# Patient Record
Sex: Male | Born: 1935 | Race: White | Hispanic: No | Smoking: Former smoker
Health system: Southern US, Community
[De-identification: ages and names within clinical notes are randomized; demographics above are authoritative.]

## PROBLEM LIST (undated history)

## (undated) DIAGNOSIS — N189 Chronic kidney disease, unspecified: Secondary | ICD-10-CM

## (undated) DIAGNOSIS — I219 Acute myocardial infarction, unspecified: Secondary | ICD-10-CM

## (undated) DIAGNOSIS — I639 Cerebral infarction, unspecified: Secondary | ICD-10-CM

## (undated) DIAGNOSIS — I10 Essential (primary) hypertension: Secondary | ICD-10-CM

## (undated) DIAGNOSIS — K589 Irritable bowel syndrome without diarrhea: Secondary | ICD-10-CM

## (undated) DIAGNOSIS — E785 Hyperlipidemia, unspecified: Secondary | ICD-10-CM

## (undated) DIAGNOSIS — Z960 Presence of urogenital implants: Secondary | ICD-10-CM

## (undated) DIAGNOSIS — M1990 Unspecified osteoarthritis, unspecified site: Secondary | ICD-10-CM

## (undated) DIAGNOSIS — K219 Gastro-esophageal reflux disease without esophagitis: Secondary | ICD-10-CM

## (undated) DIAGNOSIS — N400 Enlarged prostate without lower urinary tract symptoms: Secondary | ICD-10-CM

## (undated) DIAGNOSIS — K5792 Diverticulitis of intestine, part unspecified, without perforation or abscess without bleeding: Secondary | ICD-10-CM

## (undated) DIAGNOSIS — I442 Atrioventricular block, complete: Secondary | ICD-10-CM

## (undated) DIAGNOSIS — F329 Major depressive disorder, single episode, unspecified: Secondary | ICD-10-CM

## (undated) DIAGNOSIS — I2510 Atherosclerotic heart disease of native coronary artery without angina pectoris: Secondary | ICD-10-CM

## (undated) HISTORY — DX: Acute myocardial infarction, unspecified: I21.9

## (undated) HISTORY — DX: Atherosclerotic heart disease of native coronary artery without angina pectoris: I25.10

## (undated) HISTORY — PX: INSERT / REPLACE / REMOVE PACEMAKER: SUR710

## (undated) HISTORY — DX: Unspecified osteoarthritis, unspecified site: M19.90

## (undated) HISTORY — DX: Diverticulitis of intestine, part unspecified, without perforation or abscess without bleeding: K57.92

## (undated) HISTORY — DX: Hyperlipidemia, unspecified: E78.5

## (undated) HISTORY — DX: Gastro-esophageal reflux disease without esophagitis: K21.9

## (undated) HISTORY — DX: Chronic kidney disease, unspecified: N18.9

## (undated) HISTORY — DX: Irritable bowel syndrome, unspecified: K58.9

## (undated) HISTORY — DX: Essential (primary) hypertension: I10

## (undated) HISTORY — DX: Cerebral infarction, unspecified: I63.9

## (undated) HISTORY — DX: Benign prostatic hyperplasia without lower urinary tract symptoms: N40.0

## (undated) HISTORY — PX: PACEMAKER INSERTION: SHX728

## (undated) HISTORY — DX: Major depressive disorder, single episode, unspecified: F32.9

---

## 1993-11-09 HISTORY — PX: ANGIOPLASTY: SHX39

## 1999-11-10 HISTORY — PX: DOPPLER ECHOCARDIOGRAPHY: SHX263

## 1999-12-07 ENCOUNTER — Inpatient Hospital Stay (HOSPITAL_COMMUNITY): Admission: EM | Admit: 1999-12-07 | Discharge: 1999-12-10 | Payer: Self-pay | Admitting: Cardiology

## 2000-01-15 ENCOUNTER — Ambulatory Visit (HOSPITAL_COMMUNITY): Admission: RE | Admit: 2000-01-15 | Discharge: 2000-01-15 | Payer: Self-pay | Admitting: Cardiology

## 2000-08-13 ENCOUNTER — Encounter: Payer: Self-pay | Admitting: Internal Medicine

## 2000-08-13 ENCOUNTER — Ambulatory Visit (HOSPITAL_COMMUNITY): Admission: RE | Admit: 2000-08-13 | Discharge: 2000-08-13 | Payer: Self-pay | Admitting: Internal Medicine

## 2003-07-11 HISTORY — PX: PROSTATE SURGERY: SHX751

## 2004-03-11 ENCOUNTER — Other Ambulatory Visit: Payer: Self-pay

## 2004-09-25 ENCOUNTER — Ambulatory Visit: Payer: Self-pay | Admitting: Internal Medicine

## 2004-10-03 ENCOUNTER — Ambulatory Visit: Payer: Self-pay

## 2004-10-28 ENCOUNTER — Ambulatory Visit: Payer: Self-pay | Admitting: Internal Medicine

## 2004-10-28 ENCOUNTER — Encounter: Payer: Self-pay | Admitting: Internal Medicine

## 2004-11-09 ENCOUNTER — Encounter: Payer: Self-pay | Admitting: Internal Medicine

## 2004-11-13 ENCOUNTER — Ambulatory Visit: Payer: Self-pay

## 2004-12-09 ENCOUNTER — Ambulatory Visit: Payer: Self-pay

## 2004-12-10 ENCOUNTER — Encounter: Payer: Self-pay | Admitting: Internal Medicine

## 2005-02-13 ENCOUNTER — Ambulatory Visit: Payer: Self-pay | Admitting: Internal Medicine

## 2005-03-20 ENCOUNTER — Ambulatory Visit: Payer: Self-pay | Admitting: Internal Medicine

## 2005-04-24 ENCOUNTER — Ambulatory Visit: Payer: Self-pay | Admitting: Internal Medicine

## 2005-05-29 ENCOUNTER — Ambulatory Visit: Payer: Self-pay | Admitting: Family Medicine

## 2005-06-01 ENCOUNTER — Ambulatory Visit: Payer: Self-pay | Admitting: Internal Medicine

## 2005-06-09 ENCOUNTER — Ambulatory Visit: Payer: Self-pay | Admitting: Internal Medicine

## 2005-07-03 ENCOUNTER — Ambulatory Visit: Payer: Self-pay | Admitting: Internal Medicine

## 2005-07-08 ENCOUNTER — Ambulatory Visit: Payer: Self-pay | Admitting: Internal Medicine

## 2005-07-10 HISTORY — PX: ESOPHAGOGASTRODUODENOSCOPY: SHX1529

## 2005-07-14 ENCOUNTER — Ambulatory Visit: Payer: Self-pay | Admitting: Internal Medicine

## 2005-07-23 ENCOUNTER — Ambulatory Visit: Payer: Self-pay | Admitting: Internal Medicine

## 2005-08-24 ENCOUNTER — Ambulatory Visit: Payer: Self-pay

## 2005-09-04 ENCOUNTER — Ambulatory Visit: Payer: Self-pay | Admitting: Internal Medicine

## 2005-09-24 ENCOUNTER — Ambulatory Visit: Payer: Self-pay | Admitting: Internal Medicine

## 2005-10-16 ENCOUNTER — Ambulatory Visit: Payer: Self-pay

## 2005-10-16 ENCOUNTER — Ambulatory Visit: Payer: Self-pay | Admitting: Internal Medicine

## 2005-11-18 ENCOUNTER — Ambulatory Visit: Payer: Self-pay | Admitting: Internal Medicine

## 2005-12-24 ENCOUNTER — Ambulatory Visit: Payer: Self-pay | Admitting: Internal Medicine

## 2006-03-05 ENCOUNTER — Ambulatory Visit: Payer: Self-pay | Admitting: Internal Medicine

## 2006-03-05 ENCOUNTER — Ambulatory Visit: Payer: Self-pay | Admitting: Cardiology

## 2006-04-01 ENCOUNTER — Ambulatory Visit: Payer: Self-pay | Admitting: Internal Medicine

## 2006-04-16 ENCOUNTER — Ambulatory Visit: Payer: Self-pay | Admitting: Internal Medicine

## 2006-04-29 ENCOUNTER — Ambulatory Visit: Payer: Self-pay | Admitting: Internal Medicine

## 2006-04-30 ENCOUNTER — Ambulatory Visit: Payer: Self-pay | Admitting: Internal Medicine

## 2006-05-04 ENCOUNTER — Ambulatory Visit: Payer: Self-pay | Admitting: Cardiology

## 2006-06-14 ENCOUNTER — Ambulatory Visit: Payer: Self-pay | Admitting: Internal Medicine

## 2006-07-09 ENCOUNTER — Ambulatory Visit: Payer: Self-pay | Admitting: Internal Medicine

## 2006-07-20 ENCOUNTER — Ambulatory Visit: Payer: Self-pay | Admitting: Internal Medicine

## 2006-08-02 ENCOUNTER — Ambulatory Visit: Payer: Self-pay

## 2006-08-04 ENCOUNTER — Ambulatory Visit: Payer: Self-pay | Admitting: Internal Medicine

## 2006-09-24 ENCOUNTER — Ambulatory Visit: Payer: Self-pay | Admitting: Internal Medicine

## 2006-10-07 ENCOUNTER — Ambulatory Visit: Payer: Self-pay | Admitting: Internal Medicine

## 2006-12-31 ENCOUNTER — Ambulatory Visit: Payer: Self-pay | Admitting: Internal Medicine

## 2007-03-15 ENCOUNTER — Ambulatory Visit: Payer: Self-pay | Admitting: Family Medicine

## 2007-03-16 ENCOUNTER — Encounter: Payer: Self-pay | Admitting: Family Medicine

## 2007-03-22 ENCOUNTER — Ambulatory Visit: Payer: Self-pay | Admitting: Family Medicine

## 2007-03-24 ENCOUNTER — Encounter: Payer: Self-pay | Admitting: Internal Medicine

## 2007-04-01 DIAGNOSIS — N401 Enlarged prostate with lower urinary tract symptoms: Secondary | ICD-10-CM

## 2007-04-01 DIAGNOSIS — E785 Hyperlipidemia, unspecified: Secondary | ICD-10-CM

## 2007-04-01 DIAGNOSIS — K5730 Diverticulosis of large intestine without perforation or abscess without bleeding: Secondary | ICD-10-CM | POA: Insufficient documentation

## 2007-04-01 DIAGNOSIS — K219 Gastro-esophageal reflux disease without esophagitis: Secondary | ICD-10-CM | POA: Insufficient documentation

## 2007-04-01 DIAGNOSIS — I25119 Atherosclerotic heart disease of native coronary artery with unspecified angina pectoris: Secondary | ICD-10-CM

## 2007-04-01 DIAGNOSIS — N138 Other obstructive and reflux uropathy: Secondary | ICD-10-CM

## 2007-04-01 DIAGNOSIS — I10 Essential (primary) hypertension: Secondary | ICD-10-CM | POA: Insufficient documentation

## 2007-04-04 DIAGNOSIS — E1129 Type 2 diabetes mellitus with other diabetic kidney complication: Secondary | ICD-10-CM

## 2007-04-05 ENCOUNTER — Ambulatory Visit: Payer: Self-pay | Admitting: Internal Medicine

## 2007-04-07 ENCOUNTER — Ambulatory Visit: Payer: Self-pay | Admitting: Internal Medicine

## 2007-04-07 LAB — CONVERTED CEMR LAB
Cholesterol, target level: 200 mg/dL
HDL goal, serum: 40 mg/dL
LDL Goal: 70 mg/dL

## 2007-04-14 ENCOUNTER — Ambulatory Visit: Payer: Self-pay | Admitting: Internal Medicine

## 2007-05-19 ENCOUNTER — Ambulatory Visit: Payer: Self-pay | Admitting: Internal Medicine

## 2007-05-19 DIAGNOSIS — K589 Irritable bowel syndrome without diarrhea: Secondary | ICD-10-CM

## 2007-06-09 ENCOUNTER — Encounter: Payer: Self-pay | Admitting: Internal Medicine

## 2007-06-13 ENCOUNTER — Ambulatory Visit: Payer: Self-pay

## 2007-06-22 ENCOUNTER — Ambulatory Visit: Payer: Self-pay | Admitting: Internal Medicine

## 2007-06-30 ENCOUNTER — Encounter: Payer: Self-pay | Admitting: Internal Medicine

## 2007-07-26 ENCOUNTER — Ambulatory Visit: Payer: Self-pay | Admitting: Internal Medicine

## 2007-08-30 ENCOUNTER — Encounter: Payer: Self-pay | Admitting: Internal Medicine

## 2007-09-02 ENCOUNTER — Ambulatory Visit: Payer: Self-pay

## 2007-09-12 ENCOUNTER — Ambulatory Visit: Payer: Self-pay | Admitting: Family Medicine

## 2007-11-15 ENCOUNTER — Ambulatory Visit: Payer: Self-pay | Admitting: Internal Medicine

## 2007-12-05 ENCOUNTER — Ambulatory Visit: Payer: Self-pay | Admitting: Internal Medicine

## 2007-12-05 LAB — CONVERTED CEMR LAB
Calcium: 9.5 mg/dL (ref 8.4–10.5)
Chloride: 99 meq/L (ref 96–112)
Cholesterol: 157 mg/dL (ref 0–200)
Direct LDL: 76.6 mg/dL
GFR calc Af Amer: 93 mL/min
Glucose, Bld: 228 mg/dL — ABNORMAL HIGH (ref 70–99)
Microalb Creat Ratio: 263.3 mg/g — ABNORMAL HIGH (ref 0.0–30.0)
Microalb, Ur: 9.9 mg/dL — ABNORMAL HIGH (ref 0.0–1.9)
VLDL: 56 mg/dL — ABNORMAL HIGH (ref 0–40)

## 2007-12-06 LAB — CONVERTED CEMR LAB
Albumin: 4 g/dL (ref 3.5–5.2)
BUN: 12 mg/dL (ref 6–23)
Creatinine, Ser: 1.1 mg/dL (ref 0.4–1.5)
GFR calc Af Amer: 83 mL/min
GFR calc non Af Amer: 69 mL/min
Hgb A1c MFr Bld: 6.3 % — ABNORMAL HIGH (ref 4.6–6.0)
Phosphorus: 3.2 mg/dL (ref 2.3–4.6)
Potassium: 4.9 meq/L (ref 3.5–5.1)
Total CHOL/HDL Ratio: 5.8
Triglycerides: 252 mg/dL (ref 0–149)

## 2008-02-02 ENCOUNTER — Ambulatory Visit: Payer: Self-pay

## 2008-03-05 ENCOUNTER — Ambulatory Visit: Payer: Self-pay | Admitting: Internal Medicine

## 2008-03-15 ENCOUNTER — Encounter: Payer: Self-pay | Admitting: Internal Medicine

## 2008-03-16 ENCOUNTER — Ambulatory Visit: Payer: Self-pay | Admitting: Unknown Physician Specialty

## 2008-03-16 ENCOUNTER — Encounter: Payer: Self-pay | Admitting: Internal Medicine

## 2008-03-16 LAB — HM COLONOSCOPY

## 2008-03-26 ENCOUNTER — Ambulatory Visit: Payer: Self-pay | Admitting: Internal Medicine

## 2008-04-17 ENCOUNTER — Encounter: Payer: Self-pay | Admitting: Internal Medicine

## 2008-04-20 ENCOUNTER — Ambulatory Visit: Payer: Self-pay | Admitting: Family Medicine

## 2008-04-24 ENCOUNTER — Telehealth: Payer: Self-pay | Admitting: Internal Medicine

## 2008-04-24 ENCOUNTER — Encounter: Payer: Self-pay | Admitting: Internal Medicine

## 2008-04-26 ENCOUNTER — Ambulatory Visit: Payer: Self-pay | Admitting: Unknown Physician Specialty

## 2008-04-26 ENCOUNTER — Encounter: Payer: Self-pay | Admitting: Internal Medicine

## 2008-06-01 ENCOUNTER — Ambulatory Visit: Payer: Self-pay | Admitting: Internal Medicine

## 2008-06-01 DIAGNOSIS — L570 Actinic keratosis: Secondary | ICD-10-CM

## 2008-06-04 ENCOUNTER — Ambulatory Visit: Payer: Self-pay | Admitting: Internal Medicine

## 2008-07-04 ENCOUNTER — Encounter: Payer: Self-pay | Admitting: Internal Medicine

## 2008-07-30 ENCOUNTER — Ambulatory Visit: Payer: Self-pay | Admitting: Internal Medicine

## 2008-08-10 ENCOUNTER — Ambulatory Visit: Payer: Self-pay | Admitting: Family Medicine

## 2008-08-17 ENCOUNTER — Emergency Department: Payer: Self-pay | Admitting: Emergency Medicine

## 2008-08-17 ENCOUNTER — Other Ambulatory Visit: Payer: Self-pay

## 2008-08-17 ENCOUNTER — Encounter: Payer: Self-pay | Admitting: Internal Medicine

## 2008-08-24 ENCOUNTER — Ambulatory Visit: Payer: Self-pay | Admitting: Internal Medicine

## 2008-09-17 ENCOUNTER — Ambulatory Visit: Payer: Self-pay | Admitting: Internal Medicine

## 2008-10-03 ENCOUNTER — Encounter: Payer: Self-pay | Admitting: Internal Medicine

## 2008-10-03 ENCOUNTER — Ambulatory Visit: Payer: Self-pay

## 2008-10-22 ENCOUNTER — Ambulatory Visit: Payer: Self-pay | Admitting: Internal Medicine

## 2008-10-23 ENCOUNTER — Ambulatory Visit: Payer: Self-pay | Admitting: Internal Medicine

## 2008-11-12 ENCOUNTER — Ambulatory Visit: Payer: Self-pay | Admitting: Internal Medicine

## 2008-11-19 ENCOUNTER — Ambulatory Visit: Payer: Self-pay | Admitting: Internal Medicine

## 2008-11-21 LAB — CONVERTED CEMR LAB
ALT: 13 units/L (ref 0–53)
AST: 19 units/L (ref 0–37)
BUN: 13 mg/dL (ref 6–23)
Bilirubin, Direct: 0.1 mg/dL (ref 0.0–0.3)
Calcium: 9.4 mg/dL (ref 8.4–10.5)
Direct LDL: 93.9 mg/dL
Eosinophils Relative: 2.4 % (ref 0.0–5.0)
GFR calc Af Amer: 83 mL/min
HCT: 36.3 % — ABNORMAL LOW (ref 39.0–52.0)
Hemoglobin: 12.8 g/dL — ABNORMAL LOW (ref 13.0–17.0)
Monocytes Absolute: 0.5 10*3/uL (ref 0.1–1.0)
Monocytes Relative: 8.1 % (ref 3.0–12.0)
Neutro Abs: 3.9 10*3/uL (ref 1.4–7.7)
Phosphorus: 3.1 mg/dL (ref 2.3–4.6)
Potassium: 4.8 meq/L (ref 3.5–5.1)
RBC: 3.78 M/uL — ABNORMAL LOW (ref 4.22–5.81)
Sodium: 140 meq/L (ref 135–145)
Total CHOL/HDL Ratio: 5.9
Total Protein: 7 g/dL (ref 6.0–8.3)
WBC: 6.6 10*3/uL (ref 4.5–10.5)

## 2008-12-31 ENCOUNTER — Ambulatory Visit: Payer: Self-pay | Admitting: Internal Medicine

## 2008-12-31 ENCOUNTER — Telehealth: Payer: Self-pay | Admitting: Internal Medicine

## 2008-12-31 LAB — CONVERTED CEMR LAB
Nitrite: NEGATIVE
Protein, U semiquant: 30
pH: 6.5

## 2009-01-02 ENCOUNTER — Telehealth: Payer: Self-pay | Admitting: Internal Medicine

## 2009-01-03 ENCOUNTER — Encounter: Payer: Self-pay | Admitting: Internal Medicine

## 2009-01-03 ENCOUNTER — Inpatient Hospital Stay: Payer: Self-pay | Admitting: Internal Medicine

## 2009-01-05 ENCOUNTER — Encounter: Payer: Self-pay | Admitting: Internal Medicine

## 2009-01-07 LAB — HM DIABETES EYE EXAM

## 2009-01-09 ENCOUNTER — Telehealth: Payer: Self-pay | Admitting: Internal Medicine

## 2009-01-10 ENCOUNTER — Telehealth: Payer: Self-pay | Admitting: Internal Medicine

## 2009-01-14 ENCOUNTER — Ambulatory Visit: Payer: Self-pay | Admitting: Internal Medicine

## 2009-01-14 DIAGNOSIS — I6990 Unspecified sequelae of unspecified cerebrovascular disease: Secondary | ICD-10-CM

## 2009-01-16 ENCOUNTER — Encounter: Payer: Self-pay | Admitting: Internal Medicine

## 2009-01-17 ENCOUNTER — Encounter: Payer: Self-pay | Admitting: Internal Medicine

## 2009-01-21 ENCOUNTER — Ambulatory Visit: Payer: Self-pay | Admitting: Internal Medicine

## 2009-01-25 ENCOUNTER — Ambulatory Visit: Payer: Self-pay | Admitting: Vascular Surgery

## 2009-01-30 ENCOUNTER — Encounter: Payer: Self-pay | Admitting: Internal Medicine

## 2009-02-05 ENCOUNTER — Encounter: Payer: Self-pay | Admitting: Internal Medicine

## 2009-02-06 ENCOUNTER — Encounter: Payer: Self-pay | Admitting: Internal Medicine

## 2009-02-07 ENCOUNTER — Ambulatory Visit: Payer: Self-pay | Admitting: Vascular Surgery

## 2009-02-11 ENCOUNTER — Ambulatory Visit: Payer: Self-pay | Admitting: Internal Medicine

## 2009-02-13 LAB — CONVERTED CEMR LAB
ALT: 14 units/L (ref 0–53)
Basophils Absolute: 0 10*3/uL (ref 0.0–0.1)
Basophils Relative: 0.6 % (ref 0.0–3.0)
Bilirubin, Direct: 0.1 mg/dL (ref 0.0–0.3)
Calcium: 9.9 mg/dL (ref 8.4–10.5)
Direct LDL: 87.8 mg/dL
Eosinophils Relative: 2.4 % (ref 0.0–5.0)
GFR calc non Af Amer: 68.3 mL/min (ref >60)
Glucose, Bld: 237 mg/dL — ABNORMAL HIGH (ref 70–99)
HCT: 37 % — ABNORMAL LOW (ref 39.0–52.0)
HDL: 29 mg/dL — ABNORMAL LOW (ref >39.00)
Hemoglobin: 12.7 g/dL — ABNORMAL LOW (ref 13.0–17.0)
Lymphocytes Relative: 24.3 % (ref 12.0–46.0)
Lymphs Abs: 1.8 10*3/uL (ref 0.7–4.0)
Monocytes Relative: 5.9 % (ref 3.0–12.0)
Neutro Abs: 5.1 10*3/uL (ref 1.4–7.7)
Potassium: 5.4 meq/L — ABNORMAL HIGH (ref 3.5–5.1)
RBC: 3.82 M/uL — ABNORMAL LOW (ref 4.22–5.81)
Sodium: 139 meq/L (ref 135–145)
Total Bilirubin: 0.7 mg/dL (ref 0.3–1.2)
Triglycerides: 305 mg/dL — ABNORMAL HIGH (ref 0.0–149.0)
VLDL: 61 mg/dL — ABNORMAL HIGH (ref 0.0–40.0)
WBC: 7.5 10*3/uL (ref 4.5–10.5)

## 2009-02-14 ENCOUNTER — Inpatient Hospital Stay: Payer: Self-pay | Admitting: Vascular Surgery

## 2009-02-14 ENCOUNTER — Encounter: Payer: Self-pay | Admitting: Internal Medicine

## 2009-02-15 ENCOUNTER — Encounter: Payer: Self-pay | Admitting: Internal Medicine

## 2009-02-25 ENCOUNTER — Ambulatory Visit: Payer: Self-pay | Admitting: Internal Medicine

## 2009-02-25 LAB — CONVERTED CEMR LAB: Potassium: 5 meq/L (ref 3.5–5.1)

## 2009-04-03 DIAGNOSIS — I442 Atrioventricular block, complete: Secondary | ICD-10-CM

## 2009-04-16 ENCOUNTER — Encounter: Payer: Self-pay | Admitting: Internal Medicine

## 2009-04-17 ENCOUNTER — Ambulatory Visit: Payer: Self-pay | Admitting: Internal Medicine

## 2009-04-18 DIAGNOSIS — M479 Spondylosis, unspecified: Secondary | ICD-10-CM | POA: Insufficient documentation

## 2009-04-18 LAB — CONVERTED CEMR LAB
Alkaline Phosphatase: 81 units/L (ref 39–117)
Basophils Relative: 0.1 % (ref 0.0–3.0)
Bilirubin, Direct: 0.1 mg/dL (ref 0.0–0.3)
Chloride: 106 meq/L (ref 96–112)
Eosinophils Absolute: 0.2 10*3/uL (ref 0.0–0.7)
Glucose, Bld: 285 mg/dL — ABNORMAL HIGH (ref 70–99)
Lymphocytes Relative: 11.5 % — ABNORMAL LOW (ref 12.0–46.0)
MCHC: 34.9 g/dL (ref 30.0–36.0)
MCV: 95.7 fL (ref 78.0–100.0)
Monocytes Absolute: 0.7 10*3/uL (ref 0.1–1.0)
Neutrophils Relative %: 80 % — ABNORMAL HIGH (ref 43.0–77.0)
Phosphorus: 3.3 mg/dL (ref 2.3–4.6)
Platelets: 200 10*3/uL (ref 150.0–400.0)
Potassium: 4.8 meq/L (ref 3.5–5.1)
RBC: 4.02 M/uL — ABNORMAL LOW (ref 4.22–5.81)
Sodium: 141 meq/L (ref 135–145)
TSH: 0.81 microintl units/mL (ref 0.35–5.50)
Total Bilirubin: 0.6 mg/dL (ref 0.3–1.2)
Total CK: 53 units/L (ref 7–232)
WBC: 11.2 10*3/uL — ABNORMAL HIGH (ref 4.5–10.5)

## 2009-05-20 ENCOUNTER — Encounter: Payer: Self-pay | Admitting: Internal Medicine

## 2009-05-20 ENCOUNTER — Ambulatory Visit: Payer: Self-pay | Admitting: Internal Medicine

## 2009-06-24 ENCOUNTER — Ambulatory Visit: Payer: Self-pay | Admitting: Internal Medicine

## 2009-06-24 ENCOUNTER — Telehealth: Payer: Self-pay | Admitting: Internal Medicine

## 2009-06-24 DIAGNOSIS — R5381 Other malaise: Secondary | ICD-10-CM

## 2009-06-24 DIAGNOSIS — R5383 Other fatigue: Secondary | ICD-10-CM

## 2009-06-25 LAB — CONVERTED CEMR LAB
ALT: 9 units/L (ref 0–53)
Albumin: 4.1 g/dL (ref 3.5–5.2)
BUN: 36 mg/dL — ABNORMAL HIGH (ref 6–23)
CO2: 31 meq/L (ref 19–32)
Calcium: 9.8 mg/dL (ref 8.4–10.5)
Chloride: 98 meq/L (ref 96–112)
Creatinine, Ser: 1.5 mg/dL (ref 0.4–1.5)
Eosinophils Absolute: 0.2 10*3/uL (ref 0.0–0.7)
Eosinophils Relative: 2.4 % (ref 0.0–5.0)
Free T4: 0.8 ng/dL (ref 0.6–1.6)
HCT: 38.2 % — ABNORMAL LOW (ref 39.0–52.0)
Lymphs Abs: 1.8 10*3/uL (ref 0.7–4.0)
MCHC: 33.9 g/dL (ref 30.0–36.0)
MCV: 95.1 fL (ref 78.0–100.0)
Monocytes Absolute: 0.5 10*3/uL (ref 0.1–1.0)
Platelets: 181 10*3/uL (ref 150.0–400.0)
RDW: 13.1 % (ref 11.5–14.6)
Sed Rate: 32 mm/hr — ABNORMAL HIGH (ref 0–22)
Total Bilirubin: 0.8 mg/dL (ref 0.3–1.2)
WBC: 8 10*3/uL (ref 4.5–10.5)

## 2009-07-09 ENCOUNTER — Ambulatory Visit: Payer: Self-pay | Admitting: Internal Medicine

## 2009-07-29 ENCOUNTER — Inpatient Hospital Stay: Payer: Self-pay | Admitting: Internal Medicine

## 2009-07-31 ENCOUNTER — Encounter: Payer: Self-pay | Admitting: Internal Medicine

## 2009-08-07 ENCOUNTER — Ambulatory Visit: Payer: Self-pay | Admitting: Internal Medicine

## 2009-08-15 ENCOUNTER — Ambulatory Visit: Payer: Self-pay | Admitting: Internal Medicine

## 2009-08-26 ENCOUNTER — Ambulatory Visit: Payer: Self-pay | Admitting: Internal Medicine

## 2009-09-05 ENCOUNTER — Ambulatory Visit: Payer: Self-pay | Admitting: Internal Medicine

## 2009-11-06 ENCOUNTER — Encounter: Payer: Self-pay | Admitting: Internal Medicine

## 2009-11-21 ENCOUNTER — Ambulatory Visit: Payer: Self-pay | Admitting: Internal Medicine

## 2009-12-06 ENCOUNTER — Ambulatory Visit: Payer: Self-pay | Admitting: Internal Medicine

## 2009-12-09 ENCOUNTER — Telehealth: Payer: Self-pay | Admitting: Internal Medicine

## 2009-12-09 LAB — CONVERTED CEMR LAB
ALT: 9 units/L (ref 0–53)
AST: 17 units/L (ref 0–37)
Chloride: 99 meq/L (ref 96–112)
Creatinine, Ser: 1.11 mg/dL (ref 0.40–1.50)
Hgb A1c MFr Bld: 6.5 % — ABNORMAL HIGH (ref 4.6–6.1)
Sodium: 140 meq/L (ref 135–145)
TSH: 1.791 microintl units/mL (ref 0.350–4.500)
Total Bilirubin: 0.5 mg/dL (ref 0.3–1.2)
Total CHOL/HDL Ratio: 4.8
VLDL: 71 mg/dL — ABNORMAL HIGH (ref 0–40)

## 2010-02-12 ENCOUNTER — Inpatient Hospital Stay: Payer: Self-pay | Admitting: Internal Medicine

## 2010-02-17 ENCOUNTER — Encounter: Payer: Self-pay | Admitting: Internal Medicine

## 2010-02-20 ENCOUNTER — Telehealth: Payer: Self-pay | Admitting: Internal Medicine

## 2010-03-04 ENCOUNTER — Encounter: Payer: Self-pay | Admitting: Internal Medicine

## 2010-03-06 ENCOUNTER — Encounter: Payer: Self-pay | Admitting: Internal Medicine

## 2010-03-27 ENCOUNTER — Encounter: Payer: Self-pay | Admitting: Internal Medicine

## 2010-04-24 ENCOUNTER — Ambulatory Visit: Payer: Self-pay | Admitting: Internal Medicine

## 2010-04-24 DIAGNOSIS — H01009 Unspecified blepharitis unspecified eye, unspecified eyelid: Secondary | ICD-10-CM | POA: Insufficient documentation

## 2010-04-24 DIAGNOSIS — L219 Seborrheic dermatitis, unspecified: Secondary | ICD-10-CM

## 2010-04-28 LAB — CONVERTED CEMR LAB
Albumin: 4.5 g/dL (ref 3.5–5.2)
BUN: 19 mg/dL (ref 6–23)
Basophils Absolute: 0 10*3/uL (ref 0.0–0.1)
Creatinine, Ser: 1.2 mg/dL (ref 0.4–1.5)
Eosinophils Absolute: 0.4 10*3/uL (ref 0.0–0.7)
Glucose, Bld: 172 mg/dL — ABNORMAL HIGH (ref 70–99)
Lymphocytes Relative: 24 % (ref 12.0–46.0)
MCHC: 34.3 g/dL (ref 30.0–36.0)
Neutro Abs: 4.9 10*3/uL (ref 1.4–7.7)
Neutrophils Relative %: 64.3 % (ref 43.0–77.0)
Phosphorus: 2.5 mg/dL (ref 2.3–4.6)
RDW: 13.5 % (ref 11.5–14.6)

## 2010-05-02 ENCOUNTER — Ambulatory Visit: Payer: Self-pay | Admitting: Internal Medicine

## 2010-05-20 ENCOUNTER — Encounter: Payer: Self-pay | Admitting: Internal Medicine

## 2010-05-20 ENCOUNTER — Telehealth: Payer: Self-pay | Admitting: Internal Medicine

## 2010-06-04 ENCOUNTER — Ambulatory Visit: Payer: Self-pay | Admitting: Internal Medicine

## 2010-06-17 ENCOUNTER — Ambulatory Visit: Payer: Self-pay | Admitting: Otolaryngology

## 2010-06-27 ENCOUNTER — Telehealth: Payer: Self-pay | Admitting: Internal Medicine

## 2010-08-07 ENCOUNTER — Ambulatory Visit: Payer: Self-pay | Admitting: Internal Medicine

## 2010-08-07 DIAGNOSIS — J069 Acute upper respiratory infection, unspecified: Secondary | ICD-10-CM | POA: Insufficient documentation

## 2010-08-27 ENCOUNTER — Encounter: Payer: Self-pay | Admitting: Internal Medicine

## 2010-09-01 ENCOUNTER — Ambulatory Visit: Payer: Self-pay | Admitting: Internal Medicine

## 2010-09-03 LAB — CONVERTED CEMR LAB
ALT: 10 units/L (ref 0–53)
AST: 21 units/L (ref 0–37)
Basophils Absolute: 0.1 10*3/uL (ref 0.0–0.1)
Basophils Relative: 1.5 % (ref 0.0–3.0)
Calcium: 9.6 mg/dL (ref 8.4–10.5)
Cholesterol: 161 mg/dL (ref 0–200)
Creatinine, Ser: 1.3 mg/dL (ref 0.4–1.5)
Direct LDL: 80.8 mg/dL
Eosinophils Absolute: 0.2 10*3/uL (ref 0.0–0.7)
Glucose, Bld: 190 mg/dL — ABNORMAL HIGH (ref 70–99)
Lymphocytes Relative: 28.8 % (ref 12.0–46.0)
MCHC: 34.3 g/dL (ref 30.0–36.0)
Neutrophils Relative %: 60.4 % (ref 43.0–77.0)
Phosphorus: 2.8 mg/dL (ref 2.3–4.6)
Platelets: 141 10*3/uL — ABNORMAL LOW (ref 150.0–400.0)
RBC: 3.86 M/uL — ABNORMAL LOW (ref 4.22–5.81)
RDW: 14.5 % (ref 11.5–14.6)
Sodium: 137 meq/L (ref 135–145)
TSH: 1.18 microintl units/mL (ref 0.35–5.50)
Total Bilirubin: 0.3 mg/dL (ref 0.3–1.2)
Triglycerides: 352 mg/dL — ABNORMAL HIGH (ref 0.0–149.0)

## 2010-09-05 ENCOUNTER — Encounter: Payer: Self-pay | Admitting: Internal Medicine

## 2010-11-05 ENCOUNTER — Encounter (INDEPENDENT_AMBULATORY_CARE_PROVIDER_SITE_OTHER): Payer: Self-pay | Admitting: *Deleted

## 2010-11-09 DIAGNOSIS — F32A Depression, unspecified: Secondary | ICD-10-CM

## 2010-11-09 HISTORY — DX: Depression, unspecified: F32.A

## 2010-11-20 ENCOUNTER — Telehealth (INDEPENDENT_AMBULATORY_CARE_PROVIDER_SITE_OTHER): Payer: Self-pay | Admitting: Physician Assistant

## 2010-11-20 ENCOUNTER — Telehealth (INDEPENDENT_AMBULATORY_CARE_PROVIDER_SITE_OTHER): Payer: Self-pay | Admitting: *Deleted

## 2010-12-01 ENCOUNTER — Ambulatory Visit
Admission: RE | Admit: 2010-12-01 | Discharge: 2010-12-01 | Payer: Self-pay | Source: Home / Self Care | Attending: Internal Medicine | Admitting: Internal Medicine

## 2010-12-01 ENCOUNTER — Encounter: Payer: Self-pay | Admitting: Internal Medicine

## 2010-12-01 DIAGNOSIS — I6350 Cerebral infarction due to unspecified occlusion or stenosis of unspecified cerebral artery: Secondary | ICD-10-CM | POA: Insufficient documentation

## 2010-12-01 LAB — CONVERTED CEMR LAB
BUN: 16 mg/dL (ref 6–23)
Calcium: 9.6 mg/dL (ref 8.4–10.5)
Chloride: 101 meq/L (ref 96–112)
Creatinine, Ser: 1.13 mg/dL (ref 0.40–1.50)

## 2010-12-03 ENCOUNTER — Ambulatory Visit: Payer: Self-pay | Admitting: Internal Medicine

## 2010-12-03 ENCOUNTER — Encounter: Payer: Self-pay | Admitting: Internal Medicine

## 2010-12-05 ENCOUNTER — Ambulatory Visit
Admission: RE | Admit: 2010-12-05 | Discharge: 2010-12-05 | Payer: Self-pay | Source: Home / Self Care | Attending: Internal Medicine | Admitting: Internal Medicine

## 2010-12-05 LAB — HM DIABETES FOOT EXAM

## 2010-12-09 ENCOUNTER — Telehealth (INDEPENDENT_AMBULATORY_CARE_PROVIDER_SITE_OTHER): Payer: Self-pay | Admitting: *Deleted

## 2010-12-09 NOTE — Letter (Signed)
Summary: [facility]  [facility]   Imported By: Lanelle Bal 11/22/2009 14:07:37  _____________________________________________________________________  External Attachment:    Type:   Image     Comment:   External Document  Appended Document: [facility] doing well after left CEA

## 2010-12-09 NOTE — Assessment & Plan Note (Signed)
Summary: SPOT BEHIND EAR/DLO   Vital Signs:  Patient profile:   77 year old male Height:      67 inches Weight:      135.25 pounds BMI:     21.26 Temp:     98 degrees F oral Pulse rate:   64 / minute Pulse rhythm:   regular BP sitting:   150 / 76  (left arm) Cuff size:   regular  Vitals Entered By: Lewanda Rife LPN (April 24, 2010 2:46 PM) CC: spot behind left ear   History of Present Illness: Hospitalized in April for urinary retention then went to rehab--[facility] Eventually able to void better---doing okay now still with some hesitancy---and doesn't empty well  New area behind left ear May just be dry skin noticed about 1 week ago thinks it looks like dry skin trying soap and cleaning and peroxide not sure if it is helping some itching  bowels moving "fair" no blood  No heart problems no chest pain  No SOB No sig edema  checks sugars daily in spells run 87-189 Most under 140 No sig hypoglycemia  Allergies (verified): No Known Drug Allergies  Past History:  Past medical, surgical, family and social histories (including risk factors) reviewed for relevance to current acute and chronic problems.  Past Medical History: Reviewed history from 06/24/2009 and no changes required. Coronary artery disease-----------------------------------------Dr Graciela Husbands Diverticulosis, colon GERD Hyperlipidemia Hypertension Benign prostatic hypertrophy---------------------------------Dr Artis Flock NIDDM with nephropathy DJD--lumbar spine Irritable bowel syndrome CVA--2/10  Past Surgical History: Reviewed history from 08/07/2009 and no changes required. Angioplasty  1995 Syncope- echo nl (LV EF  55%) 11/1999 Pacer/cath  (ef 65%)  1/01 -  guidant Discovery II DR pulse generator Abd pain 09/2002  H  pylori positive 05/2002 TUNA (Dr Aldean Ast) 07/2003 EGD neg. 07/2005 ABIs normal 10/2005  Family History: Reviewed history from 02/11/2009 and no changes required. Mom died of  old age Dad died of old age 37 brother died of accident Sister with DM  Social History: Reviewed history from 12/31/2008 and no changes required. Widowed--5 children Lives with son and DIL Former Smoker Alcohol use-no--heavy in past Retired--drove Merchant navy officer for [employer]  Review of Systems       weight fairly stable sleeping okay Gets redness in eyes and glaze along eyelid  Physical Exam  General:  alert.  NAD Eyes:  right lower lid slightly everted with mild redness and inflammation Neck:  supple and no masses.   Lungs:  normal respiratory effort and normal breath sounds.   Heart:  normal rate, regular rhythm, no murmur, and no gallop.   Abdomen:  soft and non-tender.   Skin:  scaling with redness behind left ear no scalp issues Psych:  normally interactive, good eye contact, not anxious appearing, and not depressed appearing.     Impression & Recommendations:  Problem # 1:  SEBORRHEIC DERMATITIS (ICD-690.10) Assessment New will use steroid cream  Problem # 2:  BLEPHARITIS (ICD-373.00) Assessment: New bleph 10 ointment to eye doctor if persists  Problem # 3:  DIABETES MELLITUS, TYPE II, WITH RENAL COMPLICATIONS (ICD-250.40) Assessment: Unchanged  will check labs  His updated medication list for this problem includes:    Lotensin 20 Mg Tabs (Benazepril hcl) .Marland Kitchen... 1 tab daily for high blood pressure    Glucotrol Xl 10 Mg Tb24 (Glipizide) .Marland Kitchen... 1 by mouth qam    Aspirin 81 Mg Tbec (Aspirin) .Marland Kitchen... 1 by mouth daily    Glucophage 37000 Mg Tabs (Metformin hcl) .Marland KitchenMarland KitchenMarland KitchenMarland Kitchen  1 by mouth two times a day  Labs Reviewed: Creat: 1.11 (12/06/2009)     Last Eye Exam: no retinopathy (01/07/2009) Reviewed HgBA1c results: 6.5 (12/06/2009)  6.4 (02/11/2009)  Orders: TLB-A1C / Hgb A1C (Glycohemoglobin) (83036-A1C)  Problem # 4:  HYPERTENSION (ICD-401.9) Assessment: Unchanged  BP has been okay due for labs  His updated medication list for this problem includes:     Lotensin 20 Mg Tabs (Benazepril hcl) .Marland Kitchen... 1 tab daily for high blood pressure    Atenolol 50 Mg Tabs (Atenolol) .Marland Kitchen... 1 daily  BP today: 150/76 Prior BP: 168/80 (12/06/2009)  Prior 10 Yr Risk Heart Disease: N/A (04/07/2007)  Labs Reviewed: K+: 4.2 (12/06/2009) Creat: : 1.11 (12/06/2009)   Chol: 178 (12/06/2009)   HDL: 37 (12/06/2009)   LDL: 70 (12/06/2009)   TG: 356 (12/06/2009)  Orders: TLB-Renal Function Panel (80069-RENAL) TLB-CBC Platelet - w/Differential (85025-CBCD) Venipuncture (16109)  Problem # 5:  BENIGN PROSTATIC HYPERTROPHY (ICD-600.00) Assessment: Unchanged follows with Dr Artis Flock  Problem # 6:  CEREBROVASCULAR ACCIDENT, LATE EFFECTS (ICD-438.9) Assessment: Comment Only handicapped permit request signed  His updated medication list for this problem includes:    Aspirin 81 Mg Tbec (Aspirin) .Marland Kitchen... 1 by mouth daily    Plavix 75 Mg Tabs (Clopidogrel bisulfate) .Marland Kitchen... Take 1 tablet by mouth once daily  Complete Medication List: 1)  Tamsulosin Hcl 0.4 Mg Caps (Tamsulosin hcl) .Marland Kitchen.. 1 cap daily to ease urination 2)  Lotensin 20 Mg Tabs (Benazepril hcl) .Marland Kitchen.. 1 tab daily for high blood pressure 3)  Glucotrol Xl 10 Mg Tb24 (Glipizide) .Marland Kitchen.. 1 by mouth qam 4)  Aspirin 81 Mg Tbec (Aspirin) .Marland Kitchen.. 1 by mouth daily 5)  Glucophage 1000 Mg Tabs (Metformin hcl) .Marland Kitchen.. 1 by mouth two times a day 6)  Pravachol 20 Mg Tabs (Pravastatin sodium) .Marland Kitchen.. 1 by mouth daily 7)  Imdur 30 Mg Tb24 (Isosorbide mononitrate) .Marland Kitchen.. 1 by mouth daily 8)  Miralax Powd (Polyethylene glycol 3350) .... As needed 9)  Metamucil Caps (Psyllium caps) .Marland Kitchen.. 1 tbl spon daily 10)  Finasteride 5 Mg Tabs (Finasteride) .Marland Kitchen.. 1 by mouth daily 11)  Omeprazole 20 Mg Cpdr (Omeprazole) .Marland Kitchen.. 1 before breakfast and 1 at bedtime 12)  Atenolol 50 Mg Tabs (Atenolol) .Marland Kitchen.. 1 daily 13)  Clarinex 5 Mg Tabs (Desloratadine) .Marland Kitchen.. 1 once daily as needed by mouth 14)  Lorazepam 0.5 Mg Tabs (Lorazepam) .Marland Kitchen.. 1-2  at bedtime to help sleep and  abdominal pain 15)  Plavix 75 Mg Tabs (Clopidogrel bisulfate) .... Take 1 tablet by mouth once daily 16)  Colace 100 Mg Caps (Docusate sodium) .... Take 1 capsule by mouth two times a day 17)  Senna S 8.6-50 Mg Tabs (Sennosides-docusate sodium) .... 2 tabs by mouth daily as needed if no stool in 24 hours 18)  Magnesium Citrate 1.745 Gm/22ml Soln (Magnesium citrate) .... 4-8 ounces by mouth every 3rd day if no stool 19)  Multivitamins Tabs (Multiple vitamin) .Marland Kitchen.. 1 by mouth daily 20)  Lifescan Unistik Ii Lancets Misc (Lancets) .... Please use fine point, use as directed once daily 21)  Onetouch Ultra Test Strp (Glucose blood) .... Use once daily 22)  Sulfacetamide Sodium 10 % Soln (Sulfacetamide sodium) .... 2 drops in each eye three times a day for 5 days 23)  Triamcinolone Acetonide 0.1 % Crea (Triamcinolone acetonide) .... Apply to rash behind ear three times a day as needed  Patient Instructions: 1)  Please schedule a follow-up appointment in 3 months .  Prescriptions: TRIAMCINOLONE ACETONIDE 0.1 %  CREA (TRIAMCINOLONE ACETONIDE) apply to rash behind ear three times a day as needed  #30gm x 3   Entered and Authorized by:   Cindee Salt MD   Signed by:   Cindee Salt MD on 04/24/2010   Method used:   Electronically to        [facility] [phone].* (retail)       [address]       [place], [place]  [address]       Ph: [phone]       Fax: [phone]   RxID:   [id number] SULFACETAMIDE SODIUM 10 % SOLN (SULFACETAMIDE SODIUM) 2 drops in each eye three times a day for 5 days  #1 bottle x 0   Entered and Authorized by:   Cindee Salt MD   Signed by:   Cindee Salt MD on 04/24/2010   Method used:   Electronically to        [facility] [phone].* (retail)       [address]       [place], [place]  [address]       Ph: [phone]       Fax: [phone]   RxID:   [id number]   Current  Allergies (reviewed today): No known allergies

## 2010-12-09 NOTE — Assessment & Plan Note (Signed)
Summary: 2:30 1 MTH F/UP/JRR   Vital Signs:  Patient profile:   77 year old male Weight:      134 pounds Temp:     98.3 degrees F oral Pulse rate:   80 / minute Pulse rhythm:   regular BP sitting:   128 / 70  (left arm) Cuff size:   regular  Vitals Entered By: Sydell Axon LPN (September 01, 2010 2:31 PM) CC: One month follow-up   History of Present Illness: IN with son  Stomach seems better Bowels have been more regular---actually having "some blow outs" so he cut down to daily instead of two times a day   Eye doctor does have surgery planned to correct lid eversion Needs to be off aspirin for 2 weeks and the plavix 3 days before the surgery  Has some dry mouth at night drinking water helps some Urine flow remains very slow  No chest pain  No SOB  does check sugars have been okay no hypoglycemic reactions  Allergies: No Known Drug Allergies  Past History:  Past medical, surgical, family and social histories (including risk factors) reviewed for relevance to current acute and chronic problems.  Past Medical History: Reviewed history from 06/24/2009 and no changes required. Coronary artery disease-----------------------------------------Dr Graciela Husbands Diverticulosis, colon GERD Hyperlipidemia Hypertension Benign prostatic hypertrophy---------------------------------Dr Artis Flock NIDDM with nephropathy DJD--lumbar spine Irritable bowel syndrome CVA--2/10  Past Surgical History: Reviewed history from 08/07/2009 and no changes required. Angioplasty  1995 Syncope- echo nl (LV EF  55%) 11/1999 Pacer/cath  (ef 65%)  1/01 -  guidant Discovery II DR pulse generator Abd pain 09/2002  H  pylori positive 05/2002 TUNA (Dr Aldean Ast) 07/2003 EGD neg. 07/2005 ABIs normal 10/2005  Family History: Reviewed history from 02/11/2009 and no changes required. Mom died of old age Dad died of old age 94 brother died of accident Sister with DM  Social History: Reviewed history from  12/31/2008 and no changes required. 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 Not awakening with pain now  Physical Exam  General:  alert.  NAD Neck:  supple, no masses, no thyromegaly, no carotid bruits, and no cervical lymphadenopathy.   Lungs:  normal respiratory effort, no intercostal retractions, no accessory muscle use, and normal breath sounds.   Heart:  normal rate, regular rhythm, and no gallop.   Soft systolic murmur at base Abdomen:  soft, non-tender, and no masses.   Extremities:  no sig edema Neurologic:  some trouble getting up on table Psych:  normally interactive, good eye contact, not anxious appearing, and not depressed appearing.     Impression & Recommendations:  Problem # 1:  IRRITABLE BOWEL SYNDROME (ICD-564.1) Assessment Improved bowels have been regular will continue the daily or two times a day miralax depending on how he is doing  Problem # 2:  BENIGN PROSTATIC HYPERTROPHY (ICD-600.00) Assessment: Unchanged very slow despite double therapy No sig changes though  Problem # 3:  HYPERTENSION (ICD-401.9) Assessment: Unchanged  BP fine no changes needed  His updated medication list for this problem includes:    Lotensin 20 Mg Tabs (Benazepril hcl) .Marland Kitchen... 1 tab daily for high blood pressure  Atenolol 50 Mg Tabs (Atenolol) .Marland Kitchen... 1 daily  BP today: 128/70 Prior BP: 110/66 (08/07/2010)  Prior 10 Yr Risk Heart Disease: N/A (04/07/2007)  Labs Reviewed: K+: 4.9 (04/24/2010) Creat: : 1.2 (04/24/2010)   Chol: 178 (12/06/2009)   HDL: 37 (12/06/2009)   LDL: 70 (12/06/2009)   TG: 356 (12/06/2009)  Orders: TLB-Renal Function  Panel (80069-RENAL) TLB-CBC Platelet - w/Differential (85025-CBCD) TLB-TSH (Thyroid Stimulating Hormone) (84443-TSH) Venipuncture (03474)  Problem # 4:  DIABETES MELLITUS, TYPE II, WITH RENAL COMPLICATIONS (ICD-250.40) Assessment: Unchanged  due for A1c If this is still under 7%, I would stop the glucotrol discussed that fastings should be below 200 and hopefully lower   His updated medication list for this problem includes:    Lotensin 20 Mg Tabs (Benazepril hcl) .Marland Kitchen... 1 tab daily for high blood pressure    Glucotrol Xl 10 Mg Tb24 (Glipizide) .Marland Kitchen... 1 by mouth before breakfast    Aspirin 81 Mg Tbec (Aspirin) .Marland Kitchen... 1 by mouth daily    Glucophage 1000 Mg Tabs (Metformin hcl) .Marland Kitchen... 1 by mouth two times a day  Labs Reviewed: Creat: 1.2 (04/24/2010)     Last Eye Exam: no retinopathy (01/07/2009) Reviewed HgBA1c results: 6.1 (04/24/2010)  6.5 (12/06/2009)  Orders: TLB-A1C / Hgb A1C (Glycohemoglobin) (83036-A1C)  Complete Medication List: 1)  Tamsulosin Hcl 0.4 Mg Caps (Tamsulosin hcl) .Marland Kitchen.. 1 cap daily to ease urination 2)  Lotensin 20 Mg Tabs (Benazepril hcl) .Marland Kitchen.. 1 tab daily for high blood pressure 3)  Glucotrol Xl 10 Mg Tb24 (Glipizide) .Marland Kitchen.. 1 by mouth before breakfast 4)  Aspirin 81 Mg Tbec (Aspirin) .Marland Kitchen.. 1 by mouth daily 5)  Glucophage 1000 Mg Tabs (Metformin hcl) .Marland Kitchen.. 1 by mouth two times a day 6)  Pravachol 20 Mg Tabs (Pravastatin sodium) .Marland Kitchen.. 1 by mouth daily 7)  Imdur 30 Mg Tb24 (Isosorbide mononitrate) .Marland Kitchen.. 1 by mouth daily 8)  Miralax Powd (Polyethylene glycol 3350) .Marland Kitchen.. 1 capful with water one to two times a day for constipation 9)  Finasteride 5 Mg Tabs (Finasteride) .Marland Kitchen.. 1 by mouth daily 10)  Omeprazole 20 Mg Cpdr (Omeprazole) .Marland Kitchen.. 1 before breakfast and 1 at bedtime 11)  Atenolol 50 Mg Tabs (Atenolol) .Marland Kitchen.. 1 daily 12)  Clarinex 5 Mg Tabs (Desloratadine) .Marland Kitchen.. 1 once daily as needed by mouth 13)  Lorazepam 0.5 Mg Tabs (Lorazepam) .Marland Kitchen.. 1-2  at bedtime to help sleep and  abdominal pain 14)  Plavix 75 Mg Tabs (Clopidogrel bisulfate) .... Take 1 tablet by mouth once daily 15)  Multivitamins Tabs (Multiple vitamin) .Marland Kitchen.. 1 by mouth daily 16)  Triamcinolone Acetonide 0.1 % Crea (Triamcinolone acetonide) .... Apply to rash behind ear three times a day as needed 17)  Lifescan Unistik Ii Lancets Misc (Lancets) .... Please use fine point, use as directed once daily 18)  Onetouch Ultra Test Strp (Glucose blood) .... Use once daily  Other Orders: TLB-Lipid Panel (80061-LIPID) TLB-Hepatic/Liver Function Pnl (80076-HEPATIC)  Patient Instructions: 1)  Please schedule a follow-up appointment in 3 months .  Prescriptions: ATENOLOL 50 MG  TABS (ATENOLOL) 1 daily  #90 x 3   Entered and Authorized by:   Cindee Salt MD   Signed by:   Cindee Salt MD on 09/01/2010   Method used:   Electronically to        [facility] (mail-order)       [address] [place] [address]  [address]       Ph:  [phone]       Fax: [phone]   RxID:   [id number] IMDUR 30 MG TB24 (ISOSORBIDE MONONITRATE) 1 by mouth daily  #90 x 3   Entered and Authorized by:   Cindee Salt MD   Signed by:   Cindee Salt MD on 09/01/2010   Method used:   Electronically to        [facility]* (mail-order)       [address]       [place], [place]  [address]       Ph: [phone]       Fax: [phone]   RxID:   [id number]    Orders Added: 1)  TLB-A1C / Hgb A1C (Glycohemoglobin) [83036-A1C] 2)  TLB-Lipid Panel [80061-LIPID] 3)  TLB-Hepatic/Liver Function Pnl [80076-HEPATIC] 4)  TLB-Renal Function Panel [80069-RENAL] 5)  TLB-CBC Platelet - w/Differential [85025-CBCD] 6)  TLB-TSH (Thyroid Stimulating Hormone) [84443-TSH] 7)  Venipuncture [32951]    Current Allergies (reviewed today): No known allergies

## 2010-12-09 NOTE — Progress Notes (Signed)
Summary: Directions for Lotensin 20mg   Phone Note From Pharmacy Call back at [phone]   Caller: [facility] Call For: Dr. Alphonsus Sias  Summary of Call: Rep from [facility] called needing clarification on Lotensin 20mg .  Says it was sent with no directions.  Please refer to reference # [id number] when you call back. Initial call taken by: Linde Gillis CMA Duncan Dull),  December 09, 2009 4:42 PM  Follow-up for Phone Call        1 daily for high blood pressure Follow-up by: Cindee Salt MD,  December 09, 2009 5:51 PM  Additional Follow-up for Phone Call Additional follow up Details #1::        [facility] Additional Follow-up by: DeShannon Katrinka Blazing CMA [facility] Dull),  December 09, 2009 5:54 PM    New/Updated Medications: LOTENSIN 20 MG TABS (BENAZEPRIL HCL) 1 tab daily for high blood pressure Prescriptions: LOTENSIN 20 MG TABS (BENAZEPRIL HCL) 1 tab daily for high blood pressure  #90 x 3   Entered by:   Mervin Hack CMA (AAMA)   Authorized by:   Cindee Salt MD   Signed by:   Mervin Hack CMA (AAMA) on 12/09/2009   Method used:   Faxed to ...       [facility] ([facility]-order)             , [place]         Ph:        Fax: [phone]   RxID:   [id number]   Appended Document: Directions for Lotensin 20mg  [facility] called for clarification of instructions for Lotensin 20mg  take one daily for high blood pressure. Spoke with [facility] and ref # [id number].  Appended Document: [facility]    Phone Note Outgoing Call   Call placed by: Cindee Salt MD,  February 17, 2010 2:13 PM Summary of Call: admitted to 201 for urinary retention pain and nausea as well  expects he may be going home today will need to set up follow up appt Initial call taken by: Cindee Salt MD,  February 17, 2010 2:14 PM  Follow-up for Phone Call        Lyla Son,  Please call him tomorrow. Supposed  to be leaving hospital later today. We need to make sure he has a follow up within the next week or so. If hospital sets it up, no need to call Follow-up by: Cindee Salt MD,  February 17, 2010 2:15 PM  Additional Follow-up for Phone Call Additional follow up Details #1::        I left 2 messages on pt's son's answering machine and attempted to call today w/ no answer.  I notified Dr.Hicks Feick that I left the messages w/ no response. Additional Follow-up by: Beau Fanny,  February 20, 2010 1:13 PM

## 2010-12-09 NOTE — Assessment & Plan Note (Signed)
Summary: ROV/AMD      Allergies Added: NKDA  Visit Type:  Follow-up Primary Provider:  Cindee Salt MD  CC:  no complaints.  History of Present Illness:   Mr. Matheney is seen in followup for complete heart block for which he underwent pacemaker implantation. He has a history of ischemic heart disease documented by catheterization in 2001 that was described as advanced. Ejection fraction at that time was normal.  The patient denies SOB, chest pain, edema or palpitations.  The patient's biggest concern his balance. He continues to work on this but this is troublesome.   Current Problems (verified): 1)  Weakness  (ICD-780.79) 2)  Hypertension  (ICD-401.9) 3)  Hyperlipidemia  (ICD-272.4) 4)  Coronary Artery Disease  (ICD-414.00) 5)  Av Block, Complete  (ICD-426.0) 6)  Cerebrovascular Accident, Late Effects  (ICD-438.9) 7)  Actinic Keratosis  (ICD-702.0) 8)  Diabetes Mellitus, Type II, With Renal Complications  (ICD-250.40) 9)  Benign Prostatic Hypertrophy  (ICD-600.00) 10)  Irritable Bowel Syndrome  (ICD-564.1) 11)  Gerd  (ICD-530.81) 12)  Diverticulosis, Colon  (ICD-562.10) 13)  Degenerative Joint Disease, Lumbar Spine  (ICD-721.90)  Current Medications (verified): 1)  Lotensin 20 Mg Tabs (Benazepril Hcl) 2)  Glucotrol Xl 10 Mg Tb24 (Glipizide) .Marland Kitchen.. 1 By Mouth Qam 3)  Aspirin 81 Mg Tbec (Aspirin) .Marland Kitchen.. 1 By Mouth Daily 4)  Glucophage 1000 Mg Tabs (Metformin Hcl) .Marland Kitchen.. 1 By Mouth Two Times A Day 5)  Pravachol 20 Mg Tabs (Pravastatin Sodium) .Marland Kitchen.. 1 By Mouth Daily 6)  Imdur 30 Mg Tb24 (Isosorbide Mononitrate) .Marland Kitchen.. 1 By Mouth Daily 7)  Multivitamins  Tabs (Multiple Vitamin) .Marland Kitchen.. 1 By Mouth Daily 8)  Miralax  Powd (Polyethylene Glycol 3350) .... As Needed 9)  Metamucil  Caps (Psyllium Caps) .Marland Kitchen.. 1 Tbl Spon Daily 10)  Finasteride 5 Mg Tabs (Finasteride) .Marland Kitchen.. 1 By Mouth Daily 11)  Omeprazole 20 Mg Cpdr (Omeprazole) .Marland Kitchen.. 1 Before Breakfast and 1 At Bedtime 12)  Atenolol 50 Mg   Tabs (Atenolol) .Marland Kitchen.. 1 Daily 13)  Clarinex 5 Mg Tabs (Desloratadine) .Marland Kitchen.. 1 Once Daily As Needed By Mouth 14)  Lorazepam 0.5 Mg Tabs (Lorazepam) .Marland Kitchen.. 1-2  At Bedtime To Help Sleep and Abdominal Pain 15)  Plavix 75 Mg Tabs (Clopidogrel Bisulfate) .... Take 1 Tablet By Mouth Once Daily 16)  Colace 100 Mg Caps (Docusate Sodium) .... Take 1 Capsule By Mouth Two Times A Day 17)  Lifescan Unistik Ii Lancets  Misc (Lancets) .... Please Use Fine Point, Use As Directed Once Daily 18)  Onetouch Ultra Test  Strp (Glucose Blood) .... Use Once Daily 19)  Senna S 8.6-50 Mg Tabs (Sennosides-Docusate Sodium) .... 2 Tabs By Mouth Daily As Needed If No Stool in 24 Hours 20)  Magnesium Citrate 1.745 Gm/42ml Soln (Magnesium Citrate) .... 4-8 Ounces By Mouth Every 3rd Day If No Stool 21)  Tamsulosin Hcl 0.4 Mg Caps (Tamsulosin Hcl) .Marland Kitchen.. 1 Cap Daily To Ease Urination  Allergies (verified): No Known Drug Allergies  Past History:  Past Medical History: Last updated: 06/24/2009 Coronary artery disease-----------------------------------------Dr Graciela Husbands Diverticulosis, colon GERD Hyperlipidemia Hypertension Benign prostatic hypertrophy---------------------------------Dr Artis Flock NIDDM with nephropathy DJD--lumbar spine Irritable bowel syndrome CVA--2/10  Past Surgical History: Last updated: 08/07/2009 Angioplasty  1995 Syncope- echo nl (LV EF  55%) 11/1999 Pacer/cath  (ef 65%)  1/01 -  guidant Discovery II DR pulse generator Abd pain 09/2002  H  pylori positive 05/2002 TUNA (Dr Aldean Ast) 07/2003 EGD neg. 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 HEENT Normal.  Neck veins were flat, carotids were brisk.  Lungs were clear.  Heart sounds were regular without murmurs or gallops.  Abdomen was soft with active bowel sounds. There is no clubbing cyanosis or edema. Skin Warm and dry his Romberg was surprisingly normal his finger-nose exam was also normal   PPM Specifications Following MD:  Sherryl Manges, MD     PPM Vendor:  [employer]onic     PPM Model Number:  [id number]     PPM Serial Number:  [id number] PPM DOI:  10/23/2008      Lead 1    Location: RA     DOI: 12/08/1999     Model #: [id number]     Serial #: [id number]     Status: active Lead 2    Location: RV     DOI: 12/08/1999     Model #: 5621     Serial #: [id number]     Status: active  Magnet Response Rate:  BOL 85 ERI  65  Indications:  complete heart block Pacer dependent   PPM Follow Up Remote Check?  No Battery Voltage:  2.79 V     Battery Est. Longevity:  9 years     Pacer Dependent:  Yes       PPM Device Measurements Atrium  Amplitude: 2.8 mV, Impedance: 456 ohms, Threshold: 0.5 V at 0.4 msec Right Ventricle  Impedance: 1178 ohms, Threshold: 0.75 V at 0.4 msec  Episodes MS Episodes:  1     Percent Mode Switch:  <0.1%     Coumadin:  No Ventricular High Rate:  0     Atrial Pacing:  0.3%     Ventricular Pacing:  100%  Parameters Mode:  DDD     Lower Rate Limit:  60     Upper Rate Limit:  130 Paced AV Delay:  150     Sensed AV Delay:  120 Next Remote Date:  02/19/2010     Next Cardiology Appt Due:  11/09/2010 Tech Comments:  No parameter changes.  Device function normal.  [facility] transmissions every 3 months.  ROV 1 year with Dr. Graciela Husbands, [place]. Altha Harm, LPN  November 21, 2009 9:18 AM   Impression & Recommendations:  Problem # 1:  AV BLOCK, COMPLETE (ICD-426.0) this is stable following pacemaker implantation His updated medication list for this problem includes:    Lotensin 20 Mg Tabs (Benazepril hcl)    Aspirin 81 Mg Tbec (Aspirin) .Marland Kitchen... 1 by mouth daily    Imdur 30 Mg Tb24 (Isosorbide mononitrate) .Marland Kitchen... 1 by mouth daily    Atenolol 50 Mg Tabs (Atenolol) .Marland Kitchen... 1 daily    Plavix 75 Mg Tabs (Clopidogrel bisulfate) .Marland Kitchen... Take 1 tablet by mouth once daily  Problem # 2:  PACEMAKER, PERMANENT MDT (ICD-V45.01) Device parameters and data were reviewed and no changes were made  Problem # 3:  CORONARY ARTERY DISEASE (ICD-414.00) currently without symptoms; we will continue his current medications His updated medication list for this problem includes:    Lotensin 20 Mg Tabs (Benazepril hcl)    Aspirin 81 Mg Tbec (Aspirin) .Marland Kitchen... 1 by mouth daily    Imdur 30 Mg Tb24 (Isosorbide mononitrate) .Marland Kitchen... 1 by mouth daily    Atenolol 50 Mg Tabs (Atenolol) .Marland Kitchen... 1 daily    Plavix 75 Mg Tabs (Clopidogrel bisulfate) .Marland Kitchen... Take 1 tablet by mouth once daily  Problem # 4:  CEREBROVASCULAR ACCIDENT, LATE EFFECTS (ICD-438.9)  His updated medication list for this problem includes:    Aspirin 81 Mg Tbec (Aspirin) .Marland Kitchen... 1 by mouth daily    Plavix 75 Mg Tabs (Clopidogrel bisulfate) .Marland Kitchen... Take 1 tablet by mouth once daily wonder whether this is the issue of his nose. I asked that he follow up with Dr. Orlean Bradford for further evaluation of this problem the

## 2010-12-09 NOTE — Letter (Signed)
Summary: Device-Delinquent Phone Journalist, newspaper, Main Office  [address]   [place], [place] [address]   Phone: [phone]  Fax: [phone]     September 05, 2010 MRN: [id number]   TABB CROGHAN [address] [place], [place]  [address]   Dear Mr. MANNING,  According to our records, you were scheduled for a device phone transmission on 09-04-2010.     We did not receive any results from this check.  If you transmitted on your scheduled day, please call us to help troubleshoot your system.  If you forgot to send your transmission, please send one upon receipt of this letter.  Thank you,   Architectural technologist [facility]

## 2010-12-09 NOTE — Miscellaneous (Signed)
Summary: Care Plan/[facility]  Care Plan/[facility]   Imported By: Lanelle Bal 05/08/2010 10:34:04  _____________________________________________________________________  External Attachment:    Type:   Image     Comment:   External Document

## 2010-12-09 NOTE — Letter (Signed)
Summary: Device-Delinquent Phone Journalist, newspaper, Main Office  [address]   [place], [place] [address]   Phone: [phone]  Fax: [phone]     March 06, 2010 MRN: [id number]   ALANTE TOLAN [address] [place], [place]  [address]   Dear Mr. NEWMARK,  According to our records, you were scheduled for a device phone transmission on 02-19-2010.     We did not receive any results from this check.  If you transmitted on your scheduled day, please call us to help troubleshoot your system.  If you forgot to send your transmission, please send one upon receipt of this letter.  Thank you,   Architectural [facility]

## 2010-12-09 NOTE — Progress Notes (Signed)
Summary: refill refill request for proscar  Phone Note Refill Request Message from:  Fax from Pharmacy  Refills Requested: Medication #1:  FINASTERIDE 5 MG TABS 1 by mouth daily [facility]ed [facility] is on your desk.  Initial call taken by: Lowella Petties CMA,  June 27, 2010 1:52 PM  Follow-up for Phone Call        okay x 1 year Follow-up by: Cindee Salt MD,  June 27, 2010 2:09 PM  Additional Follow-up for Phone Call Additional follow up Details #1::        Rx faxed to pharmacy/ [facility] Additional Follow-up by: DeShannon Smith CMA Duncan Dull),  June 27, 2010 2:22 PM    Prescriptions: FINASTERIDE 5 MG TABS (FINASTERIDE) 1 by mouth daily  #90 x 3   Entered by:   Mervin Hack CMA (AAMA)   Authorized by:   Cindee Salt MD   Signed by:   Mervin Hack CMA (AAMA) on 06/27/2010   Method used:   Handwritten   RxID:   [id number]

## 2010-12-09 NOTE — Assessment & Plan Note (Signed)
Summary: 3 M F/U DLO   Vital Signs:  Patient profile:   77 year old male Weight:      137 pounds Temp:     98.4 degrees F oral BP sitting:   168 / 80  (left arm) Cuff size:   regular  Vitals Entered By: Mervin Hack CMA Duncan Dull) (December 06, 2009 2:08 PM) CC: 3 month follow-up   History of Present Illness: Doing about the same  Still has flare ups with bowel--including incontinence Abd pain has not been as bad but still occurs intermittently  Voiding okay up freq at night but may be more IBS related No major daytime symptoms  checks sugars every morning  forgot list but generally less than 150 No hypoglycemic reactions No sores or pain in feet  chronic mood problems-- "with all this going on" Not severe Not really anhedonic  Gets occ chest pain at night----goes away on its own Feels better if he gets up No SOB No palpitations  Allergies: No Known Drug Allergies  Past History:  Past medical, surgical, family and social histories (including risk factors) reviewed for relevance to current acute and chronic problems.  Past Medical History: Reviewed history from 06/24/2009 and no changes required. Coronary artery disease-----------------------------------------Dr Graciela Husbands Diverticulosis, colon GERD Hyperlipidemia Hypertension Benign prostatic hypertrophy---------------------------------Dr Artis Flock NIDDM with nephropathy DJD--lumbar spine Irritable bowel syndrome CVA--2/10  Past Surgical History: Reviewed history from 08/07/2009 and no changes required. Angioplasty  1995 Syncope- echo nl (LV EF  55%) 11/1999 Pacer/cath  (ef 65%)  1/01 -  guidant Discovery II DR pulse generator Abd pain 09/2002  H  pylori positive 05/2002 TUNA (Dr Aldean Ast) 07/2003 EGD neg. 07/2005 ABIs normal 10/2005  Family History: Reviewed history from 02/11/2009 and no changes required. Mom died of old age Dad died of old age 69 brother died of accident Sister with DM  Social  History: Reviewed history from 12/31/2008 and no changes required. 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  NAD Neck:  supple, no masses, no thyromegaly, no carotid bruits, and no cervical lymphadenopathy.   Lungs:  normal respiratory effort and normal breath sounds.   Heart:  normal rate, regular rhythm, no murmur, and no gallop.   Abdomen:  soft and non-tender.   Pulses:  feet warm No pulses felt on right, faint on left Extremities:  no edema Neurologic:  walks with walker needs help on and off table Skin:  no suspicious lesions and no ulcerations.   Psych:  normally interactive, good eye contact, not anxious appearing, and not depressed appearing.    Diabetes Management Exam:    Foot Exam (with socks and/or shoes not present):       Sensory-Pinprick/Light touch:          Left medial foot (L-4): normal          Left dorsal foot (L-5): normal          Left lateral foot (S-1): normal          Right medial foot (L-4): normal          Right dorsal foot (L-5): normal          Right lateral foot (S-1): normal       Inspection:  Left foot: normal          Right foot: normal       Nails:          Left foot: fungal infection          Right foot: fungal infection    Eye Exam:       Eye Exam done elsewhere          Date: 01/07/2009          Results: no retinopathy          Done by: Dr Shannan Harper office   Impression & Recommendations:  Problem # 1:  DIABETES MELLITUS, TYPE II, WITH RENAL COMPLICATIONS (ICD-250.40) Assessment Unchanged  seems to have good control will check labs  His updated medication list for this problem includes:    Lotensin 20 Mg Tabs (Benazepril hcl)    Glucotrol Xl 10 Mg Tb24  (Glipizide) .Marland Kitchen... 1 by mouth qam    Aspirin 81 Mg Tbec (Aspirin) .Marland Kitchen... 1 by mouth daily    Glucophage 1000 Mg Tabs (Metformin hcl) .Marland Kitchen... 1 by mouth two times a day  Labs Reviewed: Creat: 1.5 (06/24/2009)     Last Eye Exam: no retinopathy (01/07/2009) Reviewed HgBA1c results: 6.4 (02/11/2009)  6.4 (11/19/2008)  Orders: Venipuncture ([address]) Specimen Handling ([address]) T- Hemoglobin A1C ([address]11-91478)  Problem # 2:  CORONARY ARTERY DISEASE (ICD-414.00) Assessment: Unchanged seems quiet No changes   His updated medication list for this problem includes:    Lotensin 20 Mg Tabs (Benazepril hcl)    Aspirin 81 Mg Tbec (Aspirin) .Marland Kitchen... 1 by mouth daily    Imdur 30 Mg Tb24 (Isosorbide mononitrate) .Marland Kitchen... 1 by mouth daily    Atenolol 50 Mg Tabs (Atenolol) .Marland Kitchen... 1 daily    Plavix 75 Mg Tabs (Clopidogrel bisulfate) .Marland Kitchen... Take 1 tablet by mouth once daily  Problem # 3:  HYPERTENSION (ICD-401.9) Assessment: Unchanged  reasonable control high fall risk with overtreatement so no changes  His updated medication list for this problem includes:    Lotensin 20 Mg Tabs (Benazepril hcl)    Atenolol 50 Mg Tabs (Atenolol) .Marland Kitchen... 1 daily  BP today: 168/80 Prior BP: 162/70 (11/21/2009)  Prior 10 Yr Risk Heart Disease: N/A (04/07/2007)  Labs Reviewed: K+: 4.4 (06/24/2009) Creat: : 1.5 (06/24/2009)   Chol: 168 (02/11/2009)   HDL: 29.00 (02/11/2009)   LDL: DEL (11/19/2008)   TG: 305.0 (02/11/2009)  Orders: T-Comprehensive Metabolic Panel ([address]562-13086) T-TSH ([address]846-96295)  Problem # 4:  HYPERLIPIDEMIA (ICD-272.4) Assessment: Unchanged  no problems with meds will check labs  His updated medication list for this problem includes:    Pravachol 20 Mg Tabs (Pravastatin sodium) .Marland Kitchen... 1 by mouth daily  Labs Reviewed: SGOT: 21 (06/24/2009)   SGPT: 9 (06/24/2009)  Lipid Goals: Chol Goal: 200 (04/07/2007)   HDL Goal: 40 (04/07/2007)   LDL Goal: 70 (04/07/2007)   TG Goal: 150 (04/07/2007)  Prior 10  Yr Risk Heart Disease: N/A (04/07/2007)   HDL:29.00 (02/11/2009), 27.5 (11/19/2008)  LDL:DEL (11/19/2008), DEL (12/05/2007)  Chol:168 (02/11/2009), 161 (11/19/2008)  Trig:305.0 (02/11/2009), 410 (11/19/2008)  Orders: T-Lipid Profile (28413-24401)  Problem # 5:  IRRITABLE BOWEL SYNDROME (ICD-564.1) Assessment: Unchanged chronic but stable  Problem # 6:  BENIGN PROSTATIC HYPERTROPHY (ICD-600.00) Assessment: Comment Only voiding okay  Complete Medication List: 1)  Lotensin 20 Mg Tabs (Benazepril hcl) 2)  Glucotrol Xl 10 Mg Tb24 (Glipizide) .Marland Kitchen.. 1 by mouth qam 3)  Aspirin 81 Mg Tbec (Aspirin) .Marland Kitchen.. 1 by mouth daily 4)  Glucophage  1000 Mg Tabs (Metformin hcl) .Marland Kitchen.. 1 by mouth two times a day 5)  Pravachol 20 Mg Tabs (Pravastatin sodium) .Marland Kitchen.. 1 by mouth daily 6)  Imdur 30 Mg Tb24 (Isosorbide mononitrate) .Marland Kitchen.. 1 by mouth daily 7)  Multivitamins Tabs (Multiple vitamin) .Marland Kitchen.. 1 by mouth daily 8)  Miralax Powd (Polyethylene glycol 3350) .... As needed 9)  Metamucil Caps (Psyllium caps) .Marland Kitchen.. 1 tbl spon daily 10)  Finasteride 5 Mg Tabs (Finasteride) .Marland Kitchen.. 1 by mouth daily 11)  Omeprazole 20 Mg Cpdr (Omeprazole) .Marland Kitchen.. 1 before breakfast and 1 at bedtime 12)  Atenolol 50 Mg Tabs (Atenolol) .Marland Kitchen.. 1 daily 13)  Clarinex 5 Mg Tabs (Desloratadine) .Marland Kitchen.. 1 once daily as needed by mouth 14)  Lorazepam 0.5 Mg Tabs (Lorazepam) .Marland Kitchen.. 1-2  at bedtime to help sleep and abdominal pain 15)  Plavix 75 Mg Tabs (Clopidogrel bisulfate) .... Take 1 tablet by mouth once daily 16)  Colace 100 Mg Caps (Docusate sodium) .... Take 1 capsule by mouth two times a day 17)  Lifescan Unistik Ii Lancets Misc (Lancets) .... Please use fine point, use as directed once daily 18)  Onetouch Ultra Test Strp (Glucose blood) .... Use once daily 19)  Senna S 8.6-50 Mg Tabs (Sennosides-docusate sodium) .... 2 tabs by mouth daily as needed if no stool in 24 hours 20)  Magnesium Citrate 1.745 Gm/70ml Soln (Magnesium citrate) .... 4-8 ounces by  mouth every 3rd day if no stool 21)  Tamsulosin Hcl 0.4 Mg Caps (Tamsulosin hcl) .Marland Kitchen.. 1 cap daily to ease urination  Patient Instructions: 1)  Please schedule a follow-up appointment in 6 months .  Prescriptions: TAMSULOSIN HCL 0.4 MG CAPS (TAMSULOSIN HCL) 1 cap daily to ease urination  #90 x 3   Entered by:   Mervin Hack CMA (AAMA)   Authorized by:   Cindee Salt MD   Signed by:   Mervin Hack CMA (AAMA) on 12/06/2009   Method used:   Faxed to ...       [facility]             , [place]         Ph:        Fax: [phone]   RxID:   [id number] OMEPRAZOLE 20 MG CPDR (OMEPRAZOLE) 1 before breakfast and 1 at bedtime  #90 x 3   Entered by:   Mervin Hack CMA (AAMA)   Authorized by:   Cindee Salt MD   Signed by:   Mervin Hack CMA (AAMA) on 12/06/2009   Method used:   Faxed to ...       [facility]             , [place]         Ph:        Fax: [phone]   RxID:   [id number] PRAVACHOL 20 MG TABS (PRAVASTATIN SODIUM) 1 by mouth daily  #90 x 3   Entered by:   Mervin Hack CMA (AAMA)   Authorized by:   Cindee Salt MD   Signed by:   Mervin Hack CMA (AAMA) on 12/06/2009   Method used:   Faxed to ...       [facility])             , [place]         Ph:        Fax: [phone]   RxID:  [id number] ATENOLOL 50 MG  TABS (ATENOLOL) 1 daily  #90 x 3   Entered by:   Mervin Hack CMA (AAMA)   Authorized by:   Cindee Salt MD   Signed by:   Mervin Hack CMA (AAMA) on 12/06/2009   Method used:   Faxed to ...       [facility])             , [place]         Ph:        Fax: [phone]   RxID:   [id number] GLUCOTROL XL 10 MG TB24 (GLIPIZIDE) 1 by mouth qam  #90 x 3   Entered by:   Mervin Hack CMA (AAMA)   Authorized by:   Cindee Salt MD   Signed  by:   Mervin Hack CMA (AAMA) on 12/06/2009   Method used:   Faxed to ...       [facility]             , [place]         Ph:        Fax: [phone]   RxID:   [id number] IMDUR 30 MG TB24 (ISOSORBIDE MONONITRATE) 1 by mouth daily  #90 x 3   Entered by:   Mervin Hack CMA (AAMA)   Authorized by:   Cindee Salt MD   Signed by:   Mervin Hack CMA (AAMA) on 12/06/2009   Method used:   Faxed to ...       [facility]             , [place]         Ph:        Fax: [phone]   RxID:   [id number] PLAVIX 75 MG TABS (CLOPIDOGREL BISULFATE) take 1 tablet by mouth once daily  #90 x 3   Entered by:   Mervin Hack CMA (AAMA)   Authorized by:   Cindee Salt MD   Signed by:   Mervin Hack CMA (AAMA) on 12/06/2009   Method used:   Faxed to ...       [facility]             , [place]         Ph:        Fax: [phone]   RxID:   [id number] LOTENSIN 20 MG TABS (BENAZEPRIL HCL)   #90 x 3   Entered by:   Mervin Hack CMA (AAMA)   Authorized by:   Cindee Salt MD   Signed by:   Mervin Hack CMA (AAMA) on 12/06/2009   Method used:   Faxed to ...       [facility]             , [place]         Ph:        Fax: [phone]   RxID:   [id number] GLUCOPHAGE 1000 MG TABS (METFORMIN HCL) 1 by mouth two times a day  #180 x 3   Entered by:   Mervin Hack CMA (AAMA)   Authorized by:   Cindee Salt MD   Signed by:   Mervin Hack CMA (AAMA) on 12/06/2009   Method used:   Faxed to ...       [facility]             ,  [place]         Ph:        Fax: [phone]   RxID:   [id number]   Current Allergies (reviewed today): No known allergies

## 2010-12-09 NOTE — Assessment & Plan Note (Signed)
Summary: 3 M F/U DLO   Vital Signs:  Patient profile:   77 year old male Weight:      135 pounds Temp:     98.5 degrees F oral Pulse rate:   68 / minute Pulse rhythm:   regular BP sitting:   110 / 66  (left arm) Cuff size:   regular  Vitals Entered By: Sydell Axon LPN (August 07, 2010 3:27 PM) CC: 3 Month follow-up   History of Present Illness: Doing "not too good" Stomach is often "not right"  feels that he needs to void or move his bowels  Often better during the day (bothers him at night) "I ache all over (lying in bed)" Nothing found by urologist  Still feels constipated and his bowels are hard Takes metamucil regularly but not much help Uses dulcolax as needed   checks blood pressure regularly   138/59    up to 177/76  sugars 108- 170 No sig hypoglycemia  Has had cold symptoms with runny nose  occ cough No sig SOB low grade fever at night  Allergies: No Known Drug Allergies  Past History:  Past medical, surgical, family and social histories (including risk factors) reviewed for relevance to current acute and chronic problems.  Past Medical History: Reviewed history from 06/24/2009 and no changes required. Coronary artery disease-----------------------------------------Dr Graciela Husbands Diverticulosis, colon GERD Hyperlipidemia Hypertension Benign prostatic hypertrophy---------------------------------Dr Artis Flock NIDDM with nephropathy DJD--lumbar spine Irritable bowel syndrome CVA--2/10  Past Surgical History: Reviewed history from 08/07/2009 and no changes required. Angioplasty  1995 Syncope- echo nl (LV EF  55%) 11/1999 Pacer/cath  (ef 65%)  1/01 -  guidant Discovery II DR pulse generator Abd pain 09/2002  H  pylori positive 05/2002 TUNA (Dr Aldean Ast) 07/2003 EGD neg. 07/2005 ABIs normal 10/2005  Family History: Reviewed history from 02/11/2009 and no changes required. Mom died of old age Dad died of old age 33 brother died of accident Sister with  DM  Social History: Reviewed history from 12/31/2008 and no changes required. Widowed--5 children Lives with son and DIL Former Smoker Alcohol use-no--heavy in past Retired--drove Merchant navy officer for [employer]  Review of Systems       ongoing blepharitis--did see eye doctor. Has cream but not consistent with use appetite has been off some weight stable  Physical Exam  General:  alert.  NAD Eyes:  right ectropion with mild inflammation Mouth:  no erythema and no exudates.   Neck:  supple, no masses, and no cervical lymphadenopathy.   Lungs:  normal respiratory effort, no intercostal retractions, no accessory muscle use, and normal breath sounds.   Heart:  normal rate, regular rhythm, no murmur, and no gallop.   Abdomen:  soft, non-tender, and normal bowel sounds.   Pulses:  faint in feet Extremities:  no edema Skin:  no suspicious lesions and no ulcerations.   ?early actinic on vertex Psych:  normally interactive, good eye contact, and not depressed appearing.     Impression & Recommendations:  Problem # 1:  URI (ICD-465.9) Assessment New seems viral  no Rx needed will go back for eye reeval  His updated medication list for this problem includes:    Aspirin 81 Mg Tbec (Aspirin) .Marland Kitchen... 1 by mouth daily    Clarinex 5 Mg Tabs (Desloratadine) .Marland Kitchen... 1 once daily as needed by mouth  Problem # 2:  IRRITABLE BOWEL SYNDROME (ICD-564.1) Assessment: Deteriorated seems to be cause of night pain urged him to be regular with miralax and avoid cathartics  Problem # 3:  HYPERTENSION (ICD-401.9) Assessment: Unchanged reasonable control no changes  His updated medication list for this problem includes:    Lotensin 20 Mg Tabs (Benazepril hcl) .Marland Kitchen... 1 tab daily for high blood pressure    Atenolol 50 Mg Tabs (Atenolol) .Marland Kitchen... 1 daily  BP today: 110/66 Prior BP: 150/76 (04/24/2010)  Prior 10 Yr Risk Heart Disease: N/A (04/07/2007)  Labs Reviewed: K+: 4.9 (04/24/2010) Creat: :  1.2 (04/24/2010)   Chol: 178 (12/06/2009)   HDL: 37 (12/06/2009)   LDL: 70 (12/06/2009)   TG: 356 (12/06/2009)  Problem # 4:  DIABETES MELLITUS, TYPE II, WITH RENAL COMPLICATIONS (ICD-250.40) Assessment: Unchanged seems to be fine will recheck labs at next visit  His updated medication list for this problem includes:    Lotensin 20 Mg Tabs (Benazepril hcl) .Marland Kitchen... 1 tab daily for high blood pressure    Glucotrol Xl 10 Mg Tb24 (Glipizide) .Marland Kitchen... 1 by mouth qam    Aspirin 81 Mg Tbec (Aspirin) .Marland Kitchen... 1 by mouth daily    Glucophage 1000 Mg Tabs (Metformin hcl) .Marland Kitchen... 1 by mouth two times a day  Labs Reviewed: Creat: 1.2 (04/24/2010)     Last Eye Exam: no retinopathy (01/07/2009) Reviewed HgBA1c results: 6.1 (04/24/2010)  6.5 (12/06/2009)  Complete Medication List: 1)  Tamsulosin Hcl 0.4 Mg Caps (Tamsulosin hcl) .Marland Kitchen.. 1 cap daily to ease urination 2)  Lotensin 20 Mg Tabs (Benazepril hcl) .Marland Kitchen.. 1 tab daily for high blood pressure 3)  Glucotrol Xl 10 Mg Tb24 (Glipizide) .Marland Kitchen.. 1 by mouth qam 4)  Aspirin 81 Mg Tbec (Aspirin) .Marland Kitchen.. 1 by mouth daily 5)  Glucophage 1000 Mg Tabs (Metformin hcl) .Marland Kitchen.. 1 by mouth two times a day 6)  Pravachol 20 Mg Tabs (Pravastatin sodium) .Marland Kitchen.. 1 by mouth daily 7)  Imdur 30 Mg Tb24 (Isosorbide mononitrate) .Marland Kitchen.. 1 by mouth daily 8)  Miralax Powd (Polyethylene glycol 3350) .Marland Kitchen.. 1 capful with water two times a day 9)  Metamucil Caps (Psyllium caps) .Marland Kitchen.. 1 tbl spon daily 10)  Finasteride 5 Mg Tabs (Finasteride) .Marland Kitchen.. 1 by mouth daily 11)  Omeprazole 20 Mg Cpdr (Omeprazole) .Marland Kitchen.. 1 before breakfast and 1 at bedtime 12)  Atenolol 50 Mg Tabs (Atenolol) .Marland Kitchen.. 1 daily 13)  Clarinex 5 Mg Tabs (Desloratadine) .Marland Kitchen.. 1 once daily as needed by mouth 14)  Lorazepam 0.5 Mg Tabs (Lorazepam) .Marland Kitchen.. 1-2  at bedtime to help sleep and abdominal pain 15)  Plavix 75 Mg Tabs (Clopidogrel bisulfate) .... Take 1 tablet by mouth once daily 16)  Colace 100 Mg Caps (Docusate sodium) .... Take 1 capsule by  mouth two times a day 17)  Senna S 8.6-50 Mg Tabs (Sennosides-docusate sodium) .... 2 tabs by mouth in morning if no stool in 24 hours 18)  Magnesium Citrate 1.745 Gm/63ml Soln (Magnesium citrate) .... 4-8 ounces by mouth every 3rd day if no stool 19)  Multivitamins Tabs (Multiple vitamin) .Marland Kitchen.. 1 by mouth daily 20)  Lifescan Unistik Ii Lancets Misc (Lancets) .... Please use fine point, use as directed once daily 21)  Onetouch Ultra Test Strp (Glucose blood) .... Use once daily 22)  Sulfacetamide Sodium 10 % Soln (Sulfacetamide sodium) .... 2 drops in each eye three times a day for 5 days 23)  Triamcinolone Acetonide 0.1 % Crea (Triamcinolone acetonide) .... Apply to rash behind ear three times a day as needed  Patient Instructions: 1)  Please take miralax (polyethylene glycol), 1 capful with water two times a day. Please take it regularly and  it should help you move your bowels regularly fairly soon 2)  Don''t take dulcolax anymore 3)  try tylenol when the stomach pain comes on 4)  Please go back to the eye doctor for follow up 5)  Please schedule a follow-up appointment in 1 month.   Current Allergies (reviewed today): No known allergies   Appended Document: 3 M F/U DLO   Influenza Vaccine    Vaccine Type: Fluvax MCR    Site: left deltoid    Mfr: GlaxoSmithKline    Dose: 0.5 ml    Route: IM    Given by: Sydell Axon LPN    Exp. Date: 05/09/2011    Lot #: [id number]    VIS given: 06/03/10 version given August 07, 2010.  Flu Vaccine Consent Questions    Do you have a history of severe allergic reactions to this vaccine? no    Any prior history of allergic reactions to egg and/or gelatin? no    Do you have a sensitivity to the preservative Thimersol? no    Do you have a past history of Guillan-Barre Syndrome? no    Do you currently have an acute febrile illness? no    Have you ever had a severe reaction to latex? no    Vaccine information given and explained to patient?  yes

## 2010-12-09 NOTE — Procedures (Signed)
Summary: Pacer check/phone      Allergies Added: NKDA  Allergies (verified): No Known Drug Allergies   PPM Specifications Following MD:  Sherryl Manges, MD     PPM Vendor:  Medtronic     PPM Model Number:  SEDroi     PPM Serial Number:  [id number] PPM DOI:  10/23/2008      Lead 1    Location: RA     DOI: 12/08/1999     Model #: 1342T     Serial #: [id number]     Status: active Lead 2    Location: RV     DOI: 12/08/1999     Model #: 1478     Serial #: [id number]     Status: active  Magnet Response Rate:  BOL 85 ERI  65  Indications:  complete heart block Pacer dependent   PPM Follow Up Remote Check?  No Battery Voltage:  2.79 V     Battery Est. Longevity:  11.5 years     Pacer Dependent:  Yes       PPM Device Measurements Atrium  Amplitude: 2.8 mV, Impedance: 482 ohms, Threshold: 0.5 V at 0.4 msec Right Ventricle  Impedance: 1135 ohms, Threshold: 0.875 V at 0.4 msec  Episodes MS Episodes:  0     Percent Mode Switch:  0     Coumadin:  No Ventricular High Rate:  0     Atrial Pacing:  0.3%     Ventricular Pacing:  99.9%  Parameters Mode:  DDD     Lower Rate Limit:  60     Upper Rate Limit:  130 Paced AV Delay:  150     Sensed AV Delay:  120 Next Remote Date:  09/04/2010     Next Cardiology Appt Due:  11/09/2010 Tech Comments:  No parameter changes.  Device function normal.  Carelink transmissions every 3 months.  ROV January 2012 with Dr. Graciela Husbands in [place]. Altha Harm, LPN  June 04, 2010 10:06 AM

## 2010-12-09 NOTE — Letter (Signed)
Summary: [facility]   Imported By: Lanelle Bal 02/20/2010 12:19:29  _____________________________________________________________________  External Attachment:    Type:   Image     Comment:   External Document

## 2010-12-09 NOTE — Progress Notes (Signed)
Summary: form for diabetic supplies  Phone Note From Pharmacy   Caller: [facility] Summary of Call: Form for diabetic supplies is on your desk. Initial call taken by: Lowella Petties CMA,  May 20, 2010 10:58 AM  Follow-up for Phone Call        form signed Follow-up by: Cindee Salt MD,  May 20, 2010 12:05 PM  Additional Follow-up for Phone Call Additional follow up Details #1::        Form faxed to [facility] Additional Follow-up by: Mervin Hack CMA Duncan Dull),  May 20, 2010 1:14 PM

## 2010-12-09 NOTE — Letter (Signed)
Summary: [facility]  [facility]   Imported By: Lanelle Bal 03/11/2010 13:18:51  _____________________________________________________________________  External Attachment:    Type:   Image     Comment:   External Document  Appended Document: [facility] stable right carotid stenosis follow up in 3 months

## 2010-12-09 NOTE — Letter (Signed)
Summary: Surgical Clearance/[facility]  Surgical Clearance/[facility]   Imported By: Lanelle Bal 09/03/2010 12:13:37  _____________________________________________________________________  External Attachment:    Type:   Image     Comment:   External Document

## 2010-12-09 NOTE — Letter (Signed)
Summary: Letter Regarding Disease Mgmt Program/[facility]  Letter Regarding Disease Mgmt Program/[facility]   Imported By: Lanelle Bal 05/30/2010 10:48:23  _____________________________________________________________________  External Attachment:    Type:   Image     Comment:   External Document

## 2010-12-09 NOTE — Letter (Signed)
Summary: [facility]  [facility]   Imported By: Lanelle Bal 05/02/2010 11:34:56  _____________________________________________________________________  External Attachment:    Type:   Image     Comment:   External Document

## 2010-12-09 NOTE — Progress Notes (Signed)
----   Converted from flag ---- ---- 02/20/2010 12:51 PM, Cindee Salt MD wrote: thanks for trying   ---- 02/20/2010 10:36 AM, Beau Fanny wrote: Kallie Locks left 2 messages on pt's son's answering machine to set up appt. for pt. w/ no response.  I tried to call again today and they're not home. Lyla Son ------------------------------

## 2010-12-11 NOTE — Assessment & Plan Note (Signed)
Summary: F1Y/AMD      Allergies Added: NKDA  Visit Type:  Follow-up Primary Provider:  Cindee Salt MD  CC:  "Doing well"..  History of Present Illness:   Andres [name] is seen in followup for complete heart block for which he underwent pacemaker implantation. He has a history of ischemic heart disease documented by catheterization in 2001 that was described as advanced. Ejection fraction at that time was normal.  The patient denies SOB, chest pain, edema or palpitations.  his biggest concern remains his balance. This continues to be progressive.  The other issue he notices that over the last 2 weeks he has had problems with numbness in his left hand particularly his fourth and fifth fingers, up his arm and into his left jaw.  as relates to renal issues he highlights his difficulty with urination   Current Medications (verified): 1)  Tamsulosin Hcl 0.4 Mg Caps (Tamsulosin Hcl) .Marland Kitchen.. 1 Cap Daily To Ease Urination 2)  Lotensin 20 Mg Tabs (Benazepril Hcl) .Marland Kitchen.. 1 Tab Daily For High Blood Pressure 3)  Glucotrol Xl 10 Mg Tb24 (Glipizide) .Marland Kitchen.. 1 By Mouth Before Breakfast 4)  Aspirin 81 Mg Tbec (Aspirin) .Marland Kitchen.. 1 By Mouth Daily 5)  Glucophage 26000 Mg Tabs (Metformin Hcl) .Marland Kitchen.. 1 By Mouth Two Times A Day 6)  Pravachol 20 Mg Tabs (Pravastatin Sodium) .Marland Kitchen.. 1 By Mouth Daily 7)  Imdur 30 Mg Tb24 (Isosorbide Mononitrate) .Marland Kitchen.. 1 By Mouth Daily 8)  Miralax  Powd (Polyethylene Glycol 3350) .Marland Kitchen.. 1 Capful With Water One To Two Times A Day For Constipation 9)  Finasteride 5 Mg Tabs (Finasteride) .Marland Kitchen.. 1 By Mouth Daily 10)  Omeprazole 20 Mg Cpdr (Omeprazole) .Marland Kitchen.. 1 Before Breakfast and 1 At Bedtime 11)  Atenolol 50 Mg  Tabs (Atenolol) .Marland Kitchen.. 1 Daily 12)  Clarinex 5 Mg Tabs (Desloratadine) .Marland Kitchen.. 1 Once Daily As Needed By Mouth 13)  Lorazepam 0.5 Mg Tabs (Lorazepam) .Marland Kitchen.. 1-2  At Bedtime To Help Sleep and Abdominal Pain 14)  Plavix 75 Mg Tabs (Clopidogrel Bisulfate) .... Take 1 Tablet By Mouth Once  Daily 15)  Multivitamins  Tabs (Multiple Vitamin) .Marland Kitchen.. 1 By Mouth Daily 16)  Triamcinolone Acetonide 0.1 % Crea (Triamcinolone Acetonide) .... Apply To Rash Behind Ear Three Times A Day As Needed 17)  Lifescan Unistik Ii Lancets  Misc (Lancets) .... Please Use Fine Point, Use As Directed Once Daily 18)  Onetouch Ultra Test  Strp (Glucose Blood) .... Use Once Daily  Allergies (verified): No Known Drug Allergies  Past History:  Past Medical History: Last updated: 06/24/2009 Coronary artery disease-----------------------------------------Dr Graciela Husbands Diverticulosis, colon GERD Hyperlipidemia Hypertension Benign prostatic hypertrophy---------------------------------Dr Artis Flock NIDDM with nephropathy DJD--lumbar spine Irritable bowel syndrome CVA--2/10  Past Surgical History: Last updated: 08/07/2009 Angioplasty  1995 Syncope- echo nl (LV EF  55%) 11/1999 Pacer/cath  (ef 65%)  1/01 -  guidant Discovery II DR pulse generator Abd pain 09/2002  H  pylori positive 05/2002 TUNA (Dr Aldean Ast) 07/2003 EGD neg. 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 HEENT Normal except ptosis and hearing aids bilaterally  Neck veins were flat, carotids were brisk.  Lungs were clear.  Heart sounds were regular without  murmurs or gallops.  Abdomen was soft with active bowel sounds. There is no clubbing cyanosis or edema. Skin Warm and dry normal reflexes right biceps I could not elicit a reflex in his left arm. He is having some trouble relaxing this arm. Strength was normal in his hand.    PPM Specifications Following MD:  Sherryl Manges, MD     PPM Vendor:  Medtronic     PPM Model Number:  SEDroi     PPM Serial Number:  [id number] PPM DOI:  10/23/2008      Lead 1    Location: RA     DOI: 12/08/1999     Model #: 1342T     Serial #: [id number]     Status: active Lead 2    Location: RV     DOI: 12/08/1999     Model #: 8469     Serial #: [id number]     Status: active  Magnet Response Rate:  BOL 85 ERI  65  Indications:  complete heart block Pacer dependent   PPM Follow Up Remote Check?  No Battery Voltage:  2.79 V     Battery Est. Longevity:  11.5 years     Pacer Dependent:  Yes       PPM Device Measurements Atrium  Amplitude: 2.0 mV, Impedance: 449 ohms, Threshold: 0.5 V at 0.4 msec Right Ventricle  Impedance: 1221 ohms, Threshold: 0.75 V at 0.4 msec  Episodes MS Episodes:  2     Percent Mode Switch:  <0.1%     Coumadin:  No Ventricular High Rate:  100%      Parameters Mode:  DDD     Lower Rate Limit:  60     Upper Rate Limit:  130 Paced AV Delay:  150     Sensed AV Delay:  120 Next Remote Date:  03/05/2011     Next Cardiology Appt Due:  11/10/2011 Tech Comments:  No parameter changes.  Device function normal.  Carelink transmissions every 3 months.  ROV 1 year with Dr. Graciela Husbands in [place]. Andres Harm, Andres [name]  December 01, 2010 9:36 AM   Impression & Recommendations:  Problem # 1:  PACEMAKER, PERMANENT MDT (ICD-V45.01) Device parameters and data were reviewed and no changes were made  specifically , no atrial fibrillation of note was recorded;  two episodes were identified,  we are currently trying to clarify the durastion  Problem # 2:  AV BLOCK, COMPLETE (ICD-426.0)  stable His updated  medication list for this problem includes:    Lotensin 20 Mg Tabs (Benazepril hcl) .Marland Kitchen... 1 tab daily for high blood pressure    Aspirin 81 Mg Tbec (Aspirin) .Marland Kitchen... 1 by mouth daily    Imdur 30 Mg Tb24 (Isosorbide mononitrate) .Marland Kitchen... 1 by mouth daily    Atenolol 50 Mg Tabs (Atenolol) .Marland Kitchen... 1 daily    Plavix 75 Mg Tabs (Clopidogrel bisulfate) .Marland Kitchen... Take 1 tablet by mouth once daily  His updated medication list for this problem includes:    Lotensin 20 Mg Tabs (Benazepril  hcl) ..... 1 tab daily for high blood pressure    Aspirin 81 Mg Tbec (Aspirin) .Marland Kitchen... 1 by mouth daily    Imdur 30 Mg Tb24 (Isosorbide mononitrate) .Marland Kitchen... 1 by mouth daily    Atenolol 50 Mg Tabs (Atenolol) .Marland Kitchen... 1 daily    Plavix 75 Mg Tabs (Clopidogrel bisulfate) .Marland Kitchen... Take 1 tablet by mouth once daily  Problem # 3:  HYPERTENSION (ICD-401.9)  Modestly elevated.  following his head CT; would ask Dr Alphonsus Sias to consider increasing his antihypertensive therpay  His updated medication list for this problem includes:    Lotensin 20 Mg Tabs (Benazepril hcl) .Marland Kitchen... 1 tab daily for high blood pressure    Aspirin 81 Mg Tbec (Aspirin) .Marland Kitchen... 1 by mouth daily    Atenolol 50 Mg Tabs (Atenolol) .Marland Kitchen... 1 daily  Problem # 4:  CORONARY ARTERY DISEASE (ICD-414.00) stable wihtout specifric symptoms  Problem # 5:  CVA (ICD-434.91) He and I are worriend about an intercurrent CVA manifested by sensory changes and areflexia.   As he has pacer in place, he is not a candidate for MRI, so will undertake CT with contrast (depneding on CR which we are drawing today)  He is to see Dr Alphonsus Sias on Thurs so these results should be available at that time;;  we will continue ASA and Plavix.   We are looking to see if his device can more clearly reveal how much afib as his CHADSVAS score is  age-32, HTN-1, DM-1, CVA-2, Vasc dis-1 M= 7 and coumadin would be indicated if demonstrated to be present  GFR in 10/11 was 55 His updated medication list for this problem  includes:    Aspirin 81 Mg Tbec (Aspirin) .Marland Kitchen... 1 by mouth daily    Plavix 75 Mg Tabs (Clopidogrel bisulfate) .Marland Kitchen... Take 1 tablet by mouth once daily  Other Orders: T-Basic Metabolic Panel [phone]) CT Scan  (CT Scan)  Patient Instructions: 1)  Your physician recommends that you schedule a follow-up appointment in: 6 MONTHS 2)  Your physician recommends that you continue on your current medications as directed. Please refer to the Current Medication list given to you today. 3)  YOU ARE SCHEDULED FOR HEAD CT SCAN AT [facility] 12/03/10 @ 9:00 PLEASE ARRIVE AT 8:45. PLEASE DO NOT TAKE YOUR GLUCOPHAGE/METFORMIN THE MORNING OF CT SCAN AND HOLD FOR 48 HOURS AFTER CT SCAN. DO NOT EAT OR DRINK ANYTHING 4 HOURS PRIOR TO CT, YOU MAY TAKE OTHER AM MEDICATIONS WITH A SIP OF WATER. 4)  Non-Cardiac CT scanning, (CAT scanning), is a noninvasive, special x-ray that produces cross-sectional images of the body using x-rays and a computer. CT scans help physicians diagnose and treat medical conditions. For some CT exams, a contrast material is used to enhance visibility in the area of the body being studied. CT scans provide greater clarity and reveal more details than regular x-ray exams.

## 2010-12-11 NOTE — Progress Notes (Addendum)
   Phone Note Call from Patient   Call For: Dr Berton Mount Summary of Call: Pt son called because father had rec'd a letter stating he had not transmitted PPM data recently. Son went to help, stated it took a long time but he thinks transmission was successful and dad wasn't giving it long enough. Son requests someone check and make sure the transmission went through and call if it did not. Initial call taken by: Park Breed PA-C,  November 20, 2010 7:30 PM

## 2010-12-11 NOTE — Assessment & Plan Note (Addendum)
Summary: FOLLOW UP / LFW   Vital Signs:  Patient profile:   77 year old male Weight:      134 pounds Temp:     98.4 degrees F oral Pulse rate:   73 / minute Pulse rhythm:   regular BP sitting:   166 / 73  (left arm) Cuff size:   regular  Vitals Entered By: Mervin Hack CMA Duncan Dull) (December 05, 2010 3:06 PM) CC: 3 month follow-up   History of Present Illness: Still not doing great never stopped the glipizide as I instructed  Ongoing problems with bowels seems better using the polyethylene glycol Continues on the omeprazole-- seems better now  Not sleeping well at night, he awakens multiple times with left leg pain No major daytime somnolence  Troubled with nasal congestion---feels there are sores in there  Trouble voiding Flow okay at times, slow other times and "I have to force it" This is better after having coffee  Checks sugars regularly (usually once a day) Generally around 160-170 fasting No hypoglycemic reactions   Allergies: No Known Drug Allergies  Past History:  Past medical, surgical, family and social histories (including risk factors) reviewed for relevance to current acute and chronic problems.  Past Medical History: Reviewed history from 06/24/2009 and no changes required. Coronary artery disease-----------------------------------------Dr Graciela Husbands Diverticulosis, colon GERD Hyperlipidemia Hypertension Benign prostatic hypertrophy---------------------------------Dr Artis Flock NIDDM with nephropathy DJD--lumbar spine Irritable bowel syndrome CVA--2/10  Past Surgical History: Reviewed history from 08/07/2009 and no changes required. Angioplasty  1995 Syncope- echo nl (LV EF  55%) 11/1999 Pacer/cath  (ef 65%)  1/01 -  guidant Discovery II DR pulse generator Abd pain 09/2002  H  pylori positive 05/2002 [facility] (Dr Aldean Ast) 07/2003 EGD neg. 07/2005 ABIs normal 10/2005  Family History: Reviewed history from 02/11/2009 and no changes  required. Mom died of old age Dad died of old age 31 brother died of accident Sister with DM  Social History: Reviewed history from 12/31/2008 and no changes required. 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  HOH NAD Neck:  supple, no masses, no thyromegaly, and no cervical lymphadenopathy.   Lungs:  normal respiratory effort, no intercostal retractions, no accessory muscle use, and normal breath sounds.   Heart:  normal rate, regular rhythm, no murmur, and no gallop.   Abdomen:  soft and non-tender.   Pulses:  faint in feet Extremities:  no sig edema Neurologic:  trouble getting up on table Memory problems---trouble with word finding ?slight apraxia with left hand Skin:  no suspicious lesions and no ulcerations.   Psych:  normally interactive, good eye contact, not anxious appearing, and not depressed appearing.    Diabetes Management Exam:    Foot Exam (with socks and/or shoes not present):       Sensory-Pinprick/Light touch:          Left medial foot (L-4): diminished          Left dorsal foot (L-5): diminished          Left lateral foot (S-1): diminished          Right medial foot (L-4): diminished          Right dorsal foot (L-5): diminished          Right lateral foot (S-1): diminished       Inspection:          Left foot: normal  Right foot: normal       Nails:          Left foot: fungal infection          Right foot: fungal infection   Impression & Recommendations:  Problem # 1:  DIABETES MELLITUS, TYPE II, WITH RENAL COMPLICATIONS (ICD-250.40) Assessment Unchanged will have him try off the glipizide I am really concerned about silent hypoglycemia esp with his neurologic decline  The following medications were removed from the medication list:    Glucotrol Xl 10 Mg Tb24 (Glipizide) .Marland Kitchen... 1 by mouth before breakfast His  updated medication list for this problem includes:    Lotensin 20 Mg Tabs (Benazepril hcl) .Marland Kitchen... 1 tab daily for high blood pressure    Aspirin 81 Mg Tbec (Aspirin) .Marland Kitchen... 1 by mouth daily    Glucophage 1000 Mg Tabs (Metformin hcl) .Marland Kitchen... 1 by mouth two times a day  Labs Reviewed: Creat: 1.3 (09/01/2010)     Last Eye Exam: no retinopathy (01/07/2009) Reviewed HgBA1c results: 6.5 (09/01/2010)  6.1 (04/24/2010)  Problem # 2:  CEREBROVASCULAR ACCIDENT, LATE EFFECTS (ICD-438.9) Assessment: Deteriorated deteriorating functional status  His updated medication list for this problem includes:    Aspirin 81 Mg Tbec (Aspirin) .Marland Kitchen... 1 by mouth daily    Plavix 75 Mg Tabs (Clopidogrel bisulfate) .Marland Kitchen... Take 1 tablet by mouth once daily  Problem # 3:  HYPERTENSION (ICD-401.9) Assessment: Unchanged up some today no changes for now though  His updated medication list for this problem includes:    Lotensin 20 Mg Tabs (Benazepril hcl) .Marland Kitchen... 1 tab daily for high blood pressure    Atenolol 50 Mg Tabs (Atenolol) .Marland Kitchen... 1 daily  BP today: 166/73 Prior BP: 154/84 (12/01/2010)  Prior 10 Yr Risk Heart Disease: N/A (04/07/2007)  Labs Reviewed: K+: 4.8 (09/01/2010) Creat: : 1.3 (09/01/2010)   Chol: 161 (09/01/2010)   HDL: 31.70 (09/01/2010)   LDL: 70 (12/06/2009)   TG: 352.0 (09/01/2010)  Problem # 4:  HYPERLIPIDEMIA (ICD-272.4) Assessment: Unchanged will reheck labs next time  His updated medication list for this problem includes:    Pravachol 20 Mg Tabs (Pravastatin sodium) .Marland Kitchen... 1 by mouth daily  Labs Reviewed: SGOT: 21 (09/01/2010)   SGPT: 10 (09/01/2010)  Lipid Goals: Chol Goal: 200 (04/07/2007)   HDL Goal: 40 (04/07/2007)   LDL Goal: 70 (04/07/2007)   TG Goal: 150 (04/07/2007)  Prior 10 Yr Risk Heart Disease: N/A (04/07/2007)   HDL:31.70 (09/01/2010), 37 (12/06/2009)  LDL:70 (12/06/2009), DEL (16/08/9603)  Chol:161 (09/01/2010), 178 (12/06/2009)  Trig:352.0 (09/01/2010), 356  (12/06/2009)  Problem # 5:  IRRITABLE BOWEL SYNDROME (ICD-564.1) Assessment: Improved ongoing GI problems actually seems better  He is not complaining of this too much  Problem # 6:  BENIGN PROSTATIC HYPERTROPHY (ICD-600.00) Assessment: Deteriorated worsening despite meds might have to consider procedure  Complete Medication List: 1)  Tamsulosin Hcl 0.4 Mg Caps (Tamsulosin hcl) .Marland Kitchen.. 1 cap daily to ease urination 2)  Lotensin 20 Mg Tabs (Benazepril hcl) .Marland Kitchen.. 1 tab daily for high blood pressure 3)  Aspirin 81 Mg Tbec (Aspirin) .Marland Kitchen.. 1 by mouth daily 4)  Glucophage 1000 Mg Tabs (Metformin hcl) .Marland Kitchen.. 1 by mouth two times a day 5)  Pravachol 20 Mg Tabs (Pravastatin sodium) .Marland Kitchen.. 1 by mouth daily 6)  Imdur 30 Mg Tb24 (Isosorbide mononitrate) .Marland Kitchen.. 1 by mouth daily 7)  Miralax Powd (Polyethylene glycol 3350) .Marland Kitchen.. 1 capful with water one to two times a day for constipation 8)  Finasteride 5 Mg Tabs (  Finasteride) .Marland Kitchen.. 1 by mouth daily 9)  Omeprazole 20 Mg Cpdr (Omeprazole) .Marland Kitchen.. 1 before breakfast and 1 at bedtime 10)  Atenolol 50 Mg Tabs (Atenolol) .Marland Kitchen.. 1 daily 11)  Clarinex 5 Mg Tabs (Desloratadine) .Marland Kitchen.. 1 once daily as needed by mouth 12)  Lorazepam 0.5 Mg Tabs (Lorazepam) .Marland Kitchen.. 1-2  at bedtime to help sleep and abdominal pain 13)  Plavix 75 Mg Tabs (Clopidogrel bisulfate) .... Take 1 tablet by mouth once daily 14)  Multivitamins Tabs (Multiple vitamin) .Marland Kitchen.. 1 by mouth daily 15)  Triamcinolone Acetonide 0.1 % Crea (Triamcinolone acetonide) .... Apply to rash behind ear three times a day as needed 16)  Lifescan Unistik Ii Lancets Misc (Lancets) .... Please use fine point, use as directed once daily 17)  Onetouch Ultra Test Strp (Glucose blood) .... Use once daily  Patient Instructions: 1)  Please stop glucotrol (glipizide) 2)  Please try tylenol (acetominophen) arthritis 650mg  at bedtime to see if that helps your sleep 3)  Please schedule a follow-up appointment in 4 months .   Prescriptions: PLAVIX 75 MG TABS (CLOPIDOGREL BISULFATE) take 1 tablet by mouth once daily  #90 x 3   Entered and Authorized by:   Cindee Salt MD   Signed by:   Cindee Salt MD on 12/05/2010   Method used:   Electronically to        [facility]* (mail-order)       [address]       [place], [place]  [address]       Ph: [phone]       Fax: [phone]   RxID:   [id number] OMEPRAZOLE 20 MG CPDR (OMEPRAZOLE) 1 before breakfast and 1 at bedtime  #90 x 3   Entered and Authorized by:   Cindee Salt MD   Signed by:   Cindee Salt MD on 12/05/2010   Method used:   Electronically to        [facility]* (mail-order)       [address]       [place], [place]  [address]       Ph: [phone]       Fax: [phone]   RxID:   [id number] MIRALAX  POWD (POLYETHYLENE GLYCOL 3350) 1 capful with water one to two times a day for constipation  #64months x 3   Entered and Authorized by:   Cindee Salt MD   Signed by:   Cindee Salt MD on 12/05/2010   Method used:   Electronically to        [facility]* (mail-order)       [address]       [place], [place]  [address]       Ph: [phone]       Fax: [phone]   RxID:   [id number] GLUCOPHAGE 1000 MG TABS (METFORMIN HCL) 1 by mouth two times a day  #180 x 3   Entered and Authorized by:   Cindee Salt MD   Signed by:   Cindee Salt MD on 12/05/2010   Method used:   Electronically to        [facility]* (mail-order)       [address]       [place],   [address]       Ph: [phone]       Fax: [phone]   RxID:   [id number] LOTENSIN 20  MG TABS (BENAZEPRIL HCL) 1 tab daily for high blood pressure  #90 x 3   Entered and Authorized by:   Cindee Salt MD   Signed by:   Cindee Salt MD on 12/05/2010   Method used:   Electronically to        [facility]*  (mail-order)       [address]       [place], [place]  [address]       Ph: [phone]       Fax: [phone]   RxID:   [id number]    Orders Added: 1)  Est. Patient Level IV [[address]]    Current Allergies (reviewed today): No known allergies   Appended Document: FOLLOW UP / LFW Quantity on omeprazole changed to 180, called to [facility].

## 2010-12-11 NOTE — Letter (Signed)
Summary: Device-Delinquent Phone Journalist, newspaper, Main Office  [address]   [place], [place] [address]   Phone: [phone]  Fax: [phone]     November 05, 2010 MRN: [id number]   AMANUEL SINKFIELD [address] [place], [place]  [address]   Dear Mr. MARKOS,  According to our records, you were scheduled for a device phone transmission on  09-04-2010.     We did not receive any results from this check.  If you transmitted on your scheduled day, please call us to help troubleshoot your system.  If you forgot to send your transmission, please send one upon receipt of this letter.  Thank you,  [name]  November 05, 2010 4:05 PM   [facility] certified

## 2010-12-11 NOTE — Progress Notes (Signed)
Summary: pacer check ?s   Phone Note Call from Patient Call back at [phone]   Caller: Son Call For: Gunnar Fusi Summary of Call: [name] called regarding his father Andres [name]. Mr.Stolze received a letter in the mail saying it was time for him to check his device over the phone. HIs son stated that his father did check his device over the phone and he has not received any results and they want to know what to do. Please advise. Initial call taken by: Lysbeth Galas CMA,  November 20, 2010 1:38 PM  Follow-up for Phone Call        Spoke with [name](son) and reviewed procedure for [facility] transmission.  He will help his Dad this evening  to send a transmission. Follow-up by: Altha Harm, LPN,  November 20, 2010 4:58 PM

## 2010-12-17 NOTE — Progress Notes (Signed)
   Phone Note Outgoing Call   Summary of Call: lmom letting pt know carelink transmission was received. [name]  December 09, 2010 5:40 PM

## 2010-12-26 ENCOUNTER — Encounter: Payer: Self-pay | Admitting: Internal Medicine

## 2011-01-06 NOTE — Cardiovascular Report (Signed)
Summary: Certified Letter Returned - Not doing f/u  Certified Letter Returned - Not doing f/u   Imported By: Debby Freiberg 01/01/2011 16:46:45  _____________________________________________________________________  External Attachment:    Type:   Image     Comment:   External Document

## 2011-03-05 ENCOUNTER — Encounter: Payer: Self-pay | Admitting: *Deleted

## 2011-03-08 ENCOUNTER — Encounter: Payer: Self-pay | Admitting: *Deleted

## 2011-03-23 ENCOUNTER — Other Ambulatory Visit: Payer: Self-pay | Admitting: Internal Medicine

## 2011-03-24 NOTE — Progress Notes (Signed)
[facility]                  [facility] OFFICE NOTE   NAME:Flemings, VASILIS LUHMAN                      MRN:          [id number]  DATE:11/15/2007                            DOB:          08-Feb-1926    Mr. Blakely is seen in follow-up for complete heart block, status post  pacemaker implantation.  He is doing quite well without complaints of  chest pain, shortness of breath, exercise intolerance or edema.   His medication list is legion.  It is reviewed and is unchanged.   On examination today, his blood pressure is 108/60, his pulse is 76.  LUNGS:  Clear.  His heart sounds are regular.  EXTREMITIES:  Without edema.  SKIN:  Warm and dry.   Interrogation of his Guidant Discovery pulse generator demonstrates that  he is approaching ERI.  He has no intrinsic ventricular rhythm with an  impedance of 790, a threshold of 0.8 at 0.4, the P wave was 3.4 with an  impedance of 430 and a threshold of 0.6 at 0.4.   We will plan to see him again in the device clinic in 2 months' time.     Duke Salvia, MD, [facility]  Electronically Signed    SCK/MedQ  DD: 11/15/2007  DT: 11/16/2007  Job #: [id number]

## 2011-03-24 NOTE — Progress Notes (Signed)
[facility]                  [facility] OFFICE NOTE   NAME:Behrle, EVAAN TIDWELL                      MRN:          [id number]  DATE:07/30/2008                            DOB:          06-01-1926    Mr. Siedschlag is seen in follow up for pacemaker implanted in 2001, for  complete heart block.  He is approaching ERI and has been at the same  point of less than 6 months now for about 5 months.  There is no obvious  change in his battery voltage.   I should note also that his estimated longevity in February 2008, was a  year.   In any case, he has no complaints relate to his heart.  His biggest  issue with his bowels.  He has seen GI.  He is seeing Dr. Alphonsus Sias and  they we have nothing left to do.   His blood pressure is mildly elevated today at 153/78.  This is true  notwithstanding his benazepril 20 and atenolol 50.  His other  medications include Glucophage, Imdur 30, aspirin, glipizide, Proscar,  and omeprazole.   Other vital signs include pulse of 81, weight of 140, which is down 4  pounds in 3 months and is, otherwise, stable over the last year or two.  The lungs were clear.  The neck veins were flat.  The heart sounds were  regular.  Extremities were without edema.   Interrogation of his Guidant pulse generator demonstrates again that he  is approaching ERI   IMPRESSION:  1. Complete heart block.  2. Status post pacer for the above 3 prolonged atrioventricular delay      with reprogramming.  The shortening of his atrioventricular delay      from 190-150, which translates to measured A sense, V paced of      about 200 ms.  3. High-grade coronary artery disease in 2001, with an occluded mid      left anterior descending artery, high-grade 95% right coronary      artery, and tandem 70% lesions in the circ with right to left      collaterals, a nonischemic Myoview and medical therapy.  4. Gastrointestinal difficulties.   Mr.  Kolbe is stable from any arrhythmia point-of-view.  His device was  reprogrammed as noted.  We will see him again in an 8 weeks as he  approaches ERI.   I have given the name of lactulose, I did suggest that he could talk  about with his GI doctors, I do know whether it is appropriate or  inappropriate, but may be it will help.    Duke Salvia, MD, [facility]  Electronically Signed   SCK/MedQ  DD: 07/30/2008  DT: 07/31/2008  Job #: [id number]   cc:   Karie Schwalbe, MD

## 2011-03-24 NOTE — Assessment & Plan Note (Signed)
[facility]                         ELECTROPHYSIOLOGY OFFICE NOTE   NAME:[name], Andres HESSLING                      MRN:          [id number]  DATE:09/02/2007                            DOB:          November 14, 1925    Andres [name] was seen in the [facility] today on September 02, 2007,  for followup of his Guidant, model #1286 Discovery II.  Date of implant  was December 08, 1999, for complete heart block.   On interrogation of his device today,  His battery voltage is good.  He has about 5% remaining.  P waves measured greater than 3.5 millivolts with an atrial capture  threshold of 0.7 volts at 0.4 milliseconds and an atrial lead impedance  of 430 ohms.  R waves are not measured.  He is pacemaker dependent to a rate of 30 with a ventricular capture  threshold of 0.9 volts at 0.4 milliseconds and a ventricular lead  impedance of 840 ohms.  There were no episodes noted.  He is ventricularly pacing 98% of the  time.  No changes were made in his parameters.   He will come back in 3 months' time to see Dr. Graciela Husbands.      Altha Harm, LPN  Electronically Signed      Duke Salvia, MD, [facility]  Electronically Signed   PO/MedQ  DD: 09/02/2007  DT: 09/02/2007  Job #: [id number]

## 2011-03-24 NOTE — Progress Notes (Signed)
[facility]                  [facility]' OFFICE NOTE   NAME:Nofziger, PER BEAGLEY                      MRN:          [id number]  DATE:01/21/2009                            DOB:          Jan 12, 1926    Andres [name] returns today for followup of his pacemaker as well as for  preop surgical evaluation for possible upcoming carotid vascular  procedure.   HISTORY OF PRESENT ILLNESS:  The patient is a very pleasant 77 year old  man who has a history of symptomatic bradycardia and high-grade heart  block who underwent permanent pacemaker insertion initially back in  2001.  He underwent pacemaker generator change in 2009 by my partner,  Berton Mount.  At the time of his catheterization back in 2001, he had  ischemic heart disease with preserved LV systolic function.  He has  diabetes.  He, however, since his catheterization has been quite stable.  He denies chest pain.  He denies shortness of breath.  The patient has  recently been diagnosed with bilateral carotid disease and is being  considered for carotid endarterectomy versus percutaneous intervention.   His past medical history is notable for:  1. Diabetes.  2. Hypertension.  3. Prostate problems.   His medications include:  1. Glucophage 1 g twice a day.  2. Isosorbide ER 60 mg half tablet daily.  3. Aspirin 81 a day.  4. Multivitamin.  5. Stool softener.  6. Pravachol 20 mg a day.  7. Glipizide 10 a day.  8. Atenolol 50 a day.  9. Benazepril 20 a day.  10.Plavix 75 a day.  11.Omeprazole 20 a day.   On exam, he is a pleasant elderly-appearing man in no acute distress.  Blood pressure was 180/75, pulse was 77 and regular, respirations were  18.  Weight was 133 pounds.  Neck revealed no jugular venous distention.  There was no thyromegaly.  The trachea was midline.  The lungs were  clear bilaterally to auscultation.  No wheezes, rales, or rhonchi were  present.  There was no  increased work of breathing.  Cardiovascular exam  revealed a regular rate and rhythm.  Normal S1 and S2.  There was a  great 2/6 systolic murmur heard best at the right upper sternal border.  The A2 was well heard.  Abdominal exam was soft and nontender.  There  was no organomegaly.  Extremities demonstrated no cyanosis, clubbing, or  edema.  The pulses were 1+ and symmetric.   Interrogation of his pacemaker demonstrates Medtronic [place].  The P-  waves were greater than 2.  There were no R-waves secondary to complete  heart block.  His impedance was 1300 in the ventricle and 512 in the  right atrium.  Threshold was 0.5 at 0.4 in the A and 1 at 0.4 in the RV.  His battery voltage was 2.8 V.   IMPRESSION:  1. Possible carotid endarterectomy versus percutaneous procedure      secondary to bilateral carotid artery disease.  2. Complete heart block status post pacemaker insertion.  3. Coronary disease with preserved left ventricular function and no      symptoms currently of  angina nor of any congestive heart failure.   DISCUSSION:  I discussed the treatment options with Andres [name] and his  son who is with him today.  At this point, the major question is whether  carotid endarterectomy or a percutaneous procedure is warranted and  whether the patient would be a surgical candidate.  While the patient  does have a small-to-moderate risk for carotid endarterectomy, I think  overall his surgical risk is still fairly low.  With that, I would  recommend allowing him to proceed as needed.  If his symptoms were to  change, obviously we would reconsider this recommendation though I think  at this point with no anginal symptoms and no shortness of breath and  the ability to walk despite using a walker that his risks should be  acceptable.  The patient will require a magnet be placed on his  pacemaker during the procedure so that electrocautery use if he feels  the endarterectomy were out will not  inhibit pacing.     Doylene Canning. Ladona Ridgel, MD  Electronically Signed    GWT/MedQ  DD: 01/21/2009  DT: 01/22/2009  Job #: [id number]   cc:   Levora Dredge, MD  Karie Schwalbe, MD

## 2011-03-24 NOTE — Assessment & Plan Note (Signed)
[facility]                         ELECTROPHYSIOLOGY OFFICE NOTE   NAME:Andres [name], Andres [name]                      MRN:          [id number]  DATE:04/05/2007                            DOB:          1925-12-14    Mr. Andres [name] comes in today.  He has a pacemaker for complete heart block.  He is approaching end of life.  He is doing well.  There is some issue  about insurance coverage of his Teletrace, and so he comes in in person.   PHYSICAL EXAMINATION:  VITAL SIGNS:  His blood pressure is 130/68, pulse  of 78.  LUNGS:  Clear.  HEART:  Heart sounds were regular.   Interrogation of his device estimates estimated longevity of about 1  year.  His P wave was 3.6 with impedance of 460, threshold of 0.5 at  0.4.  There is no underlying ventricular rhythm with impedance of 840,  threshold of 0.9 at 0.4.   We will look again into insurance coverage for his Teletraces.  If we  can accomplish that, we will plan to see him again in 9 months time.     Duke Salvia, MD, [facility]  Electronically Signed    SCK/MedQ  DD: 04/05/2007  DT: 04/05/2007  Job #: [id number]   cc:   Karie Schwalbe, MD

## 2011-03-24 NOTE — Letter (Signed)
May 20, 2009    Karie Schwalbe, MD  [address] [place], [place] [address]   RE:  NUH, LIPTON  MRN:  [id number]  /  DOB:  11-29-1925   Dear Gerlene Burdock:   Mr. Portner comes in today having undergone his carotid endarterectomy  without untoward effects.  His major issue now is balance.  He finds  that if he turns or rotates that he is left with poor balance.  He has  difficulty managing steps.  We witnessed both of these things here today  in the office.  He denies orthostatic dizziness.   He has had no chest pain or shortness of breath.   His medications are reviewed and notable for the intercurrent addition  of Plavix.  He also takes a stool softener and MiraLax as well as  Metamucil, Glucophage, isosorbide, Pravachol, glipizide, atenolol, and  benazepril.   PHYSICAL EXAMINATION:  GENERAL:  His heart rate is 65.  NECK:  Neck veins were 7.  LUNGS:  Clear.  HEART:  Sounds were regular without murmurs.  ABDOMEN:  Soft.  EXTREMITIES:  No edema.  His finger-to-nose was quite good bilaterally.  His Romberg was somewhat positive with loss of balance going  posteriorly.  However, as noted when he tried to maneuver the step on  the examining table or tried to rotate while standing, he had  significant issues.   Interrogation of his Medtronic pulse generator and identifies a P-wave  of 2 or so.  There is no intrinsic ventricular rhythm.  The atrial  threshold was 0.5  The ventricular threshold 0.75 with an impedance of  451 and 1182, respectively.  Heart rate excursion was okay.  There is a  100% ventricular pacing.   IMPRESSION:  1. Complete heart block.  2. Status post pacer for the above (MDT).  3. Status post carotid endarterectomy.  4. Hypertension.  5. Balance issues.   Richard, I think the big issue for Mr. Ashmore is his balance.  I have  asked that he followup with you about this, as I do not think it is  hemodynamic.  He also had concerns about his bowels.   He is taking a  couple of stool softeners already and suggest that he followup with you  regarding that also.   We will see him again in 6 months' time.  Please let me know if there is  anything we can do in the interim.    Sincerely,      Duke Salvia, MD, [facility]  Electronically Signed    SCK/MedQ  DD: 05/20/2009  DT: 05/20/2009  Job #: [id number]

## 2011-03-24 NOTE — Letter (Signed)
September 17, 2008    Karie Schwalbe, MD  [address] [place], [place] [address]   RE:  Andres [name]  MRN:  [id number]  /  DOB:  [birth year]   Dear Andres [name],   It was a pleasure to see Andres [name] today in follow up for his  pacemaker.  He comes in and he has reached elective replacement  interval.  He has no complaints of chest pain or shortness of breath.  He has had no peripheral edema.   His pacemaker was implanted in 2001 for complete heart block.  He  received a Guidant Discovery dual-chamber pulse generator.  The details  of his lead are not available.  He has a history of ischemic heart  disease, and catheterization from 2001 demonstrated totaled LAD, high-  grade RCA, and moderately severe circumflex disease with normal left  ventricular function.   About a week or so ago, the patient fell going to his mailbox.  He was  seen in the emergency room.  His evaluation was negative.   He felt that he did not pass out, but actually tripped.   His medications include:  1. Glucophage 1000 b.i.d.  2. Isosorbide 30 a day.  3. Aspirin 81.  4. Pravachol 20.  5. Glipizide 10.  6. Atenolol 50.  7. Proscar 5.  8. Benazepril 20.  9. Omeprazole 20.   He has no known drug allergies.   On examination, today his blood pressure was quite elevated, it was  189/70.  His pulse was 88.  His weight was 136 which is down about 4  pounds in the last month and half and 8 pounds in the last 4 months.  His neck veins were flat.  His lungs were clear.  Heart sounds were  regular with an early systolic murmur.  The abdomen was soft.  The  extremities were without edema.   Interrogation of his Guidant 1286 device demonstrated that he is 100%  ventricularly paced.  Impedance measurements were normal.  There were no  ventricular high-rate episodes identified.   IMPRESSION:  1. Complete heart block.  2. Status post pacer for the above.  3. Ischemic heart disease with most recent  assessment of left      ventricular function some years ago.  4. Intercurrent fall, question related to coronary disease and      ventricular tachycardia.   We will plan to check an echo to see if this substrate has significantly  changed for Andres [name].  In the event that it has, we will need to  consider catheterization and/or Myoview scanning in anticipation of  device generator replacement with a question at that point being  pacemaker replacement or ICD insertion.  Given his age and his frailty,  this will not be an easy decision.    Sincerely,      Andres Salvia, MD, [facility]  Electronically Signed    SCK/MedQ  DD: 09/17/2008  DT: 09/18/2008  Job #: [id number]

## 2011-03-24 NOTE — Assessment & Plan Note (Signed)
[facility]                         ELECTROPHYSIOLOGY OFFICE NOTE   NAME:Andres [name], Andres [name]                      MRN:          [id number]  DATE:06/13/2007                            DOB:          June 13, 1926    Mr. Mcgrory was seen today in the clinic on June 13, 2007, for followup  of his Guidant, Model Number 1286, Discovery 2.  Date of implant was  December 08, 1999, for complete heart block.   On interrogation of his device today, his battery voltage has maybe 5%  left on it, and he is pacemaker dependent to a rate of 30.  P waves  measured 3.4 mV with an atrial capture threshold of 0.6 volts at 0.4  msec and an atrial lead impedance of 430 ohms.  As I said before, he is  pacemaker dependent to a rate of 30, so his R waves were not measured  with a ventricular pacing threshold of 1 volt at 0.4 msec and a  ventricular lead impedance of 780 ohms.   No changes were made in his parameters today.  He will be seen again in  1 month's time.      Altha Harm, LPN  Electronically Signed      Duke Salvia, MD, [facility]  Electronically Signed   PO/MedQ  DD: 06/13/2007  DT: 06/13/2007  Job #: [id number]

## 2011-03-24 NOTE — Progress Notes (Signed)
[facility]                   ARRHYTHMIA ASSOCIATES' OFFICE NOTE   NAME:Andres [name], Andres [name]                      MRN:          [id number]  DATE:10/22/2008                            DOB:          [birth year]    Andres [name] is seen in followup for a pacemaker implanted in 2001 for a  complete heart block.  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  He has now  reached ERT.  His is reverted to DDD from the DDDR and has marked  exercise intolerance.   He has a history of ischemic heart disease with catheterization in 2001  demonstrating totaled LAD and high-grade RCA and moderate circumflex  disease; his LV function was normal.  Repeat echo within the last month  was also normal.   PAST MEDICAL HISTORY:  In addition to the above is notable for diabetes,  dyslipidemia and hypertension.   MEDICATIONS:  Include:  1. Glucophage 1000 b.i.d.  2. Isosorbide 30.  3. Aspirin 81.  4. Pravachol 20.  5. Glipizide 10.  6. Atenolol 50.  7. Finasteride 5.  8. Benazepril 20.  9. Omeprazole 20.   He has no known drug allergies.   PHYSICAL EXAMINATION:  His blood pressure was 176/76.  NECK:  Veins were flat.  LUNGS:  Clear.  HEART:  Sounds were regular and distant.  ABDOMEN:  Soft.  The extremities were without edema.  NEUROLOGICAL:  Grossly normal.   In interrogated his device.  He reached ERT in September. He has  reverted as noted.   IMPRESSION:  1. Complete heart block.  2. Status post pacemaker per #1 now at ERT.  3. Diabetes.  4. Hypertension.  5. Ischemic heart disease with normal left ventricular function.   Andres [name] needs to undergo pacemaker generator placement.  We have  discussed the potential benefits as well as potential risks including  but not limited to infection.   We will plan to explant his [facility] and implant either  St. Jude or a Medtronic depending on what the hospital has available.   We will also need to  work on getting his blood pressure down and we will  probably plan to increase his benazepril and add a diuretic to it at the  time of discharge.     Duke Salvia, MD, [facility]  Electronically Signed    SCK/MedQ  DD: 10/22/2008  DT: 10/22/2008  Job #: [id number]   cc:   Karie Schwalbe, MD

## 2011-03-24 NOTE — Assessment & Plan Note (Signed)
[facility]                         ELECTROPHYSIOLOGY OFFICE NOTE   NAME:Waas, REILY ILIC                      MRN:          [id number]  DATE:07/26/2007                            DOB:          1926-09-03    Mr. Petroni is seen.  He is status post pacemaker implantation for  recurrent syncope and complete heart block.  He also has coronary artery  disease, which is being managed medically.  He has had no problems with  chest pain or shortness of breath.   His medication list is quite long.  It is not reviewed here.   EXAMINATION:  His blood pressure is 150/72, his pulse is 67.  This is  actually somewhat notable because he has diabetes.  LUNGS:  Clear.  HEART SOUNDS:  Regular.  EXTREMITIES:  Without edema.  ABDOMEN:  Soft with active bowel sounds.   Interrogation of his Guidant Discovery pulse generator demonstrates that  he is approaching ERI.   IMPRESSION:  1. Complete heart block.  2. Status post pacemaker for #1.  3. Ischemic heart disease on medical therapy.  4. Diabetes.  5. Hypertension.   Mr. Morrish is stable from the arrhythmia point of view.  We plan to see  him in one month's time in the [place] office.  That has been  arranged.   We will also have him follow up with Dr. Alphonsus Sias about his blood  pressure, as his target should be about 130 with his diabetes.     Duke Salvia, MD, [facility]  Electronically Signed    SCK/MedQ  DD: 07/26/2007  DT: 07/27/2007  Job #: [id number]   cc:   Karie Schwalbe, MD  [facility]

## 2011-03-24 NOTE — Progress Notes (Signed)
[facility] ARRHYTHMIA ASSOCIATES' OFFICE NOTE   NAME:Andres [name], Andres [name]                      MRN:          [id number]  DATE:11/12/2008                            DOB:          28-Aug-1926    Andres [name] returns today for followup.  He is a very pleasant elderly  male with complete heart block, status post pacemaker insertion, who  underwent recent pacemaker generator change secondary to his device  reaching elective replacement indication.  The patient's main complaint  today is that of fatigue, malaise, and weight loss.  He denies any  change in his diet.  He denies a lack of p.o. intake.  Three years ago,  he weighed 150 pounds.  He was 137 in December.  He is 135 today.  He  denied chest pain.  He denies fevers or chills.  He does note some  difficulty with bowel movements, but has been seen by the  gastrointestinal doctors and told that in his report that nothing else  could be done  for him about this.   MEDICATIONS:  1. Glucophage 1 g twice a day.  2. Isosorbide 60 mg half tablet daily.  3. Aspirin 81 a day.  4. Stool softener.  5. Pravachol 40 mg half tablet daily.  6. Glipizide 10 mg daily.  7. Atenolol 50 mg daily.  8. Benazepril 20 mg daily.  9. Omeprazole 20 a day.  10.Metamucil daily.   PHYSICAL EXAMINATION:  GENERAL:  He is a pleasant, elderly-appearing man  in no acute distress.  VITAL SIGNS:  Blood pressure is 132/62, the pulse 80 and regular, and  respirations were 18.  The weight was 135 pounds.  NECK:  No jugular venous distention.  LUNGS:  Clear bilaterally to auscultation.  No wheezes, rales, or  rhonchi are present.  There is no increased work of breathing.  CARDIOVASCULAR:  Regular rate and rhythm.  Normal S1 and S2.  ABDOMEN:  Soft and nontender.  CHEST:  His pacemaker insertion site was healing nicely.  EXTREMITIES:  No edema.   Interrogation of his pacemaker demonstrates Medtronic Luis Llorons Torres, it  was  implanted for complete heart block.  There were no R-waves.  The P-waves  were 2, the impedance is 534 in the A and 1100 in the V, the threshold  0.5 at 0.4 in the A and 0.875 at 0.4 in the RV.  The battery voltage was  2.8 volts.  He was 100% V paced, less than 1% A paced.   IMPRESSION:  1. Fatigue, malaise, weakness, and weight loss.  2. Complete heart block, status post pacemaker insertion, longstanding      with recent pacemaker generator change.   DISCUSSION:  The etiology of Andres [name] symptoms is unclear to me.  I  had initially planned to obtain a CBC, a TSH, and a liver panel today,  but the patient tells me that he has seen Dr. Alphonsus Sias, who is his primary  physician, in the next week, and would prefer to have the blood work  done in his office.  I concur with this.  The patient will follow up  with  Dr. Graciela Husbands as previously scheduled.     Doylene Canning. Ladona Ridgel, MD  Electronically Signed    GWT/MedQ  DD: 11/12/2008  DT: 11/13/2008  Job #: [id number]   cc:   Karie Schwalbe, MD

## 2011-03-27 NOTE — Assessment & Plan Note (Signed)
[facility]                         ELECTROPHYSIOLOGY OFFICE NOTE   NAME:Andres [name], Andres [name]                      MRN:          [id number]  DATE:12/31/2006                            DOB:          1926/09/26    Andres [name] comes in today for a pacemaker check for complete heart  block.  He is approaching end of life.  He has no complaints of chest  pain, shortness of breath, fatigue or syncope.   CURRENT MEDICATIONS INCLUDE:  1. Benazepril 20.  2. Pravastatin 20.  3. Atenolol 50.  4. Finasteride 5.  5. Glipizide 10.  6. Isosorbide 30.  7. Aspirin.  8. Prevacid.  9. Glucophage.   ON EXAMINATION:  His blood pressure is 130/76, his pulse is 93.  Lungs  were clear.  Heart sounds were regular and the extremities were without  edema.   Interrogation of his Guidant Discovery 2 pulse generator demonstrates P-  wave of 3 with impedance of 430, a threshold of 0.5 at 0.4.  There is no  intrinsic ventricular rhythm with an impedance of 790, a threshold of  0.9 at 0.4.  The battery voltage has an estimated longevity of one year.  He is 100% ventricularly paced.   IMPRESSION:  1. Complete heart block.  2. Status post pacer for the above.  3. Diabetes.  4. Hypertension.  5. Ischemic heart disease with normal left ventricular function on      medical therapy.   Mr. Bun is stable.  We will see him again in three months' time, as  there is some issue with his insurance company coverage of  transtelephonic monitoring.     Duke Salvia, MD, [facility]  Electronically Signed    SCK/MedQ  DD: 12/31/2006  DT: 12/31/2006  Job #: [id number]   cc:   Karie Schwalbe, MD

## 2011-03-27 NOTE — Assessment & Plan Note (Signed)
[facility]                           GASTROENTEROLOGY OFFICE NOTE   NAME:WRIGHTNicolus, Ose                      MRN:          [id number]  DATE:08/04/2006                            DOB:          07/19/1926    REFERRING PHYSICIAN:  Karie Schwalbe, MD   REASON FOR CONSULTATION:  1. Chronic abdominal complaints.  2. Constipation.  3. Rectal bleeding.   HISTORY:  This is a 77 year old gentleman with chronic abdominal complaints,  who was referred through the courtesy of Dr. Alphonsus Sias regarding the above-  listed issues.  The patient was evaluated on April 30, 2006, for abdominal  discomfort and constipation.  See that dictation for details.  Those  complaints persist.  He has had extensive workups in the past including  colonoscopy, upper endoscopy, laboratories and imaging including ultrasound  and CT scans.  He has been given a number of regimens to assist with his  bowel habits.  His compliance is questionable.  He is felt to have  constipation-predominant irritable bowel syndrome, and we have had several  discussions in 2001, 2005 and 2007.  His insight seems to be poor.  Currently, he describes a sensation that he needs to defecate.  This occurs  at night.  He sits on the commode and strains without defecating.  He does  state he has bloating and passes a lot of flatus.  When he does have a bowel  movement, he sees blood on the tissue.  Fullness sensation in the rectum.  His last colonoscopy in 2001 was negative except for diverticulosis and  hemorrhoids.  Evaluation at that time was for the same complaints.  He has  had no nausea, vomiting, fevers or weight loss.  Food does not affect his  symptoms.   PAST MEDICAL HISTORY:  1. Diabetes.  2. Hypertension.  3. Arthritis.  4. Hyperlipidemia.  5. Prior pacemaker placement.  6. Status post cholecystectomy.  7. Status post surgery for benign prostatic hypertrophy.   CURRENT MEDICATIONS:  1.  Lisinopril 20 mg daily.  2. Glucophage 1 gram twice daily.  3. Pepcid 30 mg daily.  4. Isorbid 30 mg daily.  5. Aspirin 81 mg daily.  6. Multivitamin.  7. Fiber-Con.  8. Stool softener.  9. GlycoLax 17 grams daily.  10.Pravachol 20 mg.  11.Glipizide 10 mg daily.  12.Atenolol 50 mg daily.  13.Proscar 5 mg daily.   ALLERGIES:  No known drug allergies.   FAMILY HISTORY:  Negative for relevant GI disorders.   SOCIAL HISTORY:  The patient is married with 5 children.  He does not smoke  or use alcohol.   PHYSICAL EXAMINATION:  GENERAL:  Elderly male in no acute distress.  He is  alert and oriented.  VITAL SIGNS:  Blood pressure 128/66, heart rate 66, weight 150.2 pounds  (increased 2 pounds).  HEENT:  Sclerae are anicteric.  Conjunctivae are pink.  Oral mucosa intact.  Mucous membranes are moist.  LUNGS:  Clear.  HEART:  Regular.  ABDOMEN:  Soft without tenderness, mass or hernia.  Good bowel sounds heard.  RECTAL:  Large, friable external  hemorrhoids.  No internal mass.  Stool is  brown and Hemoccult negative.  EXTREMITIES:  Without edema.   IMPRESSION:  1. Constipation-predominant irritable bowel syndrome.  Ongoing.  Poor      insight.  2. Large, symptomatic external hemorrhoids.   RECOMMENDATIONS:  1. Metamucil 2 tablespoons at night in 12 ounces of water.  2. GlycoLax 34 grams in 16 ounces of water each morning.  3. Discussion today regarding the chronic nature of irritable bowel.  4. Analpram 2.5% cream to hemorrhoids.  5. Surgical referral regarding possible hemorrhoidectomy.  6. Resume general medical care with Dr. Alphonsus Sias.            ______________________________  Wilhemina Bonito. Eda Keys., MD    JNP/MedQ  DD:  08/04/2006  DT:  08/06/2006  Job #:  [id number]   cc:   Karie Schwalbe, MD
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
## 2011-03-27 NOTE — Op Note (Signed)
[place]. [facility]  Patient:    Andres [name]                         MRN: [id number] Proc. Date: 12/08/99 Adm. Date:  [id number] Attending:  Talitha Givens                           Operative Report  PREOPERATIVE DIAGNOSIS:  Complete heart block.  POSTOPERATIVE DIAGNOSIS:  Complete heart block.  PROCEDURE:  DDD implant with contrast venography.  SURGEON:  Nathen May, M.D. [facility].  ANESTHESIA:  DESCRIPTION OF PROCEDURE:  Following obtaining informed consent, the patient was brought to the electrophysiology laboratory and placed on the fluoroscopic table in the supine position.  After routine prep and drape of the left upper chest, intravenous contrast was injected via the left antecubital vein to identify the  course and the patency of the extrathoracic left subclavian vein.  With this having been accomplished, lidocaine was infiltrated into the prepectoral subclavicular  region.  An incision was made and carried down to the layer of the prepectoral fascia using electrocautery.  A pocket was formed using electrocautery. Hemostasis was obtained.  Thereafter, attention was turned to gaining access of the extrathoracic left subclavian vein which was accomplished without difficulty without the puncture f the artery or aspiration of air.  Two guidewires were placed on separate venipunctures, retained, and a 0 silk suture was placed in a figure-of-eight fashion and allowed to hang loosely.  Sequentially, a 9-French tear-away introducer sheath was placed to allow for the sequential passage of a [employer] 6940 58 cm active fixation ventricular lead,  serial #[id number], and a pacesetter 1342T passive fixation atrial lead, serial  #[id number].  Under fluoroscopic guidance, these were manipulated into the right ventricular apex and the right atrial appendage respectively with a bipolar R-wave of 8.5 mV, a pacing impedance of 800 ohms,  and a pacing threshold ______ of 1.7 V at 0.5 msec, a currented threshold of 2.1 mA and there was no diaphragmatic pacing at 10 V.  The bipolar P-wave was 3.3 mV with a pacing impedance of 520 ohms and a pacing threshold of 0.5 msec at 0.6 V, and a currented threshold of 1.1 mA.  With these acceptable parameters recorded, the leads were secured to the prepectoral fascia and a hemostatic suture was secured.  The pocket was irrigated copiously with antibiotic containing saline solution, and the wounds were then attached to a guidant Discovery II DR pulse generator, model 1286, serial [id number]. Magnet exposure demonstrated A-V pacing at 100 beats per minute.  The leads in the pulse generator were then placed in the pocket, and then secured to the prepectoral fascia.  The wound was closed in three layers in a normal fashion.  The wound was washed and dried, and a Benzoin and Steri-Strips dressing was then applied.  Needle counts, sponge counts, and instrument counts were correct at the end of the procedure according to the nurses.  The patient tolerated the procedure well without apparent complications.  The patients pacemaker was programmed to the DDD mode at 60-120. DD:  12/08/99 TD:  12/09/99 Job: [id number]

## 2011-03-27 NOTE — Assessment & Plan Note (Signed)
[facility]                           ELECTROPHYSIOLOGY OFFICE NOTE   NAME:[name], Andres PUGLIA                      MRN:          [id number]  DATE:08/02/2006                            DOB:          1926-08-12    Andres [name] was seen today in the clinic on August 02, 2006 for followup  of his Guidant model #1286 Discovery. Date of implant was December 08, 1999  for complete heart block. On interrogation of his device today, his battery  is good. He has about 25% left. T waves measured 4.5 millivolts with an  atrial capture threshold at 0.6 volts at 0.4 milliseconds and an atrial lead  impedence of 460 ohms. R waves measured 25.6 millivolts with a ventricular  pacing threshold of 0.8 volts at 0.4 milliseconds and a ventricular lead  impedence of 840 ohms. No changes were made in his parameters today. He will  continue with his monthly telephone checks with a return office visit in one  year's time.                                   Altha Harm, LPN                                Duke Salvia, MD, [facility]   PO/MedQ  DD:  08/02/2006  DT:  08/04/2006  Job #:  [id number]
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
## 2011-04-01 ENCOUNTER — Encounter: Payer: Self-pay | Admitting: Internal Medicine

## 2011-04-02 ENCOUNTER — Ambulatory Visit (INDEPENDENT_AMBULATORY_CARE_PROVIDER_SITE_OTHER): Payer: Medicare Other | Admitting: Internal Medicine

## 2011-04-02 ENCOUNTER — Encounter: Payer: Self-pay | Admitting: Internal Medicine

## 2011-04-02 VITALS — BP 156/78 | HR 72 | Temp 97.9°F | Ht 67.0 in | Wt 129.0 lb

## 2011-04-02 DIAGNOSIS — K219 Gastro-esophageal reflux disease without esophagitis: Secondary | ICD-10-CM

## 2011-04-02 DIAGNOSIS — I6990 Unspecified sequelae of unspecified cerebrovascular disease: Secondary | ICD-10-CM

## 2011-04-02 DIAGNOSIS — I10 Essential (primary) hypertension: Secondary | ICD-10-CM

## 2011-04-02 DIAGNOSIS — E785 Hyperlipidemia, unspecified: Secondary | ICD-10-CM

## 2011-04-02 DIAGNOSIS — I2510 Atherosclerotic heart disease of native coronary artery without angina pectoris: Secondary | ICD-10-CM

## 2011-04-02 DIAGNOSIS — E1129 Type 2 diabetes mellitus with other diabetic kidney complication: Secondary | ICD-10-CM

## 2011-04-02 NOTE — Patient Instructions (Signed)
Please try boost or ensure twice a day between meals. Try to get out some and be around other people

## 2011-04-02 NOTE — Progress Notes (Signed)
Subjective:    Patient ID: Andres [name], male    DOB: January 02, 1926, 77 y.o.   MRN: [id number]  HPI Checks sugars daily fasting Now up to 190 or 200--concerned about this No hypoglycemic spells  Notes some trouble with stomach Waking him up early in Am---can't pass stool, or it is hard  Eating okay--he thinks he is doing well Has lost weight though  Voiding okay at times occ slow Nocturia x 1 usually  Generally not happy about things Worried about stomach Doesn't endorse depression but does seem to have anhedonia  No heart pain No SOB No palpitations No edema  Past Medical History  Diagnosis Date  . CAD (coronary artery disease)   . Diverticulitis   . GERD (gastroesophageal reflux disease)   . Hyperlipidemia   . Hypertension   . BPH (benign prostatic hypertrophy)   . Diabetes mellitus   . DJD (degenerative joint disease)     lumbar spine  . IBS (irritable bowel syndrome)   . Stroke     Past Surgical History  Procedure Date  . Angioplasty 1995  . Doppler echocardiography 11/1999    syncope  . Pacemaker insertion     Pacer/cath  (ef 65%)  1/01 -  guidant Discovery II DR pulse generator  . Prostate surgery 07/2003  . Esophagogastroduodenoscopy 07/2005    neg    Family History  Problem Relation Age of Onset  . Diabetes Sister     History   Social History  . Marital Status: Widowed    Spouse Name: N/A    Number of Children: 5  . Years of Education: N/A   Occupational History  . 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 Smoking status: Former Games developer  . Smokeless tobacco: Never Used  . Alcohol Use: No     heavy in the past  . Drug Use: Not on file  . Sexually Active: Not on file   Other Topics Concern  . Not on file   Social History Narrative   Lives with son and DIL    Current outpatient prescriptions:aspirin 81 MG tablet, Take 81 mg by mouth daily.  , Disp: , Rfl: ;  atenolol (TENORMIN) 50 MG tablet,  Take 50 mg by mouth daily.  , Disp: , Rfl: ;  benazepril (LOTENSIN) 20 MG tablet, Take 20 mg by mouth daily.  , Disp: , Rfl: ;  clopidogrel (PLAVIX) 75 MG tablet, Take 75 mg by mouth daily.  , Disp: , Rfl: ;  finasteride (PROSCAR) 5 MG tablet, Take 5 mg by mouth daily.  , Disp: , Rfl:  isosorbide mononitrate (IMDUR) 30 MG 24 hr tablet, Take 30 mg by mouth daily.  , Disp: , Rfl: ;  LIFESCAN FINEPOINT LANCETS MISC, USE AS DIRECTED ONCE DAILY, Disp: 100 each, Rfl: 10;  metFORMIN (GLUCOPHAGE) 1000 MG tablet, Take 1,000 mg by mouth 2 (two) times daily with a meal.  , Disp: , Rfl: ;  Multiple Vitamin (MULTIVITAMIN) capsule, Take 1 capsule by mouth daily.  , Disp: , Rfl:  omeprazole (PRILOSEC) 20 MG capsule, Take 20 mg by mouth daily.  , Disp: , Rfl: ;  ONE TOUCH ULTRA TEST test strip, TEST AS DIRECTED EVERY DAY, Disp: 100 each, Rfl: 10;  pravastatin (PRAVACHOL) 20 MG tablet, Take 20 mg by mouth daily.  , Disp: , Rfl: ;  Tamsulosin HCl (FLOMAX) 0.4 MG CAPS, Take 0.4 mg by mouth daily.  , Disp: ,  Rfl: ;  desloratadine (CLARINEX) 5 MG tablet, Take 5 mg by mouth daily.  , Disp: , Rfl:  LORazepam (ATIVAN) 0.5 MG tablet, Take 0.5-1 mg by mouth at bedtime as needed.  , Disp: , Rfl: ;  polyethylene glycol (MIRALAX / GLYCOLAX) packet, Take 17 g by mouth 2 (two) times daily.  , Disp: , Rfl:   Review of Systems Still on chol meds Walks with rolling walker--not that stable Lives with son--he prepares the meals or his wife    Objective:   Physical Exam  Constitutional: He appears well-developed.       Appears frail and chronically ill  Neck: Normal range of motion. Neck supple.  Cardiovascular: Normal rate, regular rhythm and normal heart sounds.  Exam reveals no gallop.   No murmur heard. Pulmonary/Chest: Effort normal and breath sounds normal. No respiratory distress. He has no wheezes. He has no rales.  Abdominal: Soft. Bowel sounds are normal. He exhibits no mass. There is no tenderness.  Musculoskeletal: Normal  range of motion. He exhibits no edema and no tenderness.  Lymphadenopathy:    He has no cervical adenopathy.  Psychiatric: His behavior is normal. Judgment and thought content normal.       Seems depressed Affect is constricted          Assessment & Plan:

## 2011-04-03 LAB — HEMOGLOBIN A1C: Hgb A1c MFr Bld: 8.3 % — ABNORMAL HIGH (ref 4.6–6.5)

## 2011-04-03 LAB — BASIC METABOLIC PANEL
BUN: 13 mg/dL (ref 6–23)
Chloride: 101 mEq/L (ref 96–112)
Glucose, Bld: 163 mg/dL — ABNORMAL HIGH (ref 70–99)
Potassium: 4.4 mEq/L (ref 3.5–5.1)

## 2011-04-03 LAB — CBC WITH DIFFERENTIAL/PLATELET
Basophils Absolute: 0 10*3/uL (ref 0.0–0.1)
Eosinophils Absolute: 0.2 10*3/uL (ref 0.0–0.7)
Eosinophils Relative: 2.8 % (ref 0.0–5.0)
HCT: 34.2 % — ABNORMAL LOW (ref 39.0–52.0)
Lymphs Abs: 2 10*3/uL (ref 0.7–4.0)
MCV: 97.4 fl (ref 78.0–100.0)
Monocytes Absolute: 0.5 10*3/uL (ref 0.1–1.0)
Neutrophils Relative %: 61.6 % (ref 43.0–77.0)
Platelets: 161 10*3/uL (ref 150.0–400.0)
RDW: 14.3 % (ref 11.5–14.6)
WBC: 7.1 10*3/uL (ref 4.5–10.5)

## 2011-04-03 LAB — HEPATIC FUNCTION PANEL
AST: 17 U/L (ref 0–37)
Albumin: 4.4 g/dL (ref 3.5–5.2)
Alkaline Phosphatase: 55 U/L (ref 39–117)
Total Protein: 7.1 g/dL (ref 6.0–8.3)

## 2011-04-03 LAB — LDL CHOLESTEROL, DIRECT: Direct LDL: 136.9 mg/dL

## 2011-04-04 IMAGING — CR DG ABDOMEN 3V
1 series · 4 of 4 positions shown · non-contrast
Comparison: none

REASON FOR EXAM: CONSTIPATION
COMMENTS:

[Series 1: view not recorded · 0.17mm/px · 4 of 4 slices shown]
[im 1/4]
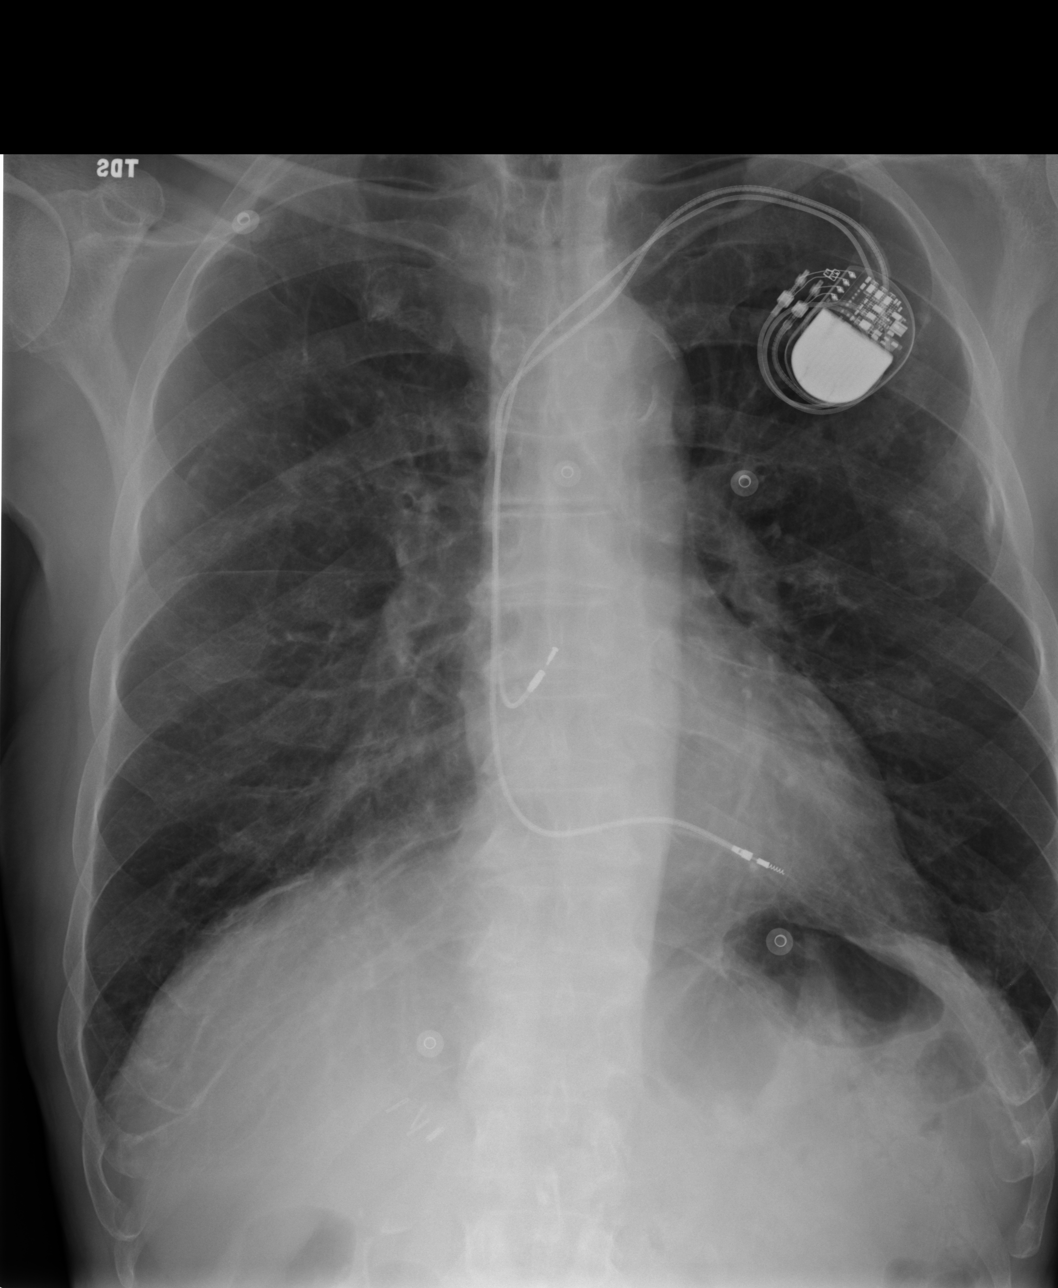
[im 2/4]
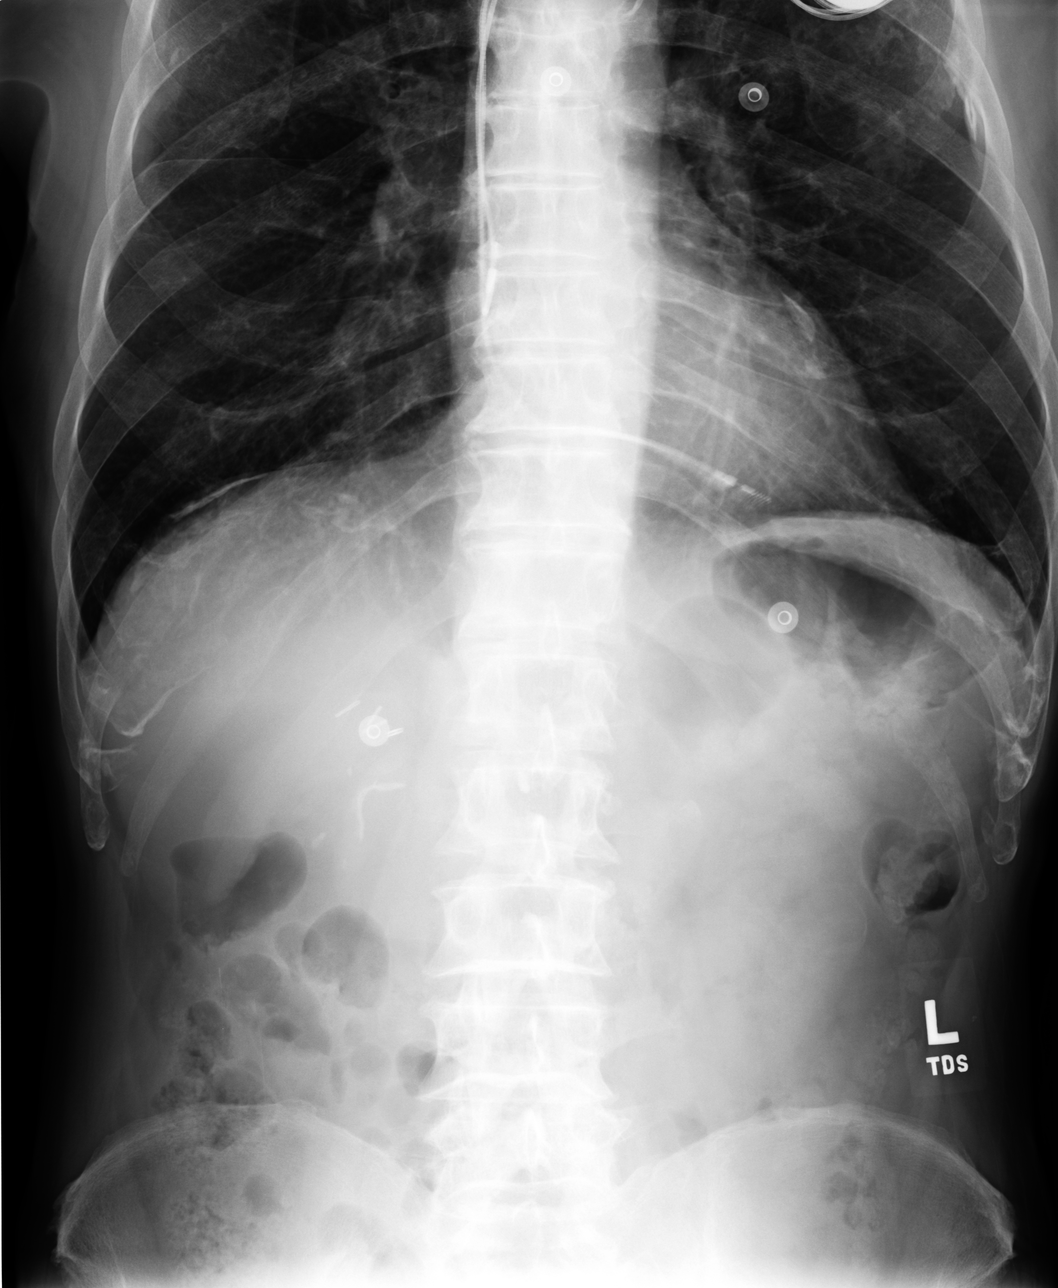
[im 3/4]
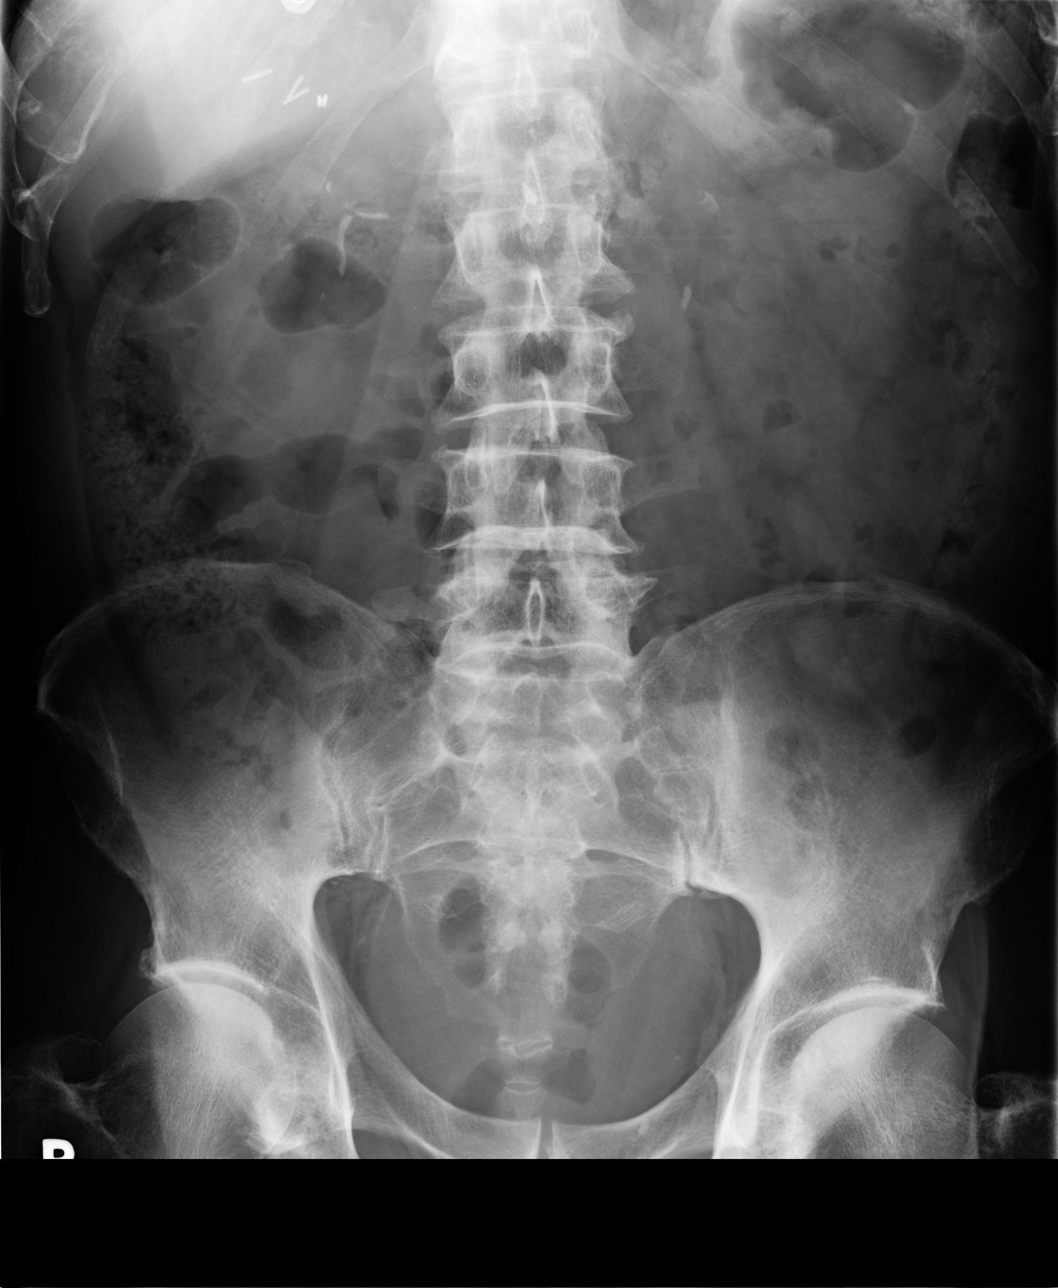
[im 4/4]
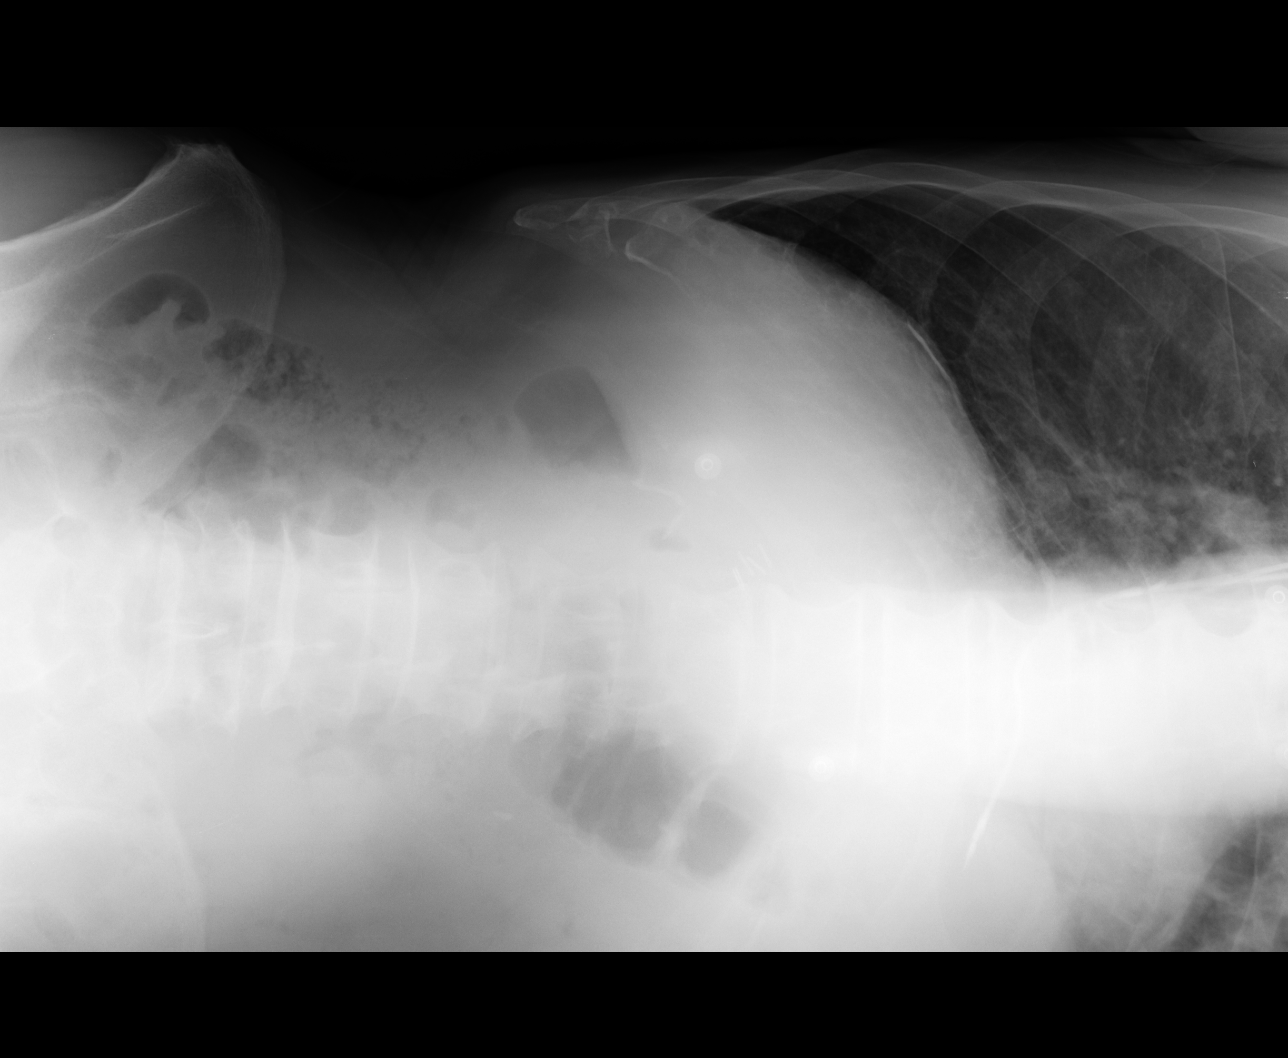

[4 of 4 positions shown; findings below may reference images not displayed]

PROCEDURE:     DXR - DXR ABDOMEN 3-WAY (INCL PA CXR)  - July 29, 2009  [DATE]

RESULT:     There is no previous exam for comparison. A left-sided pacemaker
device is present. The leads appear in appropriate position. The lungs are
fully inflated. There is no infiltrate or edema. Images through the abdomen
show an unremarkable bowel gas pattern. Bony degenerative changes and
cholecystectomy changes are noted.
IMPRESSION: No evidence of bowel obstruction or perforation. No acute
cardiopulmonary disease. Pacemaker present.

## 2011-04-15 ENCOUNTER — Telehealth: Payer: Self-pay | Admitting: *Deleted

## 2011-04-15 ENCOUNTER — Encounter: Payer: Self-pay | Admitting: *Deleted

## 2011-04-15 NOTE — Telephone Encounter (Signed)
Left message on machine to have patient return my call, letter sent to home address with results.

## 2011-04-15 NOTE — Telephone Encounter (Signed)
Message copied by Sueanne Margarita on Wed Apr 15, 2011  3:17 PM ------      Message from: Tillman Abide I      Created: Sun Apr 05, 2011 10:03 AM       Please call      BLood work confirms that sugar control has worsened. Please start glipizide 2.5mg  daily (not XL---1 year Rx) Have him call  if his fasting sugars remain over 200--- I will increase the dose      Chol is up slightly to total of 204 and LDL or bad chol of 136. No changes with this either      Blood count still shows a mild anemia without a striking change      Kidney, thyroid  and liver tests are fine

## 2011-04-21 ENCOUNTER — Other Ambulatory Visit: Payer: Self-pay | Admitting: *Deleted

## 2011-04-21 MED ORDER — GLIPIZIDE 2.5 MG HALF TABLET: 2.5000 mg | ORAL_TABLET | Freq: Every day | ORAL | Status: DC

## 2011-04-23 ENCOUNTER — Telehealth: Payer: Self-pay | Admitting: *Deleted

## 2011-04-23 NOTE — Telephone Encounter (Signed)
Faxed form from [facility] is on your desk, they are asking for clarification on glipizide script.

## 2011-04-27 NOTE — Telephone Encounter (Signed)
rx corrected in chart and resent back to [facility]

## 2011-04-27 NOTE — Telephone Encounter (Signed)
Should be on 2.5mg  daily I was told it didn't come in that size and that is where the confusion came in

## 2011-05-04 ENCOUNTER — Encounter: Payer: Self-pay | Admitting: Internal Medicine

## 2011-05-04 ENCOUNTER — Ambulatory Visit (INDEPENDENT_AMBULATORY_CARE_PROVIDER_SITE_OTHER): Payer: Medicare Other | Admitting: Internal Medicine

## 2011-05-04 VITALS — BP 160/80 | HR 79 | Temp 99.0°F | Ht 67.0 in | Wt 127.0 lb

## 2011-05-04 DIAGNOSIS — N400 Enlarged prostate without lower urinary tract symptoms: Secondary | ICD-10-CM

## 2011-05-04 DIAGNOSIS — R634 Abnormal weight loss: Secondary | ICD-10-CM

## 2011-05-04 DIAGNOSIS — E1129 Type 2 diabetes mellitus with other diabetic kidney complication: Secondary | ICD-10-CM

## 2011-05-04 NOTE — Assessment & Plan Note (Signed)
Control is better Now will be starting the glipizide also

## 2011-05-04 NOTE — Progress Notes (Signed)
Subjective:    Patient ID: Andres [name], male    DOB: [birth year], 77 y.o.   MRN: [id number]  HPI Not doing so well Concerned about awakening soon after going to bed (within 2-3 hours). May get up every 2 hours through the night--can't really empty fully Has trouble getting back to sleep then No trouble voiding during the day--unless he naps  Hasn't started glipizide---confusion about dose Needs to split 5mg  dose Will start now Sugars now down to about 170-180 Not over 200 anymore  Appetite still isn't good Weight down 2# more  Current Outpatient Prescriptions on File Prior to Visit  Medication Sig Dispense Refill  . aspirin 81 MG tablet Take 81 mg by mouth daily.        Marland Kitchen atenolol (TENORMIN) 50 MG tablet Take 50 mg by mouth daily.        . benazepril (LOTENSIN) 20 MG tablet Take 20 mg by mouth daily.        . clopidogrel (PLAVIX) 75 MG tablet Take 75 mg by mouth daily.        Marland Kitchen desloratadine (CLARINEX) 5 MG tablet Take 5 mg by mouth daily.        . finasteride (PROSCAR) 5 MG tablet Take 5 mg by mouth daily.        Marland Kitchen glipiZIDE (GLUCOTROL) 2.5 mg TABS Take 0.5 tablets (2.5 mg total) by mouth daily before breakfast.  30 tablet  11  . isosorbide mononitrate (IMDUR) 30 MG 24 hr tablet Take 30 mg by mouth daily.        Marland Kitchen LIFESCAN FINEPOINT LANCETS MISC USE AS DIRECTED ONCE DAILY  100 each  10  . LORazepam (ATIVAN) 0.5 MG tablet Take 0.5-1 mg by mouth at bedtime as needed.        . metFORMIN (GLUCOPHAGE) 1000 MG tablet Take 1,000 mg by mouth 2 (two) times daily with a meal.        . Multiple Vitamin (MULTIVITAMIN) capsule Take 1 capsule by mouth daily.        Marland Kitchen omeprazole (PRILOSEC) 20 MG capsule Take 20 mg by mouth daily.        . ONE TOUCH ULTRA TEST test strip TEST AS DIRECTED EVERY DAY  100 each  10  . polyethylene glycol (MIRALAX / GLYCOLAX) packet Take 17 g by mouth 2 (two) times daily.        . pravastatin (PRAVACHOL) 20 MG tablet Take 20 mg by mouth daily.        . Tamsulosin  HCl (FLOMAX) 0.4 MG CAPS Take 0.4 mg by mouth daily.          No Known Allergies  Past Medical History  Diagnosis Date  . CAD (coronary artery disease)   . Diverticulitis   . GERD (gastroesophageal reflux disease)   . Hyperlipidemia   . Hypertension   . BPH (benign prostatic hypertrophy)   . Diabetes mellitus   . DJD (degenerative joint disease)     lumbar spine  . IBS (irritable bowel syndrome)   . Stroke     Past Surgical History  Procedure Date  . Angioplasty 1995  . Doppler echocardiography 11/1999    syncope  . Pacemaker insertion     Pacer/cath  (ef 65%)  1/01 -  guidant Discovery II DR pulse generator  . Prostate surgery 07/2003  . Esophagogastroduodenoscopy 07/2005    neg    Family History  Problem Relation Age of Onset  . Diabetes Sister  History   Social History  . Marital Status: Widowed    Spouse Name: N/A    Number of Children: 5  . Years of Education: N/A   Occupational History  . 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 Smoking status: Former Games developer  . Smokeless tobacco: Never Used  . Alcohol Use: No     heavy in the past  . Drug Use: Not on file  . Sexually Active: Not on file   Other Topics Concern  . Not on file   Social History Narrative   Lives with son and DIL   Review of Systems No chest pain No SOB but he does get congestion in his nose at times    Objective:   Physical Exam  Constitutional: He appears well-developed and well-nourished. No distress.  Neck: Normal range of motion. No thyromegaly present.  Cardiovascular: Normal rate, regular rhythm and normal heart sounds.  Exam reveals no gallop.   No murmur heard. Pulmonary/Chest: Effort normal and breath sounds normal. No respiratory distress. He has no wheezes. He has no rales.  Abdominal: Soft. There is no tenderness.       Bladder percussed up to about the umbilicus  Lymphadenopathy:    He has no cervical adenopathy.    Psychiatric: He has a normal mood and affect. His behavior is normal. Judgment and thought content normal.          Assessment & Plan:

## 2011-05-04 NOTE — Assessment & Plan Note (Signed)
Clearly worse despite finasteride and tamsulosin Had falling out in past with Dr Roosevelt Locks seek other urologist Seems to have urinary retention Affecting sleep and could be cause of decreased appetite May need catheter Currently a poor surgical candidate

## 2011-05-04 NOTE — Assessment & Plan Note (Signed)
Continues I think it may be reasonable to try trial of mirtazapine but I would like to see what happens with urology eval first

## 2011-05-04 NOTE — Patient Instructions (Signed)
Please set up the consultation with the urologist

## 2011-05-11 ENCOUNTER — Emergency Department: Payer: Self-pay | Admitting: Emergency Medicine

## 2011-05-12 ENCOUNTER — Other Ambulatory Visit: Payer: Self-pay | Admitting: *Deleted

## 2011-05-12 MED ORDER — POLYETHYLENE GLYCOL 3350 17 G PO PACK: 17.0000 g | PACK | Freq: Two times a day (BID) | ORAL | Status: DC

## 2011-05-18 ENCOUNTER — Other Ambulatory Visit: Payer: Self-pay | Admitting: *Deleted

## 2011-05-18 MED ORDER — TAMSULOSIN HCL 0.4 MG PO CAPS: 0.4000 mg | ORAL_CAPSULE | Freq: Every day | ORAL | Status: DC

## 2011-05-22 NOTE — Telephone Encounter (Signed)
Script changed to canisters, packets not covered by insurance.

## 2011-06-25 ENCOUNTER — Encounter: Payer: Self-pay | Admitting: Internal Medicine

## 2011-06-25 ENCOUNTER — Ambulatory Visit (INDEPENDENT_AMBULATORY_CARE_PROVIDER_SITE_OTHER): Payer: Medicare Other | Admitting: Internal Medicine

## 2011-06-25 VITALS — BP 120/60 | HR 70 | Temp 98.0°F | Ht 67.0 in | Wt 127.0 lb

## 2011-06-25 DIAGNOSIS — F32A Depression, unspecified: Secondary | ICD-10-CM

## 2011-06-25 DIAGNOSIS — F329 Major depressive disorder, single episode, unspecified: Secondary | ICD-10-CM

## 2011-06-25 DIAGNOSIS — F39 Unspecified mood [affective] disorder: Secondary | ICD-10-CM | POA: Insufficient documentation

## 2011-06-25 DIAGNOSIS — E1129 Type 2 diabetes mellitus with other diabetic kidney complication: Secondary | ICD-10-CM

## 2011-06-25 DIAGNOSIS — N400 Enlarged prostate without lower urinary tract symptoms: Secondary | ICD-10-CM

## 2011-06-25 DIAGNOSIS — R634 Abnormal weight loss: Secondary | ICD-10-CM

## 2011-06-25 MED ORDER — MIRTAZAPINE 15 MG PO TABS: 15.0000 mg | ORAL_TABLET | Freq: Every day | ORAL | Status: AC

## 2011-06-25 NOTE — Assessment & Plan Note (Signed)
Control better since starting the glipizide Labs next time

## 2011-06-25 NOTE — Patient Instructions (Signed)
Please start the new medication, mirtazapine, at bedtime to help your appetite and lift your depression  Call if you have any problems with it

## 2011-06-25 NOTE — Assessment & Plan Note (Signed)
Seems to fit criteria for major depression now

## 2011-06-25 NOTE — Assessment & Plan Note (Signed)
Has stabilized since last visit but not eating well Seems to have depression as well Will go ahead and try the mirtazapine

## 2011-06-25 NOTE — Progress Notes (Signed)
Subjective:    Patient ID: Andres [name], male    DOB: [birth year], 77 y.o.   MRN: [id number]  HPI Doing better since starting the glipizide Fasting sugars had been fairly high--now down below 120 recently  Cancelled with the urologist Found that he was voiding better when he got cleared out better Notes some urinary frequency No leakage now Does occ some suprapubic pain in evening--"not sure if it is my prostate or my bowels"  Appetite is still not that good Weight has stabilized  Has some depression Definite anhedonia  Current Outpatient Prescriptions on File Prior to Visit  Medication Sig Dispense Refill  . aspirin 81 MG tablet Take 81 mg by mouth daily.        Marland Kitchen atenolol (TENORMIN) 50 MG tablet Take 50 mg by mouth daily.        . benazepril (LOTENSIN) 20 MG tablet Take 20 mg by mouth daily.        . clopidogrel (PLAVIX) 75 MG tablet Take 75 mg by mouth daily.        Marland Kitchen desloratadine (CLARINEX) 5 MG tablet Take 5 mg by mouth daily.        . finasteride (PROSCAR) 5 MG tablet Take 5 mg by mouth daily.        Marland Kitchen glipiZIDE (GLUCOTROL) 2.5 mg TABS Take 0.5 tablets (2.5 mg total) by mouth daily before breakfast.  30 tablet  11  . isosorbide mononitrate (IMDUR) 30 MG 24 hr tablet Take 30 mg by mouth daily.        Marland Kitchen LIFESCAN FINEPOINT LANCETS MISC USE AS DIRECTED ONCE DAILY  100 each  10  . LORazepam (ATIVAN) 0.5 MG tablet Take 0.5-1 mg by mouth at bedtime as needed.        . metFORMIN (GLUCOPHAGE) 1000 MG tablet Take 1,000 mg by mouth 2 (two) times daily with a meal.        . Multiple Vitamin (MULTIVITAMIN) capsule Take 1 capsule by mouth daily.        Marland Kitchen omeprazole (PRILOSEC) 20 MG capsule Take 20 mg by mouth daily.        . ONE TOUCH ULTRA TEST test strip TEST AS DIRECTED EVERY DAY  100 each  10  . polyethylene glycol (MIRALAX / GLYCOLAX) packet Take 17 g by mouth 2 (two) times daily.  180 packet  3  . pravastatin (PRAVACHOL) 20 MG tablet Take 20 mg by mouth daily.        .  Tamsulosin HCl (FLOMAX) 0.4 MG CAPS Take 1 capsule (0.4 mg total) by mouth daily.  90 capsule  3    No Known Allergies  Past Medical History  Diagnosis Date  . CAD (coronary artery disease)   . Diverticulitis   . GERD (gastroesophageal reflux disease)   . Hyperlipidemia   . Hypertension   . BPH (benign prostatic hypertrophy)   . Diabetes mellitus   . DJD (degenerative joint disease)     lumbar spine  . IBS (irritable bowel syndrome)   . Stroke     Past Surgical History  Procedure Date  . Angioplasty 1995  . Doppler echocardiography 11/1999    syncope  . Pacemaker insertion     Pacer/cath  (ef 65%)  1/01 -  guidant Discovery II DR pulse generator  . Prostate surgery 07/2003  . Esophagogastroduodenoscopy 07/2005    neg    Family History  Problem Relation Age of Onset  . Diabetes Sister     History  Social History  . Marital Status: Widowed    Spouse Name: N/A    Number of Children: 5  . Years of Education: N/A   Occupational History  . 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 Smoking status: Former Games developer  . Smokeless tobacco: Never Used  . Alcohol Use: No     heavy in the past  . Drug Use: Not on file  . Sexually Active: Not on file   Other Topics Concern  . Not on file   Social History Narrative   Lives with son and DIL   Review of Systems No chest pain No SOB Still gets sense of sores in nose---saw ENT and he got spray for this      Objective:   Physical Exam  Constitutional: No distress.       More alert   Neck: No thyromegaly present.  Cardiovascular: Normal rate, regular rhythm and normal heart sounds.  Exam reveals no gallop.   No murmur heard. Pulmonary/Chest: Effort normal and breath sounds normal. No respiratory distress. He has no wheezes. He has no rales.  Abdominal: Soft. There is no tenderness.       No sig suprapubic dullness now  Musculoskeletal: He exhibits no edema.  Lymphadenopathy:     He has no cervical adenopathy.  Psychiatric:       Seems depressed Affect is constricted Normal interaction          Assessment & Plan:

## 2011-06-25 NOTE — Assessment & Plan Note (Signed)
Bladder seems to be emptying better Likely related to better control of constipation

## 2011-07-27 ENCOUNTER — Ambulatory Visit (INDEPENDENT_AMBULATORY_CARE_PROVIDER_SITE_OTHER): Payer: Medicare Other | Admitting: Internal Medicine

## 2011-07-27 ENCOUNTER — Encounter: Payer: Self-pay | Admitting: Internal Medicine

## 2011-07-27 DIAGNOSIS — E1129 Type 2 diabetes mellitus with other diabetic kidney complication: Secondary | ICD-10-CM

## 2011-07-27 DIAGNOSIS — K5289 Other specified noninfective gastroenteritis and colitis: Secondary | ICD-10-CM

## 2011-07-27 DIAGNOSIS — F329 Major depressive disorder, single episode, unspecified: Secondary | ICD-10-CM

## 2011-07-27 DIAGNOSIS — K529 Noninfective gastroenteritis and colitis, unspecified: Secondary | ICD-10-CM

## 2011-07-27 DIAGNOSIS — R634 Abnormal weight loss: Secondary | ICD-10-CM

## 2011-07-27 MED ORDER — PRAVASTATIN SODIUM 20 MG PO TABS: 20.0000 mg | ORAL_TABLET | Freq: Every day | ORAL | Status: DC

## 2011-07-27 NOTE — Progress Notes (Signed)
Subjective:    Patient ID: Andres [name], male    DOB: Jan 05, 1926, 77 y.o.   MRN: [id number]  HPI 4 days ago, he started with diarrhea Lots for a few days---actually got soreness on bottom Stomach pain---"spasms" No vomiting Appetite way down so not eating much---starting again in past 2 days  Tried the mirtazapine for about 1 week Found it "hard to move around and felt funny" He stopped it  Feels his mood is "about the same as always" Doesn't enjoy computers as much but still spends a lot of time on it Misses his bowling Lives with son and DIL  Thinks about dying but not preoccupied No suicidal thoughts  Current Outpatient Prescriptions on File Prior to Visit  Medication Sig Dispense Refill  . aspirin 81 MG tablet Take 81 mg by mouth daily.        Marland Kitchen atenolol (TENORMIN) 50 MG tablet Take 50 mg by mouth daily.        . benazepril (LOTENSIN) 20 MG tablet Take 20 mg by mouth daily.        . clopidogrel (PLAVIX) 75 MG tablet Take 75 mg by mouth daily.        Marland Kitchen desloratadine (CLARINEX) 5 MG tablet Take 5 mg by mouth daily.        . finasteride (PROSCAR) 5 MG tablet Take 5 mg by mouth daily.        Marland Kitchen glipiZIDE (GLUCOTROL) 2.5 mg TABS Take 0.5 tablets (2.5 mg total) by mouth daily before breakfast.  30 tablet  11  . isosorbide mononitrate (IMDUR) 30 MG 24 hr tablet Take 30 mg by mouth daily.        Marland Kitchen LIFESCAN FINEPOINT LANCETS MISC USE AS DIRECTED ONCE DAILY  100 each  10  . LORazepam (ATIVAN) 0.5 MG tablet Take 0.5-1 mg by mouth at bedtime as needed.        . metFORMIN (GLUCOPHAGE) 1000 MG tablet Take 1,000 mg by mouth 2 (two) times daily with a meal.        . Multiple Vitamin (MULTIVITAMIN) capsule Take 1 capsule by mouth daily.        Marland Kitchen omeprazole (PRILOSEC) 20 MG capsule Take 20 mg by mouth daily.        . ONE TOUCH ULTRA TEST test strip TEST AS DIRECTED EVERY DAY  100 each  10  . polyethylene glycol (MIRALAX / GLYCOLAX) packet Take 17 g by mouth 2 (two) times daily.  180 packet   3  . pravastatin (PRAVACHOL) 20 MG tablet Take 20 mg by mouth daily.        . Tamsulosin HCl (FLOMAX) 0.4 MG CAPS Take 1 capsule (0.4 mg total) by mouth daily.  90 capsule  3    No Known Allergies  Past Medical History  Diagnosis Date  . CAD (coronary artery disease)   . Diverticulitis   . GERD (gastroesophageal reflux disease)   . Hyperlipidemia   . Hypertension   . BPH (benign prostatic hypertrophy)   . Diabetes mellitus   . DJD (degenerative joint disease)     lumbar spine  . IBS (irritable bowel syndrome)   . Stroke   . Depression 2012    Past Surgical History  Procedure Date  . Angioplasty 1995  . Doppler echocardiography 11/1999    syncope  . Pacemaker insertion     Pacer/cath  (ef 65%)  1/01 -  guidant Discovery II DR pulse generator  . Prostate surgery 07/2003  .  Esophagogastroduodenoscopy 07/2005    neg    Family History  Problem Relation Age of Onset  . Diabetes Sister     History   Social History  . Marital Status: Widowed    Spouse Name: N/A    Number of Children: 5  . Years of Education: N/A   Occupational History  . 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 Smoking status: Former Games developer  . Smokeless tobacco: Never Used  . Alcohol Use: No     heavy in the past  . Drug Use: Not on file  . Sexually Active: Not on file   Other Topics Concern  . Not on file   Social History Narrative   Lives with son and DIL    Review of Systems Gets pain in tips of mid left 3 fingertips (median nerve distribution?) Now carries cell phone to call son in case he falls Stomach pain at night affects his sleep     Objective:   Physical Exam  Constitutional: He appears well-developed and well-nourished. No distress.  Neck: Normal range of motion. Neck supple.  Cardiovascular: Normal rate, regular rhythm and normal heart sounds.  Exam reveals no gallop.   No murmur heard. Pulmonary/Chest: Effort normal and breath  sounds normal. No respiratory distress. He has no wheezes. He has no rales.  Abdominal: Soft. Bowel sounds are normal. He exhibits no distension. There is no rebound.       Very slight generalized tenderness  Lymphadenopathy:    He has no cervical adenopathy.          Assessment & Plan:

## 2011-07-27 NOTE — Assessment & Plan Note (Signed)
Lost another 2 # but just finished viral illness reeval again in a month

## 2011-07-27 NOTE — Assessment & Plan Note (Signed)
Seems like self limited viral illness Sensitive bowel with chronic symptoms Seems back to baseline

## 2011-07-27 NOTE — Assessment & Plan Note (Signed)
May be more like dysthymia Not clear meds needed now Didn't do well with mirtazapine Will hold off and recheck in a month with son

## 2011-07-27 NOTE — Assessment & Plan Note (Signed)
Lab Results  Component Value Date   HGBA1C 8.3* 04/02/2011   Will recheck A1c He feels his sugar control is much better since recent changes

## 2011-07-30 ENCOUNTER — Telehealth: Payer: Self-pay | Admitting: *Deleted

## 2011-07-30 NOTE — Telephone Encounter (Signed)
Patient notified as instructed by telephone. 

## 2011-07-30 NOTE — Telephone Encounter (Signed)
Message copied by Sueanne Margarita on Thu Jul 30, 2011 11:32 AM ------      Message from: Tillman Abide I      Created: Wed Jul 29, 2011  8:06 AM       Please call      The diabetes control is much better now      A1c all the way down to 6.4%      No changes needed

## 2011-07-30 NOTE — Telephone Encounter (Signed)
.  left message to have patient return my call.  

## 2011-08-27 ENCOUNTER — Ambulatory Visit: Payer: Medicare Other | Admitting: Internal Medicine

## 2011-08-31 ENCOUNTER — Ambulatory Visit: Payer: Medicare Other | Admitting: Internal Medicine

## 2011-09-08 ENCOUNTER — Telehealth: Payer: Self-pay | Admitting: Internal Medicine

## 2011-09-08 NOTE — Telephone Encounter (Signed)
Pt called request Medication refill to [facility]  for Atenolol 50mg .Marland KitchenMarland Kitchen

## 2011-09-08 NOTE — Telephone Encounter (Signed)
Pt called request refill for

## 2011-09-09 MED ORDER — ATENOLOL 50 MG PO TABS: 50.0000 mg | ORAL_TABLET | Freq: Every day | ORAL | Status: DC

## 2011-09-15 ENCOUNTER — Other Ambulatory Visit: Payer: Self-pay | Admitting: *Deleted

## 2011-09-15 MED ORDER — ATENOLOL 50 MG PO TABS: 50.0000 mg | ORAL_TABLET | Freq: Every day | ORAL | Status: DC

## 2011-09-15 MED ORDER — ISOSORBIDE MONONITRATE ER 30 MG PO TB24: 30.0000 mg | ORAL_TABLET | Freq: Every day | ORAL | Status: DC

## 2011-09-15 MED ORDER — BENAZEPRIL HCL 20 MG PO TABS: 20.0000 mg | ORAL_TABLET | Freq: Every day | ORAL | Status: DC

## 2011-10-09 ENCOUNTER — Other Ambulatory Visit: Payer: Self-pay | Admitting: *Deleted

## 2011-10-09 NOTE — Telephone Encounter (Signed)
Called patient at home number, got no answer or machine.

## 2011-10-09 NOTE — Telephone Encounter (Signed)
Patient called back and mail order pharmacy changed.  The prescription needs to be sent to [facility] at [phone]

## 2011-10-14 NOTE — Telephone Encounter (Signed)
.  left message to have patient return my call, to let us know what medication he needs refilled.

## 2011-10-15 ENCOUNTER — Telehealth: Payer: Self-pay | Admitting: Internal Medicine

## 2011-10-15 NOTE — Telephone Encounter (Signed)
Discussed with son Will proceed with hospice consult---this was phoned in by me

## 2011-10-15 NOTE — Telephone Encounter (Signed)
Patient's son called and stated that he is weak fallen twice and will not eat, he seems to be deteriorating.  He only gets out of bed when he has to.  I tried to make an appointment for him but his son said he can't get him to the car because he is too weak.  Please advise.

## 2011-10-16 ENCOUNTER — Telehealth: Payer: Self-pay | Admitting: Internal Medicine

## 2011-10-16 NOTE — Telephone Encounter (Signed)
Message left abd pain has been chronic---and often seems to be related to constipation Multiple GI evaluations i am hesitant to use narcotics but we may need to consider over time  Home visit within 2 weeks

## 2011-10-16 NOTE — Telephone Encounter (Signed)
Teressa from hospice went out today to evaluate him and stated he complained of stomach pain and wanted to know if is something that you want to call in to help with the pain.  Runell Gess:  [phone]

## 2011-10-21 ENCOUNTER — Ambulatory Visit: Payer: Medicare Other | Admitting: Internal Medicine

## 2011-10-21 ENCOUNTER — Encounter: Payer: Self-pay | Admitting: Internal Medicine

## 2011-10-21 VITALS — BP 102/64 | HR 60 | Resp 16

## 2011-10-21 DIAGNOSIS — L570 Actinic keratosis: Secondary | ICD-10-CM

## 2011-10-21 DIAGNOSIS — N400 Enlarged prostate without lower urinary tract symptoms: Secondary | ICD-10-CM

## 2011-10-21 DIAGNOSIS — I10 Essential (primary) hypertension: Secondary | ICD-10-CM

## 2011-10-21 DIAGNOSIS — K219 Gastro-esophageal reflux disease without esophagitis: Secondary | ICD-10-CM

## 2011-10-21 DIAGNOSIS — E1129 Type 2 diabetes mellitus with other diabetic kidney complication: Secondary | ICD-10-CM

## 2011-10-21 DIAGNOSIS — R1030 Lower abdominal pain, unspecified: Secondary | ICD-10-CM | POA: Insufficient documentation

## 2011-10-21 DIAGNOSIS — I2510 Atherosclerotic heart disease of native coronary artery without angina pectoris: Secondary | ICD-10-CM

## 2011-10-21 DIAGNOSIS — R634 Abnormal weight loss: Secondary | ICD-10-CM

## 2011-10-21 DIAGNOSIS — R109 Unspecified abdominal pain: Secondary | ICD-10-CM

## 2011-10-21 MED ORDER — FINASTERIDE 5 MG PO TABS: 5.0000 mg | ORAL_TABLET | Freq: Every day | ORAL | Status: DC

## 2011-10-21 NOTE — Assessment & Plan Note (Signed)
Still not eating well Failed mirtazapine No meds for now Consider SSRI if depression remains an issue

## 2011-10-21 NOTE — Assessment & Plan Note (Signed)
Still on the omeprazole

## 2011-10-21 NOTE — Assessment & Plan Note (Signed)
BP Readings from Last 3 Encounters:  10/21/11 102/64  07/27/11 149/56  06/25/11 120/60   Lower today Continue meds but lower atenolol dose

## 2011-10-21 NOTE — Assessment & Plan Note (Signed)
has been off the finasteride May be having some urinary retention Will restart that and continue the tamsulosin

## 2011-10-21 NOTE — Assessment & Plan Note (Signed)
Lab Results  Component Value Date   HGBA1C 6.4 07/27/2011   Due to abd issues, will try decreasing the metformin

## 2011-10-21 NOTE — Assessment & Plan Note (Signed)
Rx with liquid nitrogen 45 seconds x 2 to nasal lesion Discussed care

## 2011-10-21 NOTE — Assessment & Plan Note (Signed)
Has been a long standing problem Seemed to be related to bowels, but not as clear now Will stop statin and decrease metformin just in case

## 2011-10-21 NOTE — Progress Notes (Signed)
Subjective:    Patient ID: Andres [name], male    DOB: Jan 21, 1926, 77 y.o.   MRN: [id number]  HPI Seen at home for 1st time Had been failing so hospice brought in Son is here---he lives with him and DIL Runell Gess the hospice nurse is here also  No recent weight ---Runell Gess will bring a scale so we can monitor his weight  Patient still has ongoing severe abdominal pain at times--mostly at night Feels like he has to void or defecate---but then can't  Appetite remains spotty Good breakfast but not great the rest of the day (much of the time)  Has constipation still Also trouble passing urine---apparently hasn't been taking his finasteride  No chest pain NO SOB No edema  Not happy "because of my condition" Does have anhedonia  Checking sugars sporadically AM usually under 130  Current Outpatient Prescriptions on File Prior to Visit  Medication Sig Dispense Refill  . aspirin 81 MG tablet Take 81 mg by mouth daily.        . benazepril (LOTENSIN) 20 MG tablet Take 1 tablet (20 mg total) by mouth daily.  90 tablet  3  . clopidogrel (PLAVIX) 75 MG tablet Take 75 mg by mouth daily.        Marland Kitchen glipiZIDE (GLUCOTROL) 2.5 mg TABS Take 0.5 tablets (2.5 mg total) by mouth daily before breakfast.  30 tablet  11  . isosorbide mononitrate (IMDUR) 30 MG 24 hr tablet Take 1 tablet (30 mg total) by mouth daily.  90 tablet  3  . LIFESCAN FINEPOINT LANCETS MISC USE AS DIRECTED ONCE DAILY  100 each  10  . LORazepam (ATIVAN) 0.5 MG tablet Take 0.5-1 mg by mouth at bedtime as needed.        . metFORMIN (GLUCOPHAGE) 1000 MG tablet Take 500 mg by mouth 2 (two) times daily with a meal.       . Multiple Vitamin (MULTIVITAMIN) capsule Take 1 capsule by mouth daily.        Marland Kitchen omeprazole (PRILOSEC) 20 MG capsule Take 20 mg by mouth daily.        . ONE TOUCH ULTRA TEST test strip TEST AS DIRECTED EVERY DAY  100 each  10  . polyethylene glycol (MIRALAX / GLYCOLAX) packet Take 17 g by mouth 2 (two) times daily.   180 packet  3  . Tamsulosin HCl (FLOMAX) 0.4 MG CAPS Take 1 capsule (0.4 mg total) by mouth daily.  90 capsule  3    No Known Allergies  Past Medical History  Diagnosis Date  . CAD (coronary artery disease)   . Diverticulitis   . GERD (gastroesophageal reflux disease)   . Hyperlipidemia   . Hypertension   . BPH (benign prostatic hypertrophy)   . Diabetes mellitus   . DJD (degenerative joint disease)     lumbar spine  . IBS (irritable bowel syndrome)   . Stroke   . Depression 2012    Past Surgical History  Procedure Date  . Angioplasty 1995  . Doppler echocardiography 11/1999    syncope  . Pacemaker insertion     Pacer/cath  (ef 65%)  1/01 -  guidant Discovery II DR pulse generator  . Prostate surgery 07/2003  . Esophagogastroduodenoscopy 07/2005    neg    Family History  Problem Relation Age of Onset  . Diabetes Sister     History   Social History  . Marital Status: Widowed    Spouse Name: N/A    Number  of Children: 5  . Years of Education: N/A   Occupational History  . 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 Smoking status: Former Games developer  . Smokeless tobacco: Never Used  . Alcohol Use: No     heavy in the past  . Drug Use: Not on file  . Sexually Active: Not on file   Other Topics Concern  . Not on file   Social History Narrative   Lives with son and DIL        Review of Systems Irritated area on side of nose Sleeps okay but not for a long time Has gotten back to playing games on his computer    Objective:   Physical Exam  Constitutional: No distress.       Slim but looks his usual  Neck: Normal range of motion. Neck supple.  Cardiovascular: Normal rate, regular rhythm and normal heart sounds.  Exam reveals no gallop.   No murmur heard. Pulmonary/Chest: Effort normal and breath sounds normal. No respiratory distress. He has no wheezes. He has no rales.  Abdominal: Soft. There is no tenderness.        May have some suprapubic dullness  Lymphadenopathy:    He has no cervical adenopathy.  Neurological:       Walks slowly but stable without assistance  Skin: No rash noted.       Actinic keratosis on left side of nose  Psychiatric:       Dysthymic Not anxious          Assessment & Plan:

## 2011-10-21 NOTE — Assessment & Plan Note (Signed)
Doesn't seem active Will stop the statin since it may be causing changes in appetite or abd pain Decrease atenolol due to low blood presure

## 2011-10-28 DIAGNOSIS — R627 Adult failure to thrive: Secondary | ICD-10-CM

## 2011-11-06 ENCOUNTER — Encounter: Payer: Medicare Other | Admitting: Internal Medicine

## 2011-11-11 ENCOUNTER — Telehealth: Payer: Self-pay | Admitting: Internal Medicine

## 2011-11-11 NOTE — Telephone Encounter (Signed)
Sherrilyn Rist physical therapist with [facility] called and stated patient had a fall and the back of his left leg was hurting a little along the hamstring and back of knee.  She said no bruising or swelling and was able to move knee within normal limits but wanted to let you know.

## 2011-11-12 ENCOUNTER — Telehealth: Payer: Self-pay | Admitting: Internal Medicine

## 2011-11-12 NOTE — Telephone Encounter (Signed)
.  left message to have patient return my call.  

## 2011-11-12 NOTE — Telephone Encounter (Signed)
Patient his experiencing pain in his back of his left knee.  He is taking Tylenol and using a heating pad and rubbing Aspercreme and Flexall.  Just wanted to know if there is anything else he could take for pain but nothing narotic.  No brusing or swelling. Able to move leg within normal limits.

## 2011-11-12 NOTE — Telephone Encounter (Signed)
noted 

## 2011-11-12 NOTE — Telephone Encounter (Signed)
I would not want him to take ibuprofen based on his med list (I have never seen pt but takes meds that can inc bleeding). We could try rx for tramadol if he is interested.  Related to narcotics but does not cause as much constipation or sedation.

## 2011-11-16 ENCOUNTER — Telehealth: Payer: Self-pay | Admitting: *Deleted

## 2011-11-16 NOTE — Telephone Encounter (Signed)
Terri called stating that she had faxed over a Plan of Care for patient on 10/16/11 and does not show that she received it back. The Plan of Care [facility] Certification and Plan of Treatment) is due today which is the last day within the 30 day window and is for 10/16/11 to 01/13/12. If you have a record of it please fax it to her and if not let her know and she will fax it over to you again.

## 2011-11-16 NOTE — Telephone Encounter (Signed)
I was out last 3 days I did see it today and signed it It should have been sent out

## 2011-11-16 NOTE — Telephone Encounter (Signed)
Is patient in hospice? Please advise on message, I can't reach him on the phone.

## 2011-11-16 NOTE — Telephone Encounter (Signed)
Is on hospice Can try reaching his nurse

## 2011-11-17 NOTE — Telephone Encounter (Signed)
I spoke with Terri and I sent her the Plan of Treatment done on 10/28/11.

## 2011-11-17 NOTE — Telephone Encounter (Signed)
Can't find number for a hospice nurse, will wait for hospice call. I also left message on son's voicemail with results, advised him to call if a rx is needed.

## 2011-12-15 ENCOUNTER — Other Ambulatory Visit: Payer: Self-pay | Admitting: *Deleted

## 2011-12-15 NOTE — Telephone Encounter (Signed)
Received hand written note from regina asking for med refills, asking that they be sent to [facility].

## 2011-12-15 NOTE — Telephone Encounter (Signed)
Left message at home number asking pt to return my call about refills.

## 2011-12-22 NOTE — Telephone Encounter (Signed)
Patient or son not returning my calls, will wait for patient to call.

## 2011-12-28 ENCOUNTER — Telehealth: Payer: Self-pay | Admitting: Internal Medicine

## 2011-12-28 MED ORDER — METFORMIN HCL 1000 MG PO TABS: 500.0000 mg | ORAL_TABLET | Freq: Two times a day (BID) | ORAL | Status: DC

## 2011-12-28 MED ORDER — ISOSORBIDE MONONITRATE ER 30 MG PO TB24: 30.0000 mg | ORAL_TABLET | Freq: Every day | ORAL | Status: DC

## 2011-12-28 MED ORDER — TAMSULOSIN HCL 0.4 MG PO CAPS: 0.4000 mg | ORAL_CAPSULE | Freq: Every day | ORAL | Status: DC

## 2011-12-28 MED ORDER — OMEPRAZOLE 20 MG PO CPDR: 20.0000 mg | DELAYED_RELEASE_CAPSULE | Freq: Every day | ORAL | Status: DC

## 2011-12-28 MED ORDER — FINASTERIDE 5 MG PO TABS: 5.0000 mg | ORAL_TABLET | Freq: Every day | ORAL | Status: DC

## 2011-12-28 MED ORDER — CLOPIDOGREL BISULFATE 75 MG PO TABS: 75.0000 mg | ORAL_TABLET | Freq: Every day | ORAL | Status: DC

## 2011-12-28 MED ORDER — GLIPIZIDE 2.5 MG HALF TABLET: 2.5000 mg | ORAL_TABLET | Freq: Every day | ORAL | Status: DC

## 2011-12-28 MED ORDER — POLYETHYLENE GLYCOL 3350 17 G PO PACK: 17.0000 g | PACK | Freq: Two times a day (BID) | ORAL | Status: DC

## 2011-12-28 MED ORDER — ATENOLOL 50 MG PO TABS: 25.0000 mg | ORAL_TABLET | Freq: Every day | ORAL | Status: DC

## 2011-12-28 MED ORDER — BENAZEPRIL HCL 20 MG PO TABS: 20.0000 mg | ORAL_TABLET | Freq: Every day | ORAL | Status: DC

## 2011-12-28 NOTE — Telephone Encounter (Signed)
Hospice nurse is calling for pt and he needs some refills. Pt is wanting a years worth of refills. He needs Clopidogrel 75 mg, Omeprazole 20 mg, Finasteride 5 mg, Metformin 1000 mg, Tamsulosin 0.4 mg, Glipizide 5 mg, Benazepril 20 mg. He is stating that eventually he would like all of his meds refilled for one year. He gets a 3 months supply and he gets a discount that way.

## 2011-12-28 NOTE — Telephone Encounter (Signed)
rx sent to pharmacy by e-script, [facility] Spoke with patient and advised results

## 2012-01-04 ENCOUNTER — Other Ambulatory Visit: Payer: Self-pay | Admitting: *Deleted

## 2012-01-04 MED ORDER — GLIPIZIDE ER 2.5 MG PO TB24: 2.5000 mg | ORAL_TABLET | Freq: Every day | ORAL | Status: DC

## 2012-01-04 NOTE — Telephone Encounter (Signed)
Received fax from [facility] requesting clarification on medications that were sent in on 12/28/2011.  Form in your IN box.

## 2012-01-04 NOTE — Telephone Encounter (Signed)
See clarifications Change to glipizide XL

## 2012-01-06 ENCOUNTER — Telehealth: Payer: Self-pay | Admitting: *Deleted

## 2012-01-06 NOTE — Telephone Encounter (Signed)
Clarification is needed for metformin script, fax is on your desk.

## 2012-01-07 MED ORDER — METFORMIN HCL 1000 MG PO TABS: 500.0000 mg | ORAL_TABLET | Freq: Two times a day (BID) | ORAL | Status: DC

## 2012-01-07 MED ORDER — ATENOLOL 50 MG PO TABS: 25.0000 mg | ORAL_TABLET | Freq: Every day | ORAL | Status: DC

## 2012-01-07 NOTE — Telephone Encounter (Signed)
Quantities on medications where wrong, I have corrected and sent back to [facility].

## 2012-01-13 ENCOUNTER — Encounter: Payer: Self-pay | Admitting: Internal Medicine

## 2012-01-13 ENCOUNTER — Ambulatory Visit: Payer: Medicare Other | Admitting: Internal Medicine

## 2012-01-13 VITALS — BP 100/52 | HR 62 | Resp 18 | Wt 122.0 lb

## 2012-01-13 DIAGNOSIS — R109 Unspecified abdominal pain: Secondary | ICD-10-CM

## 2012-01-13 DIAGNOSIS — N058 Unspecified nephritic syndrome with other morphologic changes: Secondary | ICD-10-CM

## 2012-01-13 DIAGNOSIS — R1030 Lower abdominal pain, unspecified: Secondary | ICD-10-CM

## 2012-01-13 DIAGNOSIS — E1129 Type 2 diabetes mellitus with other diabetic kidney complication: Secondary | ICD-10-CM

## 2012-01-13 DIAGNOSIS — I2510 Atherosclerotic heart disease of native coronary artery without angina pectoris: Secondary | ICD-10-CM

## 2012-01-13 DIAGNOSIS — R5381 Other malaise: Secondary | ICD-10-CM

## 2012-01-13 DIAGNOSIS — F329 Major depressive disorder, single episode, unspecified: Secondary | ICD-10-CM

## 2012-01-13 DIAGNOSIS — N400 Enlarged prostate without lower urinary tract symptoms: Secondary | ICD-10-CM

## 2012-01-13 NOTE — Assessment & Plan Note (Addendum)
Continues May be related to meds as BP down now Will try off the atenolol  Due to generalized weakness and effects of past stroke, her requires a bedside cammode. This reduces the chance for fall at night and he uses it in day as well since he is unable to independently push himself up off cammode without the available arms on bedside cammode

## 2012-01-13 NOTE — Assessment & Plan Note (Signed)
Mostly seems reactive to his poor status No meds for now He is looking forward to spring---maybe that will perk him up

## 2012-01-13 NOTE — Assessment & Plan Note (Signed)
Seems to be somewhat better with more regular bowels on the senna Could still add back the miralax if needed Still has symptoms related to urinary urgency

## 2012-01-13 NOTE — Progress Notes (Signed)
Subjective:    Patient ID: Andres [name], male    DOB: Jan 09, 1926, 78 y.o.   MRN: [id number]  HPI Son is here [place] hospice RN unable to make it here  Not doing well Very weak still  Not eating well Not much appetite after breakfast Pain is better but still gets urgency to void and then can only go a small amount That does relieve the discomfort  Did have sense of seeing face this morning --though it was one of the hospice aides. But noone was there No other hallucinations Did awaken to this--discussed that it could be he was just coming out of dream  Bowels are still slow Not using miralax Does use the senna 1 in AM, 2 at bedtime and fairly satisfied  No chest pain Breathing is fine No edema  Mood is still not great---"because I just can't improve" Looking forward to spring and hopes to get out more  Checks sugars occ Always have been fine No hypoglycemic reactions  Current Outpatient Prescriptions on File Prior to Visit  Medication Sig Dispense Refill  . aspirin 81 MG tablet Take 81 mg by mouth daily.        Marland Kitchen atenolol (TENORMIN) 50 MG tablet Take 0.5 tablets (25 mg total) by mouth daily.  45 tablet  3  . benazepril (LOTENSIN) 20 MG tablet Take 1 tablet (20 mg total) by mouth daily.  90 tablet  3  . clopidogrel (PLAVIX) 75 MG tablet Take 1 tablet (75 mg total) by mouth daily.  90 tablet  3  . finasteride (PROSCAR) 5 MG tablet Take 1 tablet (5 mg total) by mouth daily.  90 tablet  3  . glipiZIDE (GLIPIZIDE XL) 2.5 MG 24 hr tablet Take 1 tablet (2.5 mg total) by mouth daily.  90 tablet  3  . isosorbide mononitrate (IMDUR) 30 MG 24 hr tablet Take 1 tablet (30 mg total) by mouth daily.  90 tablet  3  . LIFESCAN FINEPOINT LANCETS MISC USE AS DIRECTED ONCE DAILY  100 each  10  . LORazepam (ATIVAN) 0.5 MG tablet Take 0.5-1 mg by mouth at bedtime as needed.        . metFORMIN (GLUCOPHAGE) 1000 MG tablet Take 0.5 tablets (500 mg total) by mouth 2 (two) times daily with a  meal.  90 tablet  3  . Multiple Vitamin (MULTIVITAMIN) capsule Take 1 capsule by mouth daily.        Marland Kitchen omeprazole (PRILOSEC) 20 MG capsule Take 1 capsule (20 mg total) by mouth daily.  90 capsule  3  . ONE TOUCH ULTRA TEST test strip TEST AS DIRECTED EVERY DAY  100 each  10  . polyethylene glycol (MIRALAX / GLYCOLAX) packet Take 17 g by mouth 2 (two) times daily.  180 packet  3  . Tamsulosin HCl (FLOMAX) 0.4 MG CAPS Take 1 capsule (0.4 mg total) by mouth daily.  90 capsule  3    No Known Allergies  Past Medical History  Diagnosis Date  . CAD (coronary artery disease)   . Diverticulitis   . GERD (gastroesophageal reflux disease)   . Hyperlipidemia   . Hypertension   . BPH (benign prostatic hypertrophy)   . Diabetes mellitus   . DJD (degenerative joint disease)     lumbar spine  . IBS (irritable bowel syndrome)   . Stroke   . Depression 2012    Past Surgical History  Procedure Date  . Angioplasty 1995  . Doppler echocardiography 11/1999  syncope  . Pacemaker insertion     Pacer/cath  (ef 65%)  1/01 -  guidant Discovery II DR pulse generator  . Prostate surgery 07/2003  . Esophagogastroduodenoscopy 07/2005    neg    Family History  Problem Relation Age of Onset  . Diabetes Sister     History   Social History  . Marital Status: Widowed    Spouse Name: N/A    Number of Children: 5  . Years of Education: N/A   Occupational History  . 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 Smoking status: Former Games developer  . Smokeless tobacco: Never Used  . Alcohol Use: No     heavy in the past  . Drug Use: Not on file  . Sexually Active: Not on file   Other Topics Concern  . Not on file   Social History Narrative   Lives with son and DIL   Review of Systems Sleeps okay  Weight is down a few more pounds    Objective:   Physical Exam  Constitutional: He appears well-developed. No distress.  Neck: Normal range of motion. Neck  supple. No thyromegaly present.  Cardiovascular: Normal rate, regular rhythm and normal heart sounds.  Exam reveals no gallop.   No murmur heard. Pulmonary/Chest: Effort normal and breath sounds normal. No respiratory distress. He has no wheezes. He has no rales.  Abdominal: Soft. He exhibits no mass. There is no tenderness.  Musculoskeletal: He exhibits no edema and no tenderness.  Lymphadenopathy:    He has no cervical adenopathy.  Psychiatric:       Sedate but not clearly depressed  Normal engagement Affect is somewhat blunted          Assessment & Plan:

## 2012-01-13 NOTE — Assessment & Plan Note (Signed)
Still with urgency and decreased stream Seems better  May still improve more on the finasteride

## 2012-01-13 NOTE — Assessment & Plan Note (Signed)
Needs labs If A1c still under 7% and maybe even 7.5%, I would stop the glipizide

## 2012-01-13 NOTE — Patient Instructions (Signed)
Stop atenolol Will have Teressa draw labs---  HgbA1c (250.40), met c, CBC with diff (401.9)

## 2012-01-13 NOTE — Assessment & Plan Note (Signed)
Seems to be quiet On isosorbide and ACEI Atenolol may be most likely to be causing weakness so will try off this

## 2012-01-15 ENCOUNTER — Encounter: Payer: Self-pay | Admitting: Internal Medicine

## 2012-01-15 DIAGNOSIS — R627 Adult failure to thrive: Secondary | ICD-10-CM

## 2012-01-27 ENCOUNTER — Encounter: Payer: Self-pay | Admitting: *Deleted

## 2012-02-10 ENCOUNTER — Telehealth: Payer: Self-pay | Admitting: Internal Medicine

## 2012-02-10 NOTE — Telephone Encounter (Signed)
Spoke with patient and advised results, he was confused between the metformin and glipizide, I also called the hospice nurse and left her a message with the results. Pt states she will double up on the glipizide until its gone.

## 2012-02-10 NOTE — Telephone Encounter (Signed)
Please have him increase the glipizide XL to 5mg  daily New Rx for 1 year but he can take 2 of the 2.5mg  tabs till they are out  Call him

## 2012-02-10 NOTE — Telephone Encounter (Signed)
Pt's Hospice Nurse Rosey Bath is calling about pt's blood sugar levels. She is saying it's 299. Pt said he hasn't been eating any thing sweet or anything out of the ordinary. He does not check his blood sugar everyday so the nurse doesn't know if it's been running high lately or just happened today. She wanted to let you know. The nurse also wanted to know if there was anything she should do about changing his meds. She is saying if you want him to do anything different to call the patient or you can leave a message on her phone it's [phone].

## 2012-03-22 ENCOUNTER — Other Ambulatory Visit: Payer: Self-pay | Admitting: Internal Medicine

## 2012-03-23 ENCOUNTER — Telehealth: Payer: Self-pay | Admitting: *Deleted

## 2012-03-23 NOTE — Telephone Encounter (Signed)
Form signed and sent for faxing

## 2012-03-23 NOTE — Telephone Encounter (Signed)
Form on your desk asking for diabetes supplies from [facility], pt uses this company.

## 2012-04-01 ENCOUNTER — Telehealth: Payer: Self-pay | Admitting: Internal Medicine

## 2012-04-01 NOTE — Telephone Encounter (Signed)
Pt calling wanting to speak to [facility] about his pacemaker. He said that he has not had it checked in a long while and not sure what he suppose to do.

## 2012-04-01 NOTE — Telephone Encounter (Signed)
[place] office to call and schedule an appointment with the device clinic.

## 2012-04-11 ENCOUNTER — Encounter: Payer: Self-pay | Admitting: Internal Medicine

## 2012-04-11 ENCOUNTER — Ambulatory Visit (INDEPENDENT_AMBULATORY_CARE_PROVIDER_SITE_OTHER): Payer: Medicare Other | Admitting: Internal Medicine

## 2012-04-11 VITALS — BP 170/80 | HR 97 | Temp 97.8°F | Wt 128.0 lb

## 2012-04-11 DIAGNOSIS — Z79899 Other long term (current) drug therapy: Secondary | ICD-10-CM

## 2012-04-11 DIAGNOSIS — F329 Major depressive disorder, single episode, unspecified: Secondary | ICD-10-CM

## 2012-04-11 DIAGNOSIS — F3289 Other specified depressive episodes: Secondary | ICD-10-CM

## 2012-04-11 DIAGNOSIS — I10 Essential (primary) hypertension: Secondary | ICD-10-CM

## 2012-04-11 DIAGNOSIS — E1129 Type 2 diabetes mellitus with other diabetic kidney complication: Secondary | ICD-10-CM

## 2012-04-11 DIAGNOSIS — I6990 Unspecified sequelae of unspecified cerebrovascular disease: Secondary | ICD-10-CM

## 2012-04-11 DIAGNOSIS — B351 Tinea unguium: Secondary | ICD-10-CM

## 2012-04-11 DIAGNOSIS — N058 Unspecified nephritic syndrome with other morphologic changes: Secondary | ICD-10-CM

## 2012-04-11 NOTE — Assessment & Plan Note (Signed)
Has not felt the same since this Diffuse weakness but no distinct findings

## 2012-04-11 NOTE — Assessment & Plan Note (Signed)
BP Readings from Last 3 Encounters:  04/11/12 170/80  01/13/12 100/52  10/21/11 102/64   Has been low Just high today No change for now

## 2012-04-11 NOTE — Assessment & Plan Note (Signed)
Will recheck labs If A1c under 7.5%, will stop the glipizide to reduce risk of hypoglycemia Podiatry consult

## 2012-04-11 NOTE — Assessment & Plan Note (Signed)
Eating better and weight up  Sleeps okay Some degree of anhedonia Did poorly on mirtazapine---will hold off on meds for now

## 2012-04-11 NOTE — Progress Notes (Signed)
Subjective:    Patient ID: Andres [name], male    DOB: 03/19/1926, 78 y.o.   MRN: [id number]  HPI Here with son Hospice is discharging or has discharged  Hasn't felt well since stroke 2 years ago Ongoing problems with bowels Some trouble passing urine  Nose feels congested Sinus problmes Itching along temples and forehead  Checks sugars fasting 91-117 No hypoglycemic spells  Multiple complaints RN is concerned about depression (hospice) Just had [facility] check up  Current Outpatient Prescriptions on File Prior to Visit  Medication Sig Dispense Refill  . aspirin 81 MG tablet Take 81 mg by mouth daily.        . benazepril (LOTENSIN) 20 MG tablet Take 1 tablet (20 mg total) by mouth daily.  90 tablet  3  . clopidogrel (PLAVIX) 75 MG tablet Take 1 tablet (75 mg total) by mouth daily.  90 tablet  3  . finasteride (PROSCAR) 5 MG tablet Take 1 tablet (5 mg total) by mouth daily.  90 tablet  3  . glipiZIDE (GLIPIZIDE XL) 2.5 MG 24 hr tablet Take 1 tablet (2.5 mg total) by mouth daily.  90 tablet  3  . isosorbide mononitrate (IMDUR) 30 MG 24 hr tablet Take 1 tablet (30 mg total) by mouth daily.  90 tablet  3  . LIFESCAN FINEPOINT LANCETS MISC USE AS DIRECTED ONCE DAILY  100 each  10  . metFORMIN (GLUCOPHAGE) 1000 MG tablet Take 0.5 tablets (500 mg total) by mouth 2 (two) times daily with a meal.  90 tablet  3  . Multiple Vitamin (MULTIVITAMIN) capsule Take 1 capsule by mouth daily.        Marland Kitchen omeprazole (PRILOSEC) 20 MG capsule Take 1 capsule (20 mg total) by mouth daily.  90 capsule  3  . ONE TOUCH ULTRA TEST test strip TEST AS DIRECTED EVERY DAY  100 each  10  . sennosides-docusate sodium (SENOKOT-S) 8.6-50 MG tablet Take 1 tablet by mouth daily. And 2 in the evening      . Tamsulosin HCl (FLOMAX) 0.4 MG CAPS Take 1 capsule (0.4 mg total) by mouth daily.  90 capsule  3    No Known Allergies  Past Medical History  Diagnosis Date  . CAD (coronary artery disease)   .  Diverticulitis   . GERD (gastroesophageal reflux disease)   . Hyperlipidemia   . Hypertension   . BPH (benign prostatic hypertrophy)   . Diabetes mellitus   . DJD (degenerative joint disease)     lumbar spine  . IBS (irritable bowel syndrome)   . Stroke   . Depression 2012    Past Surgical History  Procedure Date  . Angioplasty 1995  . Doppler echocardiography 11/1999    syncope  . Pacemaker insertion     Pacer/cath  (ef 65%)  1/01 -  guidant Discovery II DR pulse generator  . Prostate surgery 07/2003  . Esophagogastroduodenoscopy 07/2005    neg    Family History  Problem Relation Age of Onset  . Diabetes Sister     History   Social History  . Marital Status: Widowed    Spouse Name: N/A    Number of Children: 5  . Years of Education: N/A   Occupational History  . 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 Smoking status: Former Games developer  . Smokeless tobacco: Never Used  . Alcohol Use: No     heavy in the past  .  Drug Use: Not on file  . Sexually Active: Not on file   Other Topics Concern  . Not on file   Social History Narrative   Lives with son and DIL   Review of Systems Appetite may be some better Weight is up a few pounds Sleeping fairly well    Objective:   Physical Exam  Constitutional: He appears well-developed and well-nourished. No distress.  Neck: Normal range of motion. Neck supple.  Cardiovascular: Normal rate, regular rhythm and normal heart sounds.  Exam reveals no gallop.   No murmur heard.      Pulses not palpable in feet but good circulation  Pulmonary/Chest: Effort normal and breath sounds normal. No respiratory distress. He has no wheezes. He has no rales.  Abdominal: Soft. There is no tenderness.  Lymphadenopathy:    He has no cervical adenopathy.  Skin:       Thick mycotic left great toenail  Psychiatric:       Seems depressed Appropriate affect Laundry list of complaints            Assessment & Plan:

## 2012-04-11 NOTE — Assessment & Plan Note (Signed)
Will set up with podiatry

## 2012-04-12 LAB — CBC WITH DIFFERENTIAL/PLATELET
Basophils Absolute: 0 10*3/uL (ref 0.0–0.1)
Eosinophils Absolute: 0.2 10*3/uL (ref 0.0–0.7)
Hemoglobin: 10.3 g/dL — ABNORMAL LOW (ref 13.0–17.0)
Lymphocytes Relative: 25.3 % (ref 12.0–46.0)
MCHC: 33.2 g/dL (ref 30.0–36.0)
Neutro Abs: 3.9 10*3/uL (ref 1.4–7.7)
Neutrophils Relative %: 63.5 % (ref 43.0–77.0)
Platelets: 141 10*3/uL — ABNORMAL LOW (ref 150.0–400.0)
RDW: 13.9 % (ref 11.5–14.6)

## 2012-04-12 LAB — LIPID PANEL
Cholesterol: 190 mg/dL (ref 0–200)
Total CHOL/HDL Ratio: 5
Triglycerides: 350 mg/dL — ABNORMAL HIGH (ref 0.0–149.0)
VLDL: 70 mg/dL — ABNORMAL HIGH (ref 0.0–40.0)

## 2012-04-12 LAB — BASIC METABOLIC PANEL
BUN: 22 mg/dL (ref 6–23)
CO2: 29 mEq/L (ref 19–32)
Calcium: 9.6 mg/dL (ref 8.4–10.5)
Creatinine, Ser: 1.6 mg/dL — ABNORMAL HIGH (ref 0.4–1.5)
GFR: 42.75 mL/min — ABNORMAL LOW (ref >60.00)
Glucose, Bld: 136 mg/dL — ABNORMAL HIGH (ref 70–99)

## 2012-04-12 LAB — MICROALBUMIN / CREATININE URINE RATIO: Microalb, Ur: 9 mg/dL — ABNORMAL HIGH (ref 0.0–1.9)

## 2012-04-12 LAB — HEPATIC FUNCTION PANEL
Bilirubin, Direct: 0 mg/dL (ref 0.0–0.3)
Total Protein: 7.6 g/dL (ref 6.0–8.3)

## 2012-04-12 LAB — TSH: TSH: 1.36 u[IU]/mL (ref 0.35–5.50)

## 2012-04-13 ENCOUNTER — Encounter: Payer: Self-pay | Admitting: *Deleted

## 2012-06-09 ENCOUNTER — Telehealth: Payer: Self-pay | Admitting: Internal Medicine

## 2012-06-09 NOTE — Telephone Encounter (Signed)
Gunnar Fusi, patient called with questions regarding his remote check.  He would like for you to call him back on his cell phone

## 2012-06-10 NOTE — Telephone Encounter (Signed)
Spoke with patient.  He has not had his device checked since 2012 and is no longer enrolled in [place].  I will have the [place] office schedule him an appointment with me in [place] and reorder his [facility].  Since he no longer drives his son will bring him to that appointment which will give me an opportunity to show him the transmission procedure.

## 2012-06-13 ENCOUNTER — Telehealth: Payer: Self-pay | Admitting: *Deleted

## 2012-06-13 NOTE — Telephone Encounter (Signed)
Left message for patient.  We did not receive his transmission and to bring the transmitter with him 06/27/12@ 4pm and I will go over the instructions again.

## 2012-06-13 NOTE — Telephone Encounter (Signed)
New msg Pt wants to see if transmission went thru please call

## 2012-06-20 ENCOUNTER — Encounter: Payer: Self-pay | Admitting: *Deleted

## 2012-06-20 DIAGNOSIS — Z950 Presence of cardiac pacemaker: Secondary | ICD-10-CM | POA: Insufficient documentation

## 2012-06-27 ENCOUNTER — Ambulatory Visit (INDEPENDENT_AMBULATORY_CARE_PROVIDER_SITE_OTHER): Payer: Medicare Other | Admitting: *Deleted

## 2012-06-27 ENCOUNTER — Encounter: Payer: Self-pay | Admitting: Internal Medicine

## 2012-06-27 DIAGNOSIS — I442 Atrioventricular block, complete: Secondary | ICD-10-CM

## 2012-06-27 LAB — PACEMAKER DEVICE OBSERVATION
AL IMPEDENCE PM: 585 Ohm
AL THRESHOLD: 0.75 V
ATRIAL PACING PM: 2
BATTERY VOLTAGE: 2.79 V

## 2012-06-27 NOTE — Progress Notes (Signed)
PPM check 

## 2012-07-25 ENCOUNTER — Ambulatory Visit: Payer: Medicare Other | Admitting: Internal Medicine

## 2012-07-25 DIAGNOSIS — Z0289 Encounter for other administrative examinations: Secondary | ICD-10-CM

## 2012-09-09 DIAGNOSIS — I219 Acute myocardial infarction, unspecified: Secondary | ICD-10-CM

## 2012-09-09 HISTORY — DX: Acute myocardial infarction, unspecified: I21.9

## 2012-09-09 HISTORY — PX: CARDIAC CATHETERIZATION: SHX172

## 2012-09-19 ENCOUNTER — Inpatient Hospital Stay: Payer: Self-pay | Admitting: Student

## 2012-09-19 DIAGNOSIS — I214 Non-ST elevation (NSTEMI) myocardial infarction: Secondary | ICD-10-CM

## 2012-09-19 LAB — COMPREHENSIVE METABOLIC PANEL
Albumin: 4 g/dL (ref 3.4–5.0)
Alkaline Phosphatase: 79 U/L (ref 50–136)
BUN: 25 mg/dL — ABNORMAL HIGH (ref 7–18)
Calcium, Total: 9.6 mg/dL (ref 8.5–10.1)
Co2: 23 mmol/L (ref 21–32)
EGFR (Non-African Amer.): 44 — ABNORMAL LOW
Glucose: 190 mg/dL — ABNORMAL HIGH (ref 65–99)
SGOT(AST): 13 U/L — ABNORMAL LOW (ref 15–37)
SGPT (ALT): 14 U/L (ref 12–78)
Sodium: 140 mmol/L (ref 136–145)

## 2012-09-19 LAB — CBC WITH DIFFERENTIAL/PLATELET
Eosinophil %: 0.9 %
HCT: 28 % — ABNORMAL LOW (ref 40.0–52.0)
Lymphocyte #: 1.2 10*3/uL (ref 1.0–3.6)
Lymphocyte %: 14.6 %
MCV: 93 fL (ref 80–100)
Monocyte %: 7.7 %
Neutrophil #: 6.3 10*3/uL (ref 1.4–6.5)
RBC: 3.02 10*6/uL — ABNORMAL LOW (ref 4.40–5.90)
WBC: 8.5 10*3/uL (ref 3.8–10.6)

## 2012-09-19 LAB — CK TOTAL AND CKMB (NOT AT ARMC)
CK, Total: 52 U/L (ref 35–232)
CK, Total: 172 U/L (ref 35–232)
CK, Total: 146 U/L (ref 35–232)
CK-MB: 1 ng/mL (ref 0.5–3.6)
CK-MB: 21.1 ng/mL — ABNORMAL HIGH (ref 0.5–3.6)
CK-MB: 12.3 ng/mL — ABNORMAL HIGH (ref 0.5–3.6)

## 2012-09-19 LAB — CBC
HCT: 37 % — ABNORMAL LOW (ref 40.0–52.0)
HGB: 12.8 g/dL — ABNORMAL LOW (ref 13.0–18.0)
MCH: 32.2 pg (ref 26.0–34.0)
MCHC: 34.5 g/dL (ref 32.0–36.0)
MCV: 93 fL (ref 80–100)
RBC: 3.97 10*6/uL — ABNORMAL LOW (ref 4.40–5.90)

## 2012-09-19 LAB — APTT
Activated PTT: 36 secs — ABNORMAL HIGH (ref 23.6–35.9)
Activated PTT: 126.5 secs — ABNORMAL HIGH (ref 23.6–35.9)

## 2012-09-19 LAB — PRO B NATRIURETIC PEPTIDE: B-Type Natriuretic Peptide: 6789 pg/mL — ABNORMAL HIGH (ref 0–450)

## 2012-09-20 DIAGNOSIS — I2510 Atherosclerotic heart disease of native coronary artery without angina pectoris: Secondary | ICD-10-CM

## 2012-09-20 LAB — BASIC METABOLIC PANEL
BUN: 27 mg/dL — ABNORMAL HIGH (ref 7–18)
Calcium, Total: 8.7 mg/dL (ref 8.5–10.1)
Creatinine: 1.6 mg/dL — ABNORMAL HIGH (ref 0.60–1.30)
EGFR (African American): 45 — ABNORMAL LOW
EGFR (Non-African Amer.): 39 — ABNORMAL LOW
Glucose: 139 mg/dL — ABNORMAL HIGH (ref 65–99)
Potassium: 4.1 mmol/L (ref 3.5–5.1)
Sodium: 142 mmol/L (ref 136–145)

## 2012-09-20 LAB — CBC WITH DIFFERENTIAL/PLATELET
Basophil %: 0.8 %
Eosinophil %: 3 %
HGB: 9.5 g/dL — ABNORMAL LOW (ref 13.0–18.0)
Lymphocyte #: 2 10*3/uL (ref 1.0–3.6)
MCH: 32.1 pg (ref 26.0–34.0)
MCV: 93 fL (ref 80–100)
Monocyte #: 0.5 x10 3/mm (ref 0.2–1.0)
RBC: 2.97 10*6/uL — ABNORMAL LOW (ref 4.40–5.90)

## 2012-09-20 LAB — LIPID PANEL
Ldl Cholesterol, Calc: 93 mg/dL (ref 0–100)
VLDL Cholesterol, Calc: 24 mg/dL (ref 5–40)

## 2012-09-20 LAB — HEMOGLOBIN A1C: Hemoglobin A1C: 5.8 % (ref 4.2–6.3)

## 2012-09-20 LAB — CK TOTAL AND CKMB (NOT AT ARMC)
CK, Total: 73 U/L (ref 35–232)
CK-MB: 3.3 ng/mL (ref 0.5–3.6)

## 2012-09-20 LAB — TROPONIN I: Troponin-I: 2.2 ng/mL — ABNORMAL HIGH

## 2012-09-21 LAB — BASIC METABOLIC PANEL
BUN: 22 mg/dL — ABNORMAL HIGH (ref 7–18)
Calcium, Total: 8.8 mg/dL (ref 8.5–10.1)
Chloride: 103 mmol/L (ref 98–107)
Co2: 28 mmol/L (ref 21–32)
Creatinine: 1.41 mg/dL — ABNORMAL HIGH (ref 0.60–1.30)
EGFR (Non-African Amer.): 45 — ABNORMAL LOW
Glucose: 89 mg/dL (ref 65–99)
Potassium: 4 mmol/L (ref 3.5–5.1)
Sodium: 140 mmol/L (ref 136–145)

## 2012-09-21 LAB — CBC WITH DIFFERENTIAL/PLATELET
Basophil #: 0 10*3/uL (ref 0.0–0.1)
Eosinophil %: 3 %
Lymphocyte #: 1.6 10*3/uL (ref 1.0–3.6)
Lymphocyte %: 26.9 %
Monocyte %: 8.8 %
Neutrophil #: 3.5 10*3/uL (ref 1.4–6.5)
Neutrophil %: 60.5 %
Platelet: 105 10*3/uL — ABNORMAL LOW (ref 150–440)
RDW: 14.1 % (ref 11.5–14.5)
WBC: 5.8 10*3/uL (ref 3.8–10.6)

## 2012-09-22 ENCOUNTER — Encounter: Payer: Self-pay | Admitting: Cardiovascular Disease

## 2012-09-22 LAB — BASIC METABOLIC PANEL
Calcium, Total: 9.2 mg/dL (ref 8.5–10.1)
Co2: 29 mmol/L (ref 21–32)
Creatinine: 1.41 mg/dL — ABNORMAL HIGH (ref 0.60–1.30)
Potassium: 4.8 mmol/L (ref 3.5–5.1)
Sodium: 140 mmol/L (ref 136–145)

## 2012-09-22 LAB — OCCULT BLOOD X 1 CARD TO LAB, STOOL: Occult Blood, Feces: NEGATIVE

## 2012-09-24 LAB — CULTURE, BLOOD (SINGLE)

## 2012-09-29 ENCOUNTER — Ambulatory Visit: Payer: Medicare Other | Admitting: Internal Medicine

## 2012-10-03 ENCOUNTER — Ambulatory Visit (INDEPENDENT_AMBULATORY_CARE_PROVIDER_SITE_OTHER): Payer: Medicare Other | Admitting: Cardiovascular Disease

## 2012-10-03 ENCOUNTER — Encounter: Payer: Medicare Other | Admitting: *Deleted

## 2012-10-03 ENCOUNTER — Encounter: Payer: Self-pay | Admitting: Cardiovascular Disease

## 2012-10-03 VITALS — BP 126/60 | HR 76 | Ht 68.0 in | Wt 220.0 lb

## 2012-10-03 DIAGNOSIS — I2510 Atherosclerotic heart disease of native coronary artery without angina pectoris: Secondary | ICD-10-CM

## 2012-10-03 DIAGNOSIS — F329 Major depressive disorder, single episode, unspecified: Secondary | ICD-10-CM

## 2012-10-03 DIAGNOSIS — I10 Essential (primary) hypertension: Secondary | ICD-10-CM

## 2012-10-03 DIAGNOSIS — I442 Atrioventricular block, complete: Secondary | ICD-10-CM

## 2012-10-03 DIAGNOSIS — E785 Hyperlipidemia, unspecified: Secondary | ICD-10-CM

## 2012-10-03 NOTE — Assessment & Plan Note (Signed)
Blood pressure is well controlled on today's visit. No changes made to the medications. 

## 2012-10-03 NOTE — Assessment & Plan Note (Signed)
I suspect his depression is slowing his recovery. His son and I tried to encourage him to increase his activity, minimize the sleeping in the daytime.

## 2012-10-03 NOTE — Assessment & Plan Note (Signed)
History of pacemaker placement. Followed by Dr. Graciela Husbands.

## 2012-10-03 NOTE — Progress Notes (Signed)
Patient ID: Andres [name], male    DOB: 09-Aug-1926, 78 y.o.   MRN: [id number]  HPI Comments: 78 year old gentleman with diabetes, hypertension, chronic renal insufficiency, history of CVA, heart block with pacemaker placement, recent non-ST elevation MI 09/19/2012 with admission to the hospital, cardiac catheterization showing occluded mid LAD, heavily calcified left circumflex with severe ostial disease also occluded nondominant RCA. Stent was placed to his ostial left circumflex. Unable to stent the AV groove circumflex. He presents for hospital followup.  He is currently staying at [facility]. His son reports that he is not getting out of bed much. Not walking with a walker get. Still with indwelling Foley catheter. He appears depressed with low motivation. The patient complains of sacral discomfort. Eating more at [facility]. Still losing weight overall. He denies any significant chest pain since the stent placement. Patient reports fall prior to hospitalization leading to his cardiac evaluation.  Echocardiogram 09/20/2012 shows ejection fraction 50-55%, mild LVH, pacemaker in the right ventricle  EKG shows paced rhythm at 76 beats per minute   Outpatient Encounter Prescriptions as of 10/03/2012  Medication Sig Dispense Refill  . acetaminophen (TYLENOL) 325 MG tablet Take 650 mg by mouth every 6 (six) hours as needed.      Marland Kitchen aspirin 81 MG tablet Take 81 mg by mouth daily.        Marland Kitchen atorvastatin (LIPITOR) 10 MG tablet Take 10 mg by mouth daily.      . clopidogrel (PLAVIX) 75 MG tablet Take 1 tablet (75 mg total) by mouth daily.  90 tablet  3  . finasteride (PROSCAR) 5 MG tablet Take 1 tablet (5 mg total) by mouth daily.  90 tablet  3  . glipiZIDE (GLIPIZIDE XL) 2.5 MG 24 hr tablet Take 1 tablet (2.5 mg total) by mouth daily.  90 tablet  3  . isosorbide mononitrate (IMDUR) 30 MG 24 hr tablet Take 1 tablet (30 mg total) by mouth daily.  90 tablet  3  . LIFESCAN FINEPOINT LANCETS MISC  USE AS DIRECTED ONCE DAILY  100 each  10  . metoprolol tartrate (LOPRESSOR) 25 MG tablet Take 25 mg by mouth 2 (two) times daily.      . Multiple Vitamin (MULTIVITAMIN) capsule Take 1 capsule by mouth daily.        . nitroGLYCERIN (NITROSTAT) 0.4 MG SL tablet Place 0.4 mg under the tongue every 5 (five) minutes as needed.      . ONE TOUCH ULTRA TEST test strip TEST AS DIRECTED EVERY DAY  100 each  10  . Tamsulosin HCl (FLOMAX) 0.4 MG CAPS Take 1 capsule (0.4 mg total) by mouth daily.  90 capsule  3     Review of Systems  Constitutional: Negative.   HENT: Negative.   Eyes: Negative.   Respiratory: Negative.   Cardiovascular: Negative.   Gastrointestinal: Negative.   Musculoskeletal: Positive for arthralgias and gait problem.       Sacral soreness  Skin: Negative.   Neurological: Negative.   Hematological: Negative.   Psychiatric/Behavioral: Positive for dysphoric mood.  All other systems reviewed and are negative.    BP 126/60  Pulse 76  Ht 5\' 8"  (1.727 m)  Wt 220 lb (99.791 kg)  BMI 33.45 kg/m2  Physical Exam  Nursing note and vitals reviewed. Constitutional: He is oriented to person, place, and time. He appears well-developed and well-nourished.  HENT:  Head: Normocephalic.  Nose: Nose normal.  Mouth/Throat: Oropharynx is clear and moist.  Eyes:  Conjunctivae normal are normal. Pupils are equal, round, and reactive to light.  Neck: Normal range of motion. Neck supple. No JVD present.  Cardiovascular: Normal rate, regular rhythm, S1 normal, S2 normal, normal heart sounds and intact distal pulses.  Exam reveals no gallop and no friction rub.   No murmur heard. Pulmonary/Chest: Effort normal and breath sounds normal. No respiratory distress. He has no wheezes. He has no rales. He exhibits no tenderness.  Abdominal: Soft. Bowel sounds are normal. He exhibits no distension. There is no tenderness.  Musculoskeletal: Normal range of motion. He exhibits no edema and no  tenderness.  Lymphadenopathy:    He has no cervical adenopathy.  Neurological: He is alert and oriented to person, place, and time. Coordination normal.  Skin: Skin is warm and dry. No rash noted. No erythema.  Psychiatric: He has a normal mood and affect. His behavior is normal. Judgment and thought content normal.           Assessment and Plan

## 2012-10-03 NOTE — Patient Instructions (Addendum)
You are doing well. No medication changes were made.  Please call us if you have new issues that need to be addressed before your next appt.  Your physician wants you to follow-up in: 6 months.  You will receive a reminder letter in the mail two months in advance. If you don't receive a letter, please call our office to schedule the follow-up appointment.   

## 2012-10-03 NOTE — Assessment & Plan Note (Signed)
Currently on Lipitor 10 mg daily. 

## 2012-10-03 NOTE — Assessment & Plan Note (Signed)
Recent stent placed to his ostial left circumflex. Significant residual three-vessel disease. Currently with no chest pain. Minimally active. We'll continue his current medications

## 2012-10-11 ENCOUNTER — Encounter: Payer: Self-pay | Admitting: *Deleted

## 2012-11-08 ENCOUNTER — Telehealth: Payer: Self-pay | Admitting: Cardiovascular Disease

## 2012-11-08 NOTE — Telephone Encounter (Signed)
New Problem:    Patient would like to know if the transmission he sent today was received.  Please call back.

## 2012-11-10 NOTE — Telephone Encounter (Signed)
LMOVM in regards to transmission. Transmission not received. Advised pt to call back or call tech services in regards to transmitter/kwm

## 2012-11-14 ENCOUNTER — Ambulatory Visit (INDEPENDENT_AMBULATORY_CARE_PROVIDER_SITE_OTHER): Payer: Medicare Other | Admitting: Internal Medicine

## 2012-11-14 ENCOUNTER — Encounter: Payer: Self-pay | Admitting: Internal Medicine

## 2012-11-14 VITALS — BP 148/80 | HR 66 | Temp 98.3°F | Wt 129.0 lb

## 2012-11-14 DIAGNOSIS — Z23 Encounter for immunization: Secondary | ICD-10-CM

## 2012-11-14 DIAGNOSIS — I10 Essential (primary) hypertension: Secondary | ICD-10-CM

## 2012-11-14 DIAGNOSIS — F329 Major depressive disorder, single episode, unspecified: Secondary | ICD-10-CM

## 2012-11-14 DIAGNOSIS — E1129 Type 2 diabetes mellitus with other diabetic kidney complication: Secondary | ICD-10-CM

## 2012-11-14 DIAGNOSIS — F32A Depression, unspecified: Secondary | ICD-10-CM

## 2012-11-14 DIAGNOSIS — I2510 Atherosclerotic heart disease of native coronary artery without angina pectoris: Secondary | ICD-10-CM

## 2012-11-14 DIAGNOSIS — N400 Enlarged prostate without lower urinary tract symptoms: Secondary | ICD-10-CM

## 2012-11-14 DIAGNOSIS — E785 Hyperlipidemia, unspecified: Secondary | ICD-10-CM

## 2012-11-14 MED ORDER — ATORVASTATIN CALCIUM 10 MG PO TABS: 10.0000 mg | ORAL_TABLET | Freq: Every day | ORAL | Status: DC

## 2012-11-14 MED ORDER — ISOSORBIDE MONONITRATE ER 30 MG PO TB24: 30.0000 mg | ORAL_TABLET | Freq: Every day | ORAL | Status: DC

## 2012-11-14 MED ORDER — CITALOPRAM HYDROBROMIDE 10 MG PO TABS: 10.0000 mg | ORAL_TABLET | Freq: Every day | ORAL | Status: DC

## 2012-11-14 MED ORDER — METOPROLOL TARTRATE 25 MG PO TABS: 25.0000 mg | ORAL_TABLET | Freq: Two times a day (BID) | ORAL | Status: DC

## 2012-11-14 MED ORDER — GLIPIZIDE ER 2.5 MG PO TB24: 2.5000 mg | ORAL_TABLET | Freq: Every day | ORAL | Status: DC

## 2012-11-14 MED ORDER — FINASTERIDE 5 MG PO TABS: 5.0000 mg | ORAL_TABLET | Freq: Every day | ORAL | Status: DC

## 2012-11-14 MED ORDER — CLOPIDOGREL BISULFATE 75 MG PO TABS: 75.0000 mg | ORAL_TABLET | Freq: Every day | ORAL | Status: DC

## 2012-11-14 MED ORDER — TAMSULOSIN HCL 0.4 MG PO CAPS: 0.4000 mg | ORAL_CAPSULE | Freq: Every day | ORAL | Status: DC

## 2012-11-14 NOTE — Assessment & Plan Note (Signed)
Has indwelling catheter Hopefully can be removed at next week's OV

## 2012-11-14 NOTE — Assessment & Plan Note (Signed)
Not checking sugars Will check A1c

## 2012-11-14 NOTE — Patient Instructions (Signed)
Please start the citalopram for your depression. Call if you have any side effects from this

## 2012-11-14 NOTE — Assessment & Plan Note (Signed)
BP Readings from Last 3 Encounters:  11/14/12 148/80  10/03/12 126/60  04/11/12 170/80   Reasonable control under his circumstances No change for now

## 2012-11-14 NOTE — Assessment & Plan Note (Signed)
Due for labs

## 2012-11-14 NOTE — Addendum Note (Signed)
Addended by: Sueanne Margarita on: 11/14/2012 04:57 PM   Modules accepted: Orders

## 2012-11-14 NOTE — Assessment & Plan Note (Signed)
Recent stent put in (November) Diffuse symptoms that don't seem to be from ischemia Continue current regimen

## 2012-11-14 NOTE — Assessment & Plan Note (Signed)
Seems to have worsened again Didn't do well with mirtazapine Will try low dose SSRI

## 2012-11-14 NOTE — Progress Notes (Signed)
Subjective:    Patient ID: Andres [name], male    DOB: [birth year], 79 y.o.   MRN: [id number]  HPI Son brought him but is out in waiting room  Larey Seat and was hospitalized Had non Q MI and got stent  Sent to [facility] for rehab Home since 12/20  Having congestion in his nose Finds it hard to breathe through his nose "I feel so bad"  Still has Foley catheter Goes back to urologist next week  No chest pain---but it "don't feel good" No obvious dyspnea--other than the nasal congestion  Concerns for his depression Clearly is anhedonic Has thought about dying but no suicidal ideation  Hasn't been checking sugars Can't figure out how to use new monitor  Current Outpatient Prescriptions on File Prior to Visit  Medication Sig Dispense Refill  . aspirin 81 MG tablet Take 81 mg by mouth daily.        Marland Kitchen atorvastatin (LIPITOR) 10 MG tablet Take 10 mg by mouth daily.      . clopidogrel (PLAVIX) 75 MG tablet Take 1 tablet (75 mg total) by mouth daily.  90 tablet  3  . finasteride (PROSCAR) 5 MG tablet Take 1 tablet (5 mg total) by mouth daily.  90 tablet  3  . glipiZIDE (GLIPIZIDE XL) 2.5 MG 24 hr tablet Take 1 tablet (2.5 mg total) by mouth daily.  90 tablet  3  . isosorbide mononitrate (IMDUR) 30 MG 24 hr tablet Take 1 tablet (30 mg total) by mouth daily.  90 tablet  3  . LIFESCAN FINEPOINT LANCETS MISC USE AS DIRECTED ONCE DAILY  100 each  10  . metoprolol tartrate (LOPRESSOR) 25 MG tablet Take 25 mg by mouth 2 (two) times daily.      . nitroGLYCERIN (NITROSTAT) 0.4 MG SL tablet Place 0.4 mg under the tongue every 5 (five) minutes as needed.      . ONE TOUCH ULTRA TEST test strip TEST AS DIRECTED EVERY DAY  100 each  10  . Tamsulosin HCl (FLOMAX) 0.4 MG CAPS Take 1 capsule (0.4 mg total) by mouth daily.  90 capsule  3    No Known Allergies  Past Medical History  Diagnosis Date  . CAD (coronary artery disease)   . Diverticulitis   . GERD (gastroesophageal reflux disease)     . Hyperlipidemia   . Hypertension   . BPH (benign prostatic hypertrophy)   . Diabetes mellitus   . DJD (degenerative joint disease)     lumbar spine  . IBS (irritable bowel syndrome)   . Stroke   . Depression 2012  . Acute MI 09/2012  . Chronic kidney disease     ACUTE RENAL FAILURE    Past Surgical History  Procedure Date  . Angioplasty 1995  . Doppler echocardiography 11/1999    syncope  . Pacemaker insertion     Pacer/cath  (ef 65%)  1/01 -  guidant Discovery II DR pulse generator  . Prostate surgery 07/2003  . Esophagogastroduodenoscopy 07/2005    neg  . Cardiac catheterization 09/2012    NSTEMI/STENT PLACED  . Insert / replace / remove pacemaker     [id number]    Family History  Problem Relation Age of Onset  . Diabetes Sister     History   Social History  . Marital Status: Widowed    Spouse Name: N/A    Number of Children: 5  . Years of Education: N/A   Occupational History  . 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 Smoking status: Former Games developer  . Smokeless tobacco: Never Used  . Alcohol Use: No     Comment: heavy in the past  . Drug Use: Not on file  . Sexually Active: Not on file   Other Topics Concern  . Not on file   Social History Narrative   Lives with son and DIL   Review of Systems Eating okay Bowels have been slow this week---had loose stools for a while    Objective:   Physical Exam  Constitutional: He appears well-developed. No distress.  Neck: Normal range of motion. Neck supple.  Cardiovascular: Normal rate, regular rhythm and normal heart sounds.  Exam reveals no gallop.   No murmur heard. Pulmonary/Chest: Effort normal and breath sounds normal. No respiratory distress. He has no wheezes. He has no rales.  Abdominal: Soft. There is no tenderness.  Musculoskeletal: He exhibits no edema.  Lymphadenopathy:    He has no cervical adenopathy.  Neurological:       No focal weakness   Psychiatric:       Seems depressed Affect appropriate to mood No thought process disturbance          Assessment & Plan:

## 2012-11-15 ENCOUNTER — Encounter: Payer: Self-pay | Admitting: *Deleted

## 2012-11-15 LAB — LIPID PANEL
Cholesterol: 125 mg/dL (ref 0–200)
LDL Cholesterol: 54 mg/dL (ref 0–99)
Triglycerides: 190 mg/dL — ABNORMAL HIGH (ref 0.0–149.0)

## 2012-11-15 LAB — CBC WITH DIFFERENTIAL/PLATELET
Eosinophils Absolute: 0.2 10*3/uL (ref 0.0–0.7)
Eosinophils Relative: 2.4 % (ref 0.0–5.0)
HCT: 33.6 % — ABNORMAL LOW (ref 39.0–52.0)
Lymphs Abs: 1.8 10*3/uL (ref 0.7–4.0)
MCHC: 33.6 g/dL (ref 30.0–36.0)
MCV: 94.5 fl (ref 78.0–100.0)
Monocytes Absolute: 0.5 10*3/uL (ref 0.1–1.0)
Platelets: 174 10*3/uL (ref 150.0–400.0)
WBC: 7.4 10*3/uL (ref 4.5–10.5)

## 2012-11-15 LAB — HEMOGLOBIN A1C: Hgb A1c MFr Bld: 7.7 % — ABNORMAL HIGH (ref 4.6–6.5)

## 2012-11-15 LAB — BASIC METABOLIC PANEL
BUN: 22 mg/dL (ref 6–23)
CO2: 29 mEq/L (ref 19–32)
Chloride: 101 mEq/L (ref 96–112)
Glucose, Bld: 202 mg/dL — ABNORMAL HIGH (ref 70–99)
Potassium: 4.2 mEq/L (ref 3.5–5.1)

## 2012-11-15 LAB — TSH: TSH: 1.57 u[IU]/mL (ref 0.35–5.50)

## 2012-11-15 LAB — HEPATIC FUNCTION PANEL
ALT: 13 U/L (ref 0–53)
AST: 19 U/L (ref 0–37)
Albumin: 3.9 g/dL (ref 3.5–5.2)

## 2012-11-21 ENCOUNTER — Telehealth: Payer: Self-pay | Admitting: Cardiovascular Disease

## 2012-11-21 NOTE — Telephone Encounter (Signed)
New Problem:    Patient called in returning your call and wanting to know how to proceed.  Please call back.

## 2012-11-22 NOTE — Telephone Encounter (Signed)
Left message for patient to send remote transmission.

## 2012-12-02 ENCOUNTER — Encounter: Payer: Self-pay | Admitting: *Deleted

## 2012-12-14 ENCOUNTER — Ambulatory Visit (INDEPENDENT_AMBULATORY_CARE_PROVIDER_SITE_OTHER): Payer: Medicare Other | Admitting: *Deleted

## 2012-12-14 DIAGNOSIS — I442 Atrioventricular block, complete: Secondary | ICD-10-CM

## 2012-12-14 DIAGNOSIS — Z950 Presence of cardiac pacemaker: Secondary | ICD-10-CM

## 2012-12-16 LAB — REMOTE PACEMAKER DEVICE
AL AMPLITUDE: 2.8 mv
AL IMPEDENCE PM: 468 Ohm
AL THRESHOLD: 0.5 V
BATTERY VOLTAGE: 2.79 V
RV LEAD IMPEDENCE PM: 1005 Ohm

## 2012-12-22 ENCOUNTER — Ambulatory Visit: Payer: Medicare Other | Admitting: Internal Medicine

## 2012-12-30 ENCOUNTER — Emergency Department: Payer: Self-pay | Admitting: Emergency Medicine

## 2012-12-30 LAB — URINALYSIS, COMPLETE
Bilirubin,UR: NEGATIVE
Ketone: NEGATIVE
Nitrite: NEGATIVE
Ph: 5 (ref 4.5–8.0)
Protein: 30
RBC,UR: 3 /HPF (ref 0–5)
Squamous Epithelial: NONE SEEN
WBC UR: 1 /HPF (ref 0–5)

## 2012-12-30 LAB — CBC
MCH: 32.4 pg (ref 26.0–34.0)
MCHC: 33.8 g/dL (ref 32.0–36.0)
MCV: 96 fL (ref 80–100)
Platelet: 143 10*3/uL — ABNORMAL LOW (ref 150–440)
RBC: 3.81 10*6/uL — ABNORMAL LOW (ref 4.40–5.90)
RDW: 14.7 % — ABNORMAL HIGH (ref 11.5–14.5)
WBC: 7.8 10*3/uL (ref 3.8–10.6)

## 2012-12-30 LAB — COMPREHENSIVE METABOLIC PANEL
Anion Gap: 7 (ref 7–16)
Bilirubin,Total: 0.4 mg/dL (ref 0.2–1.0)
Calcium, Total: 9.5 mg/dL (ref 8.5–10.1)
Chloride: 106 mmol/L (ref 98–107)
Co2: 25 mmol/L (ref 21–32)
Creatinine: 1.72 mg/dL — ABNORMAL HIGH (ref 0.60–1.30)
EGFR (African American): 41 — ABNORMAL LOW
EGFR (Non-African Amer.): 36 — ABNORMAL LOW
Glucose: 148 mg/dL — ABNORMAL HIGH (ref 65–99)
Osmolality: 284 (ref 275–301)
Potassium: 4.8 mmol/L (ref 3.5–5.1)
SGOT(AST): 52 U/L — ABNORMAL HIGH (ref 15–37)
Sodium: 138 mmol/L (ref 136–145)

## 2012-12-30 LAB — CK TOTAL AND CKMB (NOT AT ARMC): CK, Total: 32 U/L — ABNORMAL LOW (ref 35–232)

## 2013-01-17 ENCOUNTER — Encounter: Payer: Self-pay | Admitting: *Deleted

## 2013-01-19 ENCOUNTER — Encounter: Payer: Self-pay | Admitting: Cardiovascular Disease

## 2013-01-30 ENCOUNTER — Other Ambulatory Visit: Payer: Self-pay | Admitting: Internal Medicine

## 2013-03-01 ENCOUNTER — Emergency Department: Payer: Self-pay | Admitting: Emergency Medicine

## 2013-03-01 LAB — COMPREHENSIVE METABOLIC PANEL
Alkaline Phosphatase: 89 U/L (ref 50–136)
BUN: 27 mg/dL — ABNORMAL HIGH (ref 7–18)
Bilirubin,Total: 0.6 mg/dL (ref 0.2–1.0)
Co2: 27 mmol/L (ref 21–32)
Creatinine: 1.45 mg/dL — ABNORMAL HIGH (ref 0.60–1.30)
EGFR (African American): 51 — ABNORMAL LOW
EGFR (Non-African Amer.): 44 — ABNORMAL LOW
Glucose: 170 mg/dL — ABNORMAL HIGH (ref 65–99)
Potassium: 4 mmol/L (ref 3.5–5.1)
SGOT(AST): 23 U/L (ref 15–37)
SGPT (ALT): 16 U/L (ref 12–78)
Sodium: 139 mmol/L (ref 136–145)
Total Protein: 7.8 g/dL (ref 6.4–8.2)

## 2013-03-01 LAB — URINALYSIS, COMPLETE
Bacteria: NONE SEEN
Bilirubin,UR: NEGATIVE
Ketone: NEGATIVE
Leukocyte Esterase: NEGATIVE
Ph: 5 (ref 4.5–8.0)
Protein: 30
RBC,UR: 1 /HPF (ref 0–5)
Specific Gravity: 1.014 (ref 1.003–1.030)
Squamous Epithelial: 1
WBC UR: 2 /HPF (ref 0–5)

## 2013-03-01 LAB — CBC
HCT: 36.9 % — ABNORMAL LOW (ref 40.0–52.0)
HGB: 12.8 g/dL — ABNORMAL LOW (ref 13.0–18.0)
MCH: 32.5 pg (ref 26.0–34.0)
MCHC: 34.6 g/dL (ref 32.0–36.0)
RBC: 3.92 10*6/uL — ABNORMAL LOW (ref 4.40–5.90)
RDW: 14.7 % — ABNORMAL HIGH (ref 11.5–14.5)

## 2013-03-01 LAB — LIPASE, BLOOD: Lipase: 213 U/L (ref 73–393)

## 2013-03-02 ENCOUNTER — Inpatient Hospital Stay: Payer: Self-pay | Admitting: Internal Medicine

## 2013-03-02 LAB — CBC
HGB: 13 g/dL (ref 13.0–18.0)
MCH: 32.5 pg (ref 26.0–34.0)
MCHC: 34 g/dL (ref 32.0–36.0)
MCV: 96 fL (ref 80–100)
Platelet: 128 10*3/uL — ABNORMAL LOW (ref 150–440)
RBC: 4 10*6/uL — ABNORMAL LOW (ref 4.40–5.90)

## 2013-03-02 LAB — URINALYSIS, COMPLETE
Bilirubin,UR: NEGATIVE
Glucose,UR: 50 mg/dL (ref 0–75)
Nitrite: NEGATIVE
Ph: 5 (ref 4.5–8.0)
RBC,UR: 1972 /HPF (ref 0–5)
Specific Gravity: 1.017 (ref 1.003–1.030)
WBC UR: 33 /HPF (ref 0–5)

## 2013-03-02 LAB — COMPREHENSIVE METABOLIC PANEL
Albumin: 4.5 g/dL (ref 3.4–5.0)
Anion Gap: 18 — ABNORMAL HIGH (ref 7–16)
BUN: 42 mg/dL — ABNORMAL HIGH (ref 7–18)
Bilirubin,Total: 1 mg/dL (ref 0.2–1.0)
Calcium, Total: 9.7 mg/dL (ref 8.5–10.1)
Chloride: 99 mmol/L (ref 98–107)
Co2: 18 mmol/L — ABNORMAL LOW (ref 21–32)
EGFR (Non-African Amer.): 27 — ABNORMAL LOW
Osmolality: 286 (ref 275–301)
Potassium: 4.2 mmol/L (ref 3.5–5.1)
Total Protein: 8.5 g/dL — ABNORMAL HIGH (ref 6.4–8.2)

## 2013-03-02 LAB — TROPONIN I: Troponin-I: 0.02 ng/mL

## 2013-03-03 LAB — BASIC METABOLIC PANEL
Anion Gap: 7 (ref 7–16)
Calcium, Total: 8.8 mg/dL (ref 8.5–10.1)
Chloride: 107 mmol/L (ref 98–107)
Co2: 27 mmol/L (ref 21–32)
Creatinine: 1.79 mg/dL — ABNORMAL HIGH (ref 0.60–1.30)
EGFR (African American): 39 — ABNORMAL LOW
EGFR (Non-African Amer.): 34 — ABNORMAL LOW
Glucose: 106 mg/dL — ABNORMAL HIGH (ref 65–99)
Potassium: 3.7 mmol/L (ref 3.5–5.1)
Sodium: 141 mmol/L (ref 136–145)

## 2013-03-03 LAB — CBC WITH DIFFERENTIAL/PLATELET
Basophil #: 0 10*3/uL (ref 0.0–0.1)
Eosinophil #: 0 10*3/uL (ref 0.0–0.7)
HCT: 36 % — ABNORMAL LOW (ref 40.0–52.0)
HGB: 12.4 g/dL — ABNORMAL LOW (ref 13.0–18.0)
Lymphocyte #: 1.6 10*3/uL (ref 1.0–3.6)
MCH: 32.8 pg (ref 26.0–34.0)
MCHC: 34.4 g/dL (ref 32.0–36.0)
MCV: 95 fL (ref 80–100)
Monocyte %: 9.3 %
Neutrophil #: 10.6 10*3/uL — ABNORMAL HIGH (ref 1.4–6.5)
Neutrophil %: 78.2 %
Platelet: 126 10*3/uL — ABNORMAL LOW (ref 150–440)
WBC: 13.6 10*3/uL — ABNORMAL HIGH (ref 3.8–10.6)

## 2013-03-03 LAB — URINE CULTURE

## 2013-03-04 LAB — BASIC METABOLIC PANEL
Anion Gap: 6 — ABNORMAL LOW (ref 7–16)
Chloride: 106 mmol/L (ref 98–107)
Co2: 28 mmol/L (ref 21–32)
Potassium: 4 mmol/L (ref 3.5–5.1)

## 2013-03-05 LAB — BASIC METABOLIC PANEL
BUN: 26 mg/dL — ABNORMAL HIGH (ref 7–18)
Calcium, Total: 8.7 mg/dL (ref 8.5–10.1)
Chloride: 106 mmol/L (ref 98–107)
Creatinine: 1.31 mg/dL — ABNORMAL HIGH (ref 0.60–1.30)
EGFR (African American): 58 — ABNORMAL LOW
EGFR (Non-African Amer.): 50 — ABNORMAL LOW
Glucose: 234 mg/dL — ABNORMAL HIGH (ref 65–99)
Osmolality: 290 (ref 275–301)

## 2013-03-05 LAB — CBC WITH DIFFERENTIAL/PLATELET
Eosinophil #: 0.1 10*3/uL (ref 0.0–0.7)
Eosinophil %: 1.8 %
HCT: 34.5 % — ABNORMAL LOW (ref 40.0–52.0)
Lymphocyte #: 1.9 10*3/uL (ref 1.0–3.6)
MCH: 32.4 pg (ref 26.0–34.0)
MCHC: 34.2 g/dL (ref 32.0–36.0)
MCV: 95 fL (ref 80–100)
Monocyte #: 0.7 x10 3/mm (ref 0.2–1.0)
Monocyte %: 8.3 %
Neutrophil #: 5.3 10*3/uL (ref 1.4–6.5)
Neutrophil %: 65.3 %
Platelet: 110 10*3/uL — ABNORMAL LOW (ref 150–440)
RDW: 14.6 % — ABNORMAL HIGH (ref 11.5–14.5)

## 2013-03-05 LAB — HEMOGLOBIN A1C: Hemoglobin A1C: 7.6 % — ABNORMAL HIGH (ref 4.2–6.3)

## 2013-03-06 LAB — BASIC METABOLIC PANEL WITH GFR
Anion Gap: 8
BUN: 22 mg/dL — ABNORMAL HIGH
Calcium, Total: 8.7 mg/dL
Chloride: 105 mmol/L
Co2: 27 mmol/L
Creatinine: 1.29 mg/dL
EGFR (African American): 59 — ABNORMAL LOW
EGFR (Non-African Amer.): 51 — ABNORMAL LOW
Glucose: 237 mg/dL — ABNORMAL HIGH
Osmolality: 290
Potassium: 4 mmol/L
Sodium: 140 mmol/L

## 2013-03-08 LAB — CULTURE, BLOOD (SINGLE)

## 2013-03-13 ENCOUNTER — Encounter: Payer: Medicare Other | Admitting: *Deleted

## 2013-03-24 ENCOUNTER — Encounter: Payer: Self-pay | Admitting: *Deleted

## 2013-04-12 ENCOUNTER — Other Ambulatory Visit: Payer: Self-pay | Admitting: Internal Medicine

## 2013-04-17 ENCOUNTER — Ambulatory Visit (INDEPENDENT_AMBULATORY_CARE_PROVIDER_SITE_OTHER): Payer: Medicare Other | Admitting: Internal Medicine

## 2013-04-17 ENCOUNTER — Encounter: Payer: Self-pay | Admitting: Internal Medicine

## 2013-04-17 VITALS — BP 110/60 | HR 97 | Temp 98.2°F | Wt 121.0 lb

## 2013-04-17 DIAGNOSIS — N139 Obstructive and reflux uropathy, unspecified: Secondary | ICD-10-CM

## 2013-04-17 DIAGNOSIS — N401 Enlarged prostate with lower urinary tract symptoms: Secondary | ICD-10-CM

## 2013-04-17 DIAGNOSIS — I2510 Atherosclerotic heart disease of native coronary artery without angina pectoris: Secondary | ICD-10-CM

## 2013-04-17 DIAGNOSIS — E1129 Type 2 diabetes mellitus with other diabetic kidney complication: Secondary | ICD-10-CM

## 2013-04-17 DIAGNOSIS — I10 Essential (primary) hypertension: Secondary | ICD-10-CM

## 2013-04-17 DIAGNOSIS — F329 Major depressive disorder, single episode, unspecified: Secondary | ICD-10-CM

## 2013-04-17 MED ORDER — CITALOPRAM HYDROBROMIDE 10 MG PO TABS: 10.0000 mg | ORAL_TABLET | Freq: Every day | ORAL | Status: DC

## 2013-04-17 NOTE — Assessment & Plan Note (Signed)
Has been controlled on the glipizide

## 2013-04-17 NOTE — Assessment & Plan Note (Signed)
BP Readings from Last 3 Encounters:  04/17/13 110/60  11/14/12 148/80  10/03/12 126/60   Low now Will continue meds for his heart

## 2013-04-17 NOTE — Assessment & Plan Note (Signed)
No active symptoms

## 2013-04-17 NOTE — Assessment & Plan Note (Signed)
Now needs indwelling Foley chronically Will get home health to do changes Stop tamsulosin  Not sure he still needs finasteride either---will leave to Dr Evelene Croon

## 2013-04-17 NOTE — Assessment & Plan Note (Signed)
Depressed with flat affect Will restart his citalopram

## 2013-04-17 NOTE — Progress Notes (Signed)
Subjective:    Patient ID: Andres [name], male    DOB: 1926/09/17, 79 y.o.   MRN: [id number]  HPI Son is with him  Was admitted to [facility] in April for urinary retention Foley placed Seen by Dr Evelene Croon Decided to stick with indwelling Foley Needs home health for monthly changes  Still with anxiety Nerves are an issue at times Feels bad--"not being able to control everything" Clearly anhedonic----son "fusses at him" just to try to get him to do anything  Hasn't been checking sugars Was fine in the hospital No hypoglycemic reactions  Current Outpatient Prescriptions on File Prior to Visit  Medication Sig Dispense Refill  . aspirin 81 MG tablet Take 81 mg by mouth daily.        Marland Kitchen atorvastatin (LIPITOR) 10 MG tablet Take 1 tablet (10 mg total) by mouth daily.  90 tablet  3  . clopidogrel (PLAVIX) 75 MG tablet Take 1 tablet (75 mg total) by mouth daily.  90 tablet  3  . feeding supplement (BOOST HIGH PROTEIN) LIQD Take 1 Container by mouth daily.      . finasteride (PROSCAR) 5 MG tablet Take 1 tablet (5 mg total) by mouth daily.  90 tablet  3  . glipiZIDE (GLIPIZIDE XL) 2.5 MG 24 hr tablet Take 1 tablet (2.5 mg total) by mouth daily.  90 tablet  3  . isosorbide mononitrate (IMDUR) 30 MG 24 hr tablet Take 1 tablet (30 mg total) by mouth daily.  90 tablet  3  . LIFESCAN FINEPOINT LANCETS MISC USE AS DIRECTED ONCE DAILY  100 each  10  . metFORMIN (GLUCOPHAGE) 1000 MG tablet Take one-half tablet by  mouth twice a day with  meals  90 tablet  3  . metoprolol tartrate (LOPRESSOR) 25 MG tablet Take 1 tablet (25 mg total) by mouth 2 (two) times daily.  180 tablet  3  . nitroGLYCERIN (NITROSTAT) 0.4 MG SL tablet Place 0.4 mg under the tongue every 5 (five) minutes as needed.      . ONE TOUCH ULTRA TEST test strip TEST AS DIRECTED EVERY DAY  100 each  10  . Tamsulosin HCl (FLOMAX) 0.4 MG CAPS Take 1 capsule (0.4 mg total) by mouth daily.  90 capsule  3   No current facility-administered  medications on file prior to visit.    No Known Allergies  Past Medical History  Diagnosis Date  . CAD (coronary artery disease)   . Diverticulitis   . GERD (gastroesophageal reflux disease)   . Hyperlipidemia   . Hypertension   . BPH (benign prostatic hypertrophy)   . Diabetes mellitus   . DJD (degenerative joint disease)     lumbar spine  . IBS (irritable bowel syndrome)   . Stroke   . Depression 2012  . Acute MI 09/2012  . Chronic kidney disease     ACUTE RENAL FAILURE    Past Surgical History  Procedure Laterality Date  . Angioplasty  1995  . Doppler echocardiography  11/1999    syncope  . Pacemaker insertion      Pacer/cath  (ef 65%)  1/01 -  guidant Discovery II DR pulse generator  . Prostate surgery  07/2003  . Esophagogastroduodenoscopy  07/2005    neg  . Cardiac catheterization  09/2012    NSTEMI/STENT PLACED  . Insert / replace / remove pacemaker      [id number]    Family History  Problem Relation Age of Onset  . Diabetes Sister  History   Social History  . Marital Status: Widowed    Spouse Name: N/A    Number of Children: 5  . Years of Education: N/A   Occupational History  . 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 Smoking status: Former Games developer  . Smokeless tobacco: Never Used  . Alcohol Use: No     Comment: heavy in the past  . Drug Use: Not on file  . Sexually Active: Not on file   Other Topics Concern  . Not on file   Social History Narrative   Lives with son and DIL   Review of Systems Has lost another 8#---having trouble with his dentures so trouble eating Doesn't like soft foods--only eats eggs, pancakes and oatmeal No dizziness    Objective:   Physical Exam  Constitutional: No distress.  Looks mildly wasted  Neck: Normal range of motion. Neck supple. No thyromegaly present.  Cardiovascular: Normal rate, regular rhythm and normal heart sounds.  Exam reveals no gallop.   No murmur  heard. Pulmonary/Chest: Effort normal and breath sounds normal. No respiratory distress. He has no wheezes. He has no rales.  Abdominal: Soft. There is no tenderness.  Musculoskeletal: He exhibits no edema and no tenderness.  Lymphadenopathy:    He has no cervical adenopathy.  Psychiatric:  Seems depressed Flattened affect          Assessment & Plan:

## 2013-04-17 NOTE — Patient Instructions (Signed)
Please stop the tamsulosin Please restart the citalopram at 10mg  daily

## 2013-04-18 ENCOUNTER — Telehealth: Payer: Self-pay

## 2013-04-18 MED ORDER — FINASTERIDE 5 MG PO TABS: 5.0000 mg | ORAL_TABLET | Freq: Every day | ORAL | Status: DC

## 2013-04-18 MED ORDER — CLOPIDOGREL BISULFATE 75 MG PO TABS: 75.0000 mg | ORAL_TABLET | Freq: Every day | ORAL | Status: DC

## 2013-04-18 MED ORDER — METOPROLOL TARTRATE 25 MG PO TABS: 25.0000 mg | ORAL_TABLET | Freq: Two times a day (BID) | ORAL | Status: DC

## 2013-04-18 MED ORDER — ATORVASTATIN CALCIUM 10 MG PO TABS: 10.0000 mg | ORAL_TABLET | Freq: Every day | ORAL | Status: DC

## 2013-04-18 MED ORDER — GLIPIZIDE ER 2.5 MG PO TB24: 2.5000 mg | ORAL_TABLET | Freq: Every day | ORAL | Status: DC

## 2013-04-18 MED ORDER — METFORMIN HCL 1000 MG PO TABS: 500.0000 mg | ORAL_TABLET | Freq: Two times a day (BID) | ORAL | Status: DC

## 2013-04-18 MED ORDER — ISOSORBIDE MONONITRATE ER 30 MG PO TB24: 30.0000 mg | ORAL_TABLET | Freq: Every day | ORAL | Status: DC

## 2013-04-18 NOTE — Telephone Encounter (Signed)
Pt's son Andres [name] said cannot find any of pt's medicines except a partial bottle of his depression pill. Pt was seen on 04/17/13 with Dr Alphonsus Sias. Andres [name] said when pt went to hospital in ambulance approx 02/02/13 has never gotten medicine back. Andres [name] said he has spoken with hospital but no one knows where pt's meds are. Andres [name] said pt gives himself his own med so Andres [name] is not sure when he last took medication. Andres [name] said local pharmacy is [facility] and mail order is [facility]....Marland KitchenMarland KitchenPlease advise. Andres [name] request call back.

## 2013-04-18 NOTE — Telephone Encounter (Signed)
Spoke with son and reordered all medications, son will still try and find meds but wanted a 30day supply until they find out whats going on. rx sent to pharmacy by e-script

## 2013-04-19 NOTE — Telephone Encounter (Signed)
Okay, thanks

## 2013-04-20 ENCOUNTER — Telehealth: Payer: Self-pay | Admitting: Internal Medicine

## 2013-04-20 MED ORDER — ONETOUCH DELICA LANCETS 33G MISC: 1.0000 | Freq: Every day | Status: DC

## 2013-04-20 NOTE — Telephone Encounter (Signed)
Okay for speech therapy but no MBSS (modified barium swallow study) Okay to order lancets

## 2013-04-20 NOTE — Telephone Encounter (Signed)
Nurse is going to be placing a new 16 french foley today.  Patient has a pacemaker that she can't see the last time it was checked.  Nurse will check with Dr.Klein about making an appointment.  Nurse is requesting speech therapy.  Patient isn't eating and drinking well. His blood sugar was 299.  Patient doesn't have the right lancets.  Patient needs a rx for one touch delica lancets-[facility].

## 2013-04-20 NOTE — Addendum Note (Signed)
Addended by: Sueanne Margarita on: 04/20/2013 02:34 PM   Modules accepted: Orders

## 2013-04-20 NOTE — Telephone Encounter (Signed)
Noted He has been having a hard time for years --and was actually on hospice before and then discharged for stability. Will see how things go

## 2013-04-20 NOTE — Telephone Encounter (Addendum)
Spoke with hospice nurse and advised results rx sent to pharmacy by e-script for lancets Per hospice nurse she doesn't think pt will last long at home, she spent 2 hours today at the home, pt's son did not want to learn how to change urine bag, the nurse has pt on a 24 hr bag, leg bag, per nurse pt's urine is real dark and she had difficulty inserting the 18 foley today, per nurse she will send notes.

## 2013-04-27 ENCOUNTER — Telehealth: Payer: Self-pay

## 2013-04-27 NOTE — Telephone Encounter (Signed)
Pam nurse with [facility] left v/m requesting order for home health aide twice a week starting next week; pt needs assistance with personal hygiene that is more than family can provide and social worker order for assessment and possible placement; pts son has decided not going to place pt in skilled facility.

## 2013-04-28 NOTE — Telephone Encounter (Signed)
Please give verbal order for home health aide twice a week starting next week; pt needs assistance with personal hygiene that is more than family can provide and social worker order for assessment and possible placement

## 2013-04-28 NOTE — Telephone Encounter (Signed)
I know he is not doing well See if his son can bring him in next week---instead of the July appt

## 2013-04-28 NOTE — Telephone Encounter (Signed)
Pam advised.  She states that she would like to talk to Dr. Alphonsus Sias when he returns.  This patient is not doing well, has lost another 8 pounds.  She did a formal depression test and a score of 0-5 is normal.  His score was 15.  She does not feel that the Citalopram is working at all.

## 2013-05-02 ENCOUNTER — Telehealth: Payer: Self-pay | Admitting: Internal Medicine

## 2013-05-02 NOTE — Telephone Encounter (Signed)
.  left message to have patient's son return my call.  

## 2013-05-02 NOTE — Telephone Encounter (Signed)
Form on your desk  

## 2013-05-02 NOTE — Telephone Encounter (Signed)
Dee, pt's son dropped off forms from the [place] to be completed for the pt.  I have placed them on your desk. Thank you!

## 2013-05-03 NOTE — Telephone Encounter (Signed)
I will need to fill these out at his appt July 7th

## 2013-05-03 NOTE — Telephone Encounter (Signed)
Spoke with patient's son and he rescheduled pt's appt sooner than 05/15/13, moved his appt to Wednesday 05/10/13, per son he needs the [place] paperwork soon.

## 2013-05-03 NOTE — Telephone Encounter (Signed)
okay

## 2013-05-04 ENCOUNTER — Other Ambulatory Visit: Payer: Self-pay | Admitting: Internal Medicine

## 2013-05-04 ENCOUNTER — Telehealth: Payer: Self-pay | Admitting: Internal Medicine

## 2013-05-04 NOTE — Telephone Encounter (Signed)
New Prob     Pts son has some questions related to his fathers remote checks and physical checks. Please call.

## 2013-05-04 NOTE — Telephone Encounter (Signed)
Spoke w/pt's son in regards to checks. Advised for pt to send transmission this week and then will need to be seen by Dr Graciela Husbands in May in 3 mths. Recall was put in Epic.

## 2013-05-05 ENCOUNTER — Other Ambulatory Visit: Payer: Self-pay | Admitting: *Deleted

## 2013-05-05 MED ORDER — METFORMIN HCL 1000 MG PO TABS: 500.0000 mg | ORAL_TABLET | Freq: Two times a day (BID) | ORAL | Status: DC

## 2013-05-10 ENCOUNTER — Ambulatory Visit (INDEPENDENT_AMBULATORY_CARE_PROVIDER_SITE_OTHER): Payer: Medicare Other | Admitting: Internal Medicine

## 2013-05-10 ENCOUNTER — Encounter: Payer: Self-pay | Admitting: Internal Medicine

## 2013-05-10 VITALS — BP 118/60 | HR 66 | Wt 126.0 lb

## 2013-05-10 DIAGNOSIS — F329 Major depressive disorder, single episode, unspecified: Secondary | ICD-10-CM

## 2013-05-10 DIAGNOSIS — N139 Obstructive and reflux uropathy, unspecified: Secondary | ICD-10-CM

## 2013-05-10 DIAGNOSIS — E1129 Type 2 diabetes mellitus with other diabetic kidney complication: Secondary | ICD-10-CM

## 2013-05-10 DIAGNOSIS — N401 Enlarged prostate with lower urinary tract symptoms: Secondary | ICD-10-CM

## 2013-05-10 DIAGNOSIS — I6990 Unspecified sequelae of unspecified cerebrovascular disease: Secondary | ICD-10-CM

## 2013-05-10 MED ORDER — FLUTICASONE PROPIONATE 50 MCG/ACT NA SUSP: 2.0000 | Freq: Every day | NASAL | Status: DC

## 2013-05-10 NOTE — Addendum Note (Signed)
Addended by: Sueanne Margarita on: 05/10/2013 12:03 PM   Modules accepted: Orders

## 2013-05-10 NOTE — Assessment & Plan Note (Signed)
Chronic indwelling foley now Has home health

## 2013-05-10 NOTE — Assessment & Plan Note (Signed)
Sugars are running high Will check A1c  Increase glipizide

## 2013-05-10 NOTE — Assessment & Plan Note (Signed)
Seems to be much better Will continue low dose---consider increase if effect wanes

## 2013-05-10 NOTE — Progress Notes (Signed)
Subjective:    Patient ID: Andres [name], male    DOB: [birth year], 79 y.o.   MRN: [id number]  HPI Here with son  Still not doing well Concerned about AM nasal congestion---hard to clean out  Sugars running high Checks in AM---over 300 yesterday--200's otherwise No hypoglycemic reactions Appetite is better  Son notices an improvement in mood Patient is less sure about an effect Walks around the house and on deck now Watches TV--enjoys this at times  Getting used to the Foley Has leg bag --but not using this  He handles the emptying of the bag Nurses come once a week for now  Reviewed his care needs Hired a sitter 4 days per week -- 6 hours per day This has allowed son to go back to work  Current Outpatient Prescriptions on File Prior to Visit  Medication Sig Dispense Refill  . aspirin 81 MG tablet Take 81 mg by mouth daily.        Marland Kitchen atorvastatin (LIPITOR) 10 MG tablet Take 1 tablet (10 mg total) by mouth daily.  30 tablet  0  . citalopram (CELEXA) 10 MG tablet Take 1 tablet (10 mg total) by mouth daily.  30 tablet  11  . clopidogrel (PLAVIX) 75 MG tablet Take 1 tablet (75 mg total) by mouth daily.  30 tablet  0  . feeding supplement (BOOST HIGH PROTEIN) LIQD Take 1 Container by mouth daily.      . finasteride (PROSCAR) 5 MG tablet Take 1 tablet (5 mg total) by mouth daily.  30 tablet  0  . glipiZIDE (GLIPIZIDE XL) 2.5 MG 24 hr tablet Take 1 tablet (2.5 mg total) by mouth daily.  30 tablet  0  . isosorbide mononitrate (IMDUR) 30 MG 24 hr tablet Take 1 tablet (30 mg total) by mouth daily.  30 tablet  0  . metFORMIN (GLUCOPHAGE) 1000 MG tablet Take 0.5 tablets (500 mg total) by mouth 2 (two) times daily with a meal.  180 tablet  1  . metoprolol tartrate (LOPRESSOR) 25 MG tablet Take 1 tablet (25 mg total) by mouth 2 (two) times daily.  60 tablet  0  . nitroGLYCERIN (NITROSTAT) 0.4 MG SL tablet Place 0.4 mg under the tongue every 5 (five) minutes as needed.      . ONE TOUCH  ULTRA TEST test strip TEST AS DIRECTED EVERY DAY  100 each  10  . ONETOUCH DELICA LANCETS 33G MISC Inject 1 Device into the skin daily. Use as directed once daily  100 each  1   No current facility-administered medications on file prior to visit.    No Known Allergies  Past Medical History  Diagnosis Date  . CAD (coronary artery disease)   . Diverticulitis   . GERD (gastroesophageal reflux disease)   . Hyperlipidemia   . Hypertension   . BPH (benign prostatic hypertrophy)   . Diabetes mellitus   . DJD (degenerative joint disease)     lumbar spine  . IBS (irritable bowel syndrome)   . Stroke   . Depression 2012  . Acute MI 09/2012  . Chronic kidney disease     ACUTE RENAL FAILURE    Past Surgical History  Procedure Laterality Date  . Angioplasty  1995  . Doppler echocardiography  11/1999    syncope  . Pacemaker insertion      Pacer/cath  (ef 65%)  1/01 -  guidant Discovery II DR pulse generator  . Prostate surgery  07/2003  . Esophagogastroduodenoscopy  07/2005    neg  . Cardiac catheterization  09/2012    NSTEMI/STENT PLACED  . Insert / replace / remove pacemaker      [id number]    Family History  Problem Relation Age of Onset  . Diabetes Sister     History   Social History  . Marital Status: Widowed    Spouse Name: N/A    Number of Children: 5  . Years of Education: N/A   Occupational History  . 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 Smoking status: Former Games developer  . Smokeless tobacco: Never Used  . Alcohol Use: No     Comment: heavy in the past  . Drug Use: Not on file  . Sexually Active: Not on file   Other Topics Concern  . Not on file   Social History Narrative   Lives with son and DIL   Review of Systems Weight is up 5# Sleeps well    Objective:   Physical Exam  Constitutional: He appears well-developed. No distress.  Still gaunt but looks better  Neck: Normal range of motion. No thyromegaly  present.  Cardiovascular: Normal rate, regular rhythm and normal heart sounds.  Exam reveals no gallop.   No murmur heard. Pulmonary/Chest: Effort normal and breath sounds normal. No respiratory distress. He has no wheezes. He has no rales.  Musculoskeletal: He exhibits no edema.  Lymphadenopathy:    He has no cervical adenopathy.  Psychiatric:  Brighter, more alert and interactive          Assessment & Plan:

## 2013-05-10 NOTE — Assessment & Plan Note (Signed)
Uses walker Needs daily aide for help with ADLs [facility] form done

## 2013-05-15 ENCOUNTER — Ambulatory Visit: Payer: Medicare Other | Admitting: Internal Medicine

## 2013-05-18 ENCOUNTER — Telehealth: Payer: Self-pay

## 2013-05-18 NOTE — Telephone Encounter (Signed)
Pts son said he went to meeting at [place] and the [place] rep did not know abbreviations in question 10 and said pt had to have dx to receive [facility] help. I called Saverio Danker at [phone] and spelled the diagnosis for Tammy so when [name] goes back for next meeting she will understand info.[name] notified done.

## 2013-06-20 DIAGNOSIS — N179 Acute kidney failure, unspecified: Secondary | ICD-10-CM

## 2013-06-20 DIAGNOSIS — E119 Type 2 diabetes mellitus without complications: Secondary | ICD-10-CM

## 2013-06-20 DIAGNOSIS — R339 Retention of urine, unspecified: Secondary | ICD-10-CM

## 2013-06-20 DIAGNOSIS — Z466 Encounter for fitting and adjustment of urinary device: Secondary | ICD-10-CM

## 2013-07-14 ENCOUNTER — Encounter: Payer: Self-pay | Admitting: Internal Medicine

## 2013-07-14 ENCOUNTER — Ambulatory Visit (INDEPENDENT_AMBULATORY_CARE_PROVIDER_SITE_OTHER): Payer: Medicare Other | Admitting: Internal Medicine

## 2013-07-14 VITALS — BP 120/50 | HR 70 | Temp 97.6°F | Wt 127.0 lb

## 2013-07-14 DIAGNOSIS — E1129 Type 2 diabetes mellitus with other diabetic kidney complication: Secondary | ICD-10-CM

## 2013-07-14 DIAGNOSIS — F329 Major depressive disorder, single episode, unspecified: Secondary | ICD-10-CM

## 2013-07-14 DIAGNOSIS — I2510 Atherosclerotic heart disease of native coronary artery without angina pectoris: Secondary | ICD-10-CM

## 2013-07-14 DIAGNOSIS — I10 Essential (primary) hypertension: Secondary | ICD-10-CM

## 2013-07-14 DIAGNOSIS — E785 Hyperlipidemia, unspecified: Secondary | ICD-10-CM

## 2013-07-14 DIAGNOSIS — F32A Depression, unspecified: Secondary | ICD-10-CM

## 2013-07-14 MED ORDER — GLIPIZIDE ER 5 MG PO TB24: 5.0000 mg | ORAL_TABLET | Freq: Every day | ORAL | Status: DC

## 2013-07-14 NOTE — Assessment & Plan Note (Signed)
BP Readings from Last 3 Encounters:  07/14/13 120/50  05/10/13 118/60  04/17/13 110/60   Good control Labs next time

## 2013-07-14 NOTE — Assessment & Plan Note (Signed)
Has been quiet 

## 2013-07-14 NOTE — Assessment & Plan Note (Signed)
Doubled glipizide Not checking sugars---asked to check occasionally A1c next time

## 2013-07-14 NOTE — Assessment & Plan Note (Signed)
Mood is much better Continue the citalopram

## 2013-07-14 NOTE — Progress Notes (Signed)
Subjective:    Patient ID: Andres [name], male    DOB: [birth year], 79 y.o.   MRN: [id number]  HPI Doing well Son really pleased with his progress  Getting along with the walker pretty well Sits outside on deck Has someone to help with bath 3 times per week Aide 6 hours per day, 4 days per week---son goes to work Mood is better overall---more motivated  Checks sugars rarely--- 258 after eating recently Hasn't checked fasting---he relies on someone else due to new machine No hypoglycemic reactions  No chest pain No SOB with his limited activity  Bowels moving okay Does have some fecal incontinence  Still has the Foley Doing okay with this  Current Outpatient Prescriptions on File Prior to Visit  Medication Sig Dispense Refill  . aspirin 81 MG tablet Take 81 mg by mouth daily.        Marland Kitchen atorvastatin (LIPITOR) 10 MG tablet Take 1 tablet (10 mg total) by mouth daily.  30 tablet  0  . citalopram (CELEXA) 10 MG tablet Take 1 tablet (10 mg total) by mouth daily.  30 tablet  11  . clopidogrel (PLAVIX) 75 MG tablet Take 1 tablet (75 mg total) by mouth daily.  30 tablet  0  . docusate sodium (COLACE) 100 MG capsule Take 100 mg by mouth 2 (two) times daily.      . feeding supplement (BOOST HIGH PROTEIN) LIQD Take 1 Container by mouth daily.      . finasteride (PROSCAR) 5 MG tablet Take 1 tablet (5 mg total) by mouth daily.  30 tablet  0  . fluticasone (FLONASE) 50 MCG/ACT nasal spray Place 2 sprays into the nose daily.  16 g  1  . isosorbide mononitrate (IMDUR) 30 MG 24 hr tablet Take 1 tablet (30 mg total) by mouth daily.  30 tablet  0  . metFORMIN (GLUCOPHAGE) 1000 MG tablet Take 0.5 tablets (500 mg total) by mouth 2 (two) times daily with a meal.  180 tablet  1  . metoprolol tartrate (LOPRESSOR) 25 MG tablet Take 1 tablet (25 mg total) by mouth 2 (two) times daily.  60 tablet  0  . nitroGLYCERIN (NITROSTAT) 0.4 MG SL tablet Place 0.4 mg under the tongue every 5 (five) minutes as  needed.      . ONE TOUCH ULTRA TEST test strip TEST AS DIRECTED EVERY DAY  100 each  10  . ONETOUCH DELICA LANCETS 33G MISC Inject 1 Device into the skin daily. Use as directed once daily  100 each  1   No current facility-administered medications on file prior to visit.    No Known Allergies  Past Medical History  Diagnosis Date  . CAD (coronary artery disease)   . Diverticulitis   . GERD (gastroesophageal reflux disease)   . Hyperlipidemia   . Hypertension   . BPH (benign prostatic hypertrophy)   . Diabetes mellitus   . DJD (degenerative joint disease)     lumbar spine  . IBS (irritable bowel syndrome)   . Stroke   . Depression 2012  . Acute MI 09/2012  . Chronic kidney disease     ACUTE RENAL FAILURE    Past Surgical History  Procedure Laterality Date  . Angioplasty  1995  . Doppler echocardiography  11/1999    syncope  . Pacemaker insertion      Pacer/cath  (ef 65%)  1/01 -  guidant Discovery II DR pulse generator  . Prostate surgery  07/2003  .  Esophagogastroduodenoscopy  07/2005    neg  . Cardiac catheterization  09/2012    NSTEMI/STENT PLACED  . Insert / replace / remove pacemaker      [id number]    Family History  Problem Relation Age of Onset  . Diabetes Sister     History   Social History  . Marital Status: Widowed    Spouse Name: N/A    Number of Children: 5  . Years of Education: N/A   Occupational History  . 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 Smoking status: Former Games developer  . Smokeless tobacco: Never Used  . Alcohol Use: No     Comment: heavy in the past  . Drug Use: Not on file  . Sexual Activity: Not on file   Other Topics Concern  . Not on file   Social History Narrative   Lives with son and DIL   Review of Systems Appetite is better Weight is up 1# Sleeps okay--- a few hours at a time but enough overall    Objective:   Physical Exam  Constitutional: He appears well-developed and  well-nourished. No distress.  Neck: Normal range of motion. No thyromegaly present.  Cardiovascular: Normal rate, regular rhythm and normal heart sounds.  Exam reveals no gallop.   No murmur heard. Pulmonary/Chest: Effort normal and breath sounds normal. No respiratory distress. He has no wheezes. He has no rales.  Abdominal: Soft. There is no tenderness.  Genitourinary:  Foley in  Musculoskeletal: He exhibits no edema and no tenderness.  Lymphadenopathy:    He has no cervical adenopathy.  Psychiatric:  Alert, bright eyed Engages with easy and fluent speech!          Assessment & Plan:

## 2013-07-14 NOTE — Assessment & Plan Note (Signed)
No problems with statin Lab Results  Component Value Date   LDLCALC 54 11/14/2012

## 2013-07-21 ENCOUNTER — Telehealth: Payer: Self-pay | Admitting: *Deleted

## 2013-07-21 NOTE — Telephone Encounter (Signed)
Nurse calling stating she changed pt's cathter on Monday and now pt is complaing of bladder pain, per the nurse there is no blockage and the urine in flowing, she thinks maybe the pt has a UTI and would like his urine checked. Please advise

## 2013-07-21 NOTE — Telephone Encounter (Signed)
Spoke with nurse and advised results, she will send for UA and culture.  .left message to have patient return my call.

## 2013-07-21 NOTE — Telephone Encounter (Signed)
The urine will always be abnormal with a catheter. Please call him to see what is going on If he is really having dysuria, let the nurse know to check a urinalysis and culture

## 2013-08-16 ENCOUNTER — Telehealth: Payer: Self-pay | Admitting: *Deleted

## 2013-08-16 NOTE — Telephone Encounter (Signed)
Spoke with Efton Thomley (son), patient has appointment in November with Dr. Graciela Husbands.  We will set up remotes at that appointment.

## 2013-08-29 DIAGNOSIS — E119 Type 2 diabetes mellitus without complications: Secondary | ICD-10-CM

## 2013-08-29 DIAGNOSIS — N179 Acute kidney failure, unspecified: Secondary | ICD-10-CM

## 2013-08-29 DIAGNOSIS — Z466 Encounter for fitting and adjustment of urinary device: Secondary | ICD-10-CM

## 2013-08-29 DIAGNOSIS — R339 Retention of urine, unspecified: Secondary | ICD-10-CM

## 2013-09-11 ENCOUNTER — Encounter: Payer: Medicare Other | Admitting: Internal Medicine

## 2013-09-11 ENCOUNTER — Encounter: Payer: Self-pay | Admitting: *Deleted

## 2013-09-29 ENCOUNTER — Ambulatory Visit (INDEPENDENT_AMBULATORY_CARE_PROVIDER_SITE_OTHER): Payer: Medicare Other | Admitting: Internal Medicine

## 2013-09-29 ENCOUNTER — Encounter: Payer: Self-pay | Admitting: Internal Medicine

## 2013-09-29 VITALS — BP 174/107 | HR 62 | Ht 68.0 in | Wt 123.8 lb

## 2013-09-29 DIAGNOSIS — R0602 Shortness of breath: Secondary | ICD-10-CM

## 2013-09-29 DIAGNOSIS — Z950 Presence of cardiac pacemaker: Secondary | ICD-10-CM

## 2013-09-29 DIAGNOSIS — I442 Atrioventricular block, complete: Secondary | ICD-10-CM

## 2013-09-29 LAB — MDC_IDC_ENUM_SESS_TYPE_INCLINIC
Battery Remaining Longevity: 89 mo
Brady Statistic RV Percent Paced: 99.8 %
Lead Channel Pacing Threshold Amplitude: 0.75 V
Lead Channel Pacing Threshold Pulse Width: 0.4 ms
Lead Channel Setting Pacing Amplitude: 1.5 V
Lead Channel Setting Pacing Amplitude: 2 V
Lead Channel Setting Pacing Pulse Width: 0.4 ms
Lead Channel Setting Sensing Sensitivity: 2.8 mV

## 2013-09-29 NOTE — Assessment & Plan Note (Signed)
The patient's device was interrogated.  The information was reviewed. No changes were made in the programming.    

## 2013-09-29 NOTE — Patient Instructions (Signed)
Your physician recommends that you continue on your current medications as directed. Please refer to the Current Medication list given to you today.  Remote monitoring is used to monitor your Pacemaker of ICD from home. This monitoring reduces the number of office visits required to check your device to one time per year. It allows us to keep an eye on the functioning of your device to ensure it is working properly. You are scheduled for a device check from home on 01/01/2014. You may send your transmission at any time that day. If you have a wireless device, the transmission will be sent automatically. After your physician reviews your transmission, you will receive a postcard with your next transmission date.  Your physician wants you to follow-up in: one year with Dr. Klein.  You will receive a reminder letter in the mail two months in advance. If you don't receive a letter, please call our office to schedule the follow-up appointment.    

## 2013-09-29 NOTE — Assessment & Plan Note (Signed)
Stable post pacing 

## 2013-09-29 NOTE — Progress Notes (Signed)
Patient Care Team: Karie Schwalbe, MD as PCP - General   HPI  Andres [name] is a 79 y.o. male  Seen infollowup for complete heart block for which he underwent pacemaker implantation.  e has a history of ischemic heart disease documented by catheterization in 2001 that was described as advanced. Ejection fraction at that time was normal.    The patient denies SOB, chest pain, edema or palpitations.  his biggest concern remains his balance. This continues to be progressive.  Just doesn't feel well but without specific complaints    Past Medical History  Diagnosis Date  . CAD (coronary artery disease)   . Diverticulitis   . GERD (gastroesophageal reflux disease)   . Hyperlipidemia   . Hypertension   . BPH (benign prostatic hypertrophy)   . Diabetes mellitus   . DJD (degenerative joint disease)     lumbar spine  . IBS (irritable bowel syndrome)   . Stroke   . Depression 2012  . Acute MI 09/2012  . Chronic kidney disease     ACUTE RENAL FAILURE    Past Surgical History  Procedure Laterality Date  . Angioplasty  1995  . Doppler echocardiography  11/1999    syncope  . Pacemaker insertion      Pacer/cath  (ef 65%)  1/01 -  guidant Discovery II DR pulse generator  . Prostate surgery  07/2003  . Esophagogastroduodenoscopy  07/2005    neg  . Cardiac catheterization  09/2012    NSTEMI/STENT PLACED  . Insert / replace / remove pacemaker      [id number]    Current Outpatient Prescriptions  Medication Sig Dispense Refill  . aspirin 81 MG tablet Take 81 mg by mouth daily.        Marland Kitchen atorvastatin (LIPITOR) 10 MG tablet Take 1 tablet (10 mg total) by mouth daily.  30 tablet  0  . citalopram (CELEXA) 10 MG tablet Take 1 tablet (10 mg total) by mouth daily.  30 tablet  11  . clopidogrel (PLAVIX) 75 MG tablet Take 1 tablet (75 mg total) by mouth daily.  30 tablet  0  . docusate sodium (COLACE) 100 MG capsule Take 100 mg by mouth 2 (two) times daily.      . feeding  supplement (BOOST HIGH PROTEIN) LIQD Take 1 Container by mouth daily.      . finasteride (PROSCAR) 5 MG tablet Take 1 tablet (5 mg total) by mouth daily.  30 tablet  0  . fluticasone (FLONASE) 50 MCG/ACT nasal spray Place 2 sprays into the nose daily.  16 g  1  . glipiZIDE (GLUCOTROL XL) 5 MG 24 hr tablet Take 1 tablet (5 mg total) by mouth daily.  90 tablet  3  . isosorbide mononitrate (IMDUR) 30 MG 24 hr tablet Take 1 tablet (30 mg total) by mouth daily.  30 tablet  0  . metFORMIN (GLUCOPHAGE) 1000 MG tablet Take 0.5 tablets (500 mg total) by mouth 2 (two) times daily with a meal.  180 tablet  1  . metoprolol tartrate (LOPRESSOR) 25 MG tablet Take 1 tablet (25 mg total) by mouth 2 (two) times daily.  60 tablet  0  . nitroGLYCERIN (NITROSTAT) 0.4 MG SL tablet Place 0.4 mg under the tongue every 5 (five) minutes as needed.      . ONE TOUCH ULTRA TEST test strip TEST AS DIRECTED EVERY DAY  100 each  10  . ONETOUCH DELICA LANCETS 33G MISC  Inject 1 Device into the skin daily. Use as directed once daily  100 each  1   No current facility-administered medications for this visit.    No Known Allergies  Review of Systems negative except from HPI and PMH  Physical Exam BP 174/107  Pulse 62  Ht 5\' 8"  (1.727 m)  Wt 123 lb 12 oz (56.133 kg)  BMI 18.82 kg/m2 Well developed and cachectic. malodorous HENT normal Neck supple with JVP-flat Clear Device pocket well healed; without hematoma or erythema.  There is no tethering  Regular rate and rhythm, no murmurs or gallops Abd-soft with active BS No Clubbing cyanosis edema Skin-warm and dry A & Oriented   y    Assessment and  Plan

## 2013-10-12 DIAGNOSIS — E119 Type 2 diabetes mellitus without complications: Secondary | ICD-10-CM

## 2013-10-12 DIAGNOSIS — N179 Acute kidney failure, unspecified: Secondary | ICD-10-CM

## 2013-10-12 DIAGNOSIS — Z466 Encounter for fitting and adjustment of urinary device: Secondary | ICD-10-CM

## 2013-10-12 DIAGNOSIS — R339 Retention of urine, unspecified: Secondary | ICD-10-CM

## 2013-10-16 ENCOUNTER — Other Ambulatory Visit: Payer: Self-pay | Admitting: Internal Medicine

## 2013-10-26 ENCOUNTER — Ambulatory Visit (INDEPENDENT_AMBULATORY_CARE_PROVIDER_SITE_OTHER): Payer: Medicare Other | Admitting: Internal Medicine

## 2013-10-26 ENCOUNTER — Encounter: Payer: Self-pay | Admitting: Internal Medicine

## 2013-10-26 VITALS — BP 130/78 | HR 62 | Temp 97.7°F | Wt 125.5 lb

## 2013-10-26 DIAGNOSIS — I6990 Unspecified sequelae of unspecified cerebrovascular disease: Secondary | ICD-10-CM

## 2013-10-26 DIAGNOSIS — N139 Obstructive and reflux uropathy, unspecified: Secondary | ICD-10-CM

## 2013-10-26 DIAGNOSIS — N138 Other obstructive and reflux uropathy: Secondary | ICD-10-CM

## 2013-10-26 DIAGNOSIS — N401 Enlarged prostate with lower urinary tract symptoms: Secondary | ICD-10-CM

## 2013-10-26 DIAGNOSIS — E1129 Type 2 diabetes mellitus with other diabetic kidney complication: Secondary | ICD-10-CM

## 2013-10-26 DIAGNOSIS — Z23 Encounter for immunization: Secondary | ICD-10-CM

## 2013-10-26 DIAGNOSIS — I10 Essential (primary) hypertension: Secondary | ICD-10-CM

## 2013-10-26 DIAGNOSIS — E785 Hyperlipidemia, unspecified: Secondary | ICD-10-CM

## 2013-10-26 DIAGNOSIS — N183 Chronic kidney disease, stage 3 unspecified: Secondary | ICD-10-CM

## 2013-10-26 LAB — HM DIABETES FOOT EXAM

## 2013-10-26 MED ORDER — TRIAMCINOLONE ACETONIDE 0.1 % EX CREA: 1.0000 "application " | TOPICAL_CREAM | Freq: Two times a day (BID) | CUTANEOUS | Status: AC | PRN

## 2013-10-26 NOTE — Assessment & Plan Note (Signed)
Chronic Foley now

## 2013-10-26 NOTE — Assessment & Plan Note (Signed)
No problems with the statin 

## 2013-10-26 NOTE — Assessment & Plan Note (Signed)
Has improved significantly Getting along okay with aide help Now with support via the [place]

## 2013-10-26 NOTE — Assessment & Plan Note (Signed)
Hopefully still acceptable Goal is under 9%

## 2013-10-26 NOTE — Patient Instructions (Signed)
Please set up your diabetic eye exam. 

## 2013-10-26 NOTE — Progress Notes (Signed)
Subjective:    Patient ID: Andres [name], male    DOB: [birth year], 79 y.o.   MRN: [id number]  HPI Here with son Still itchy scaling area behind left ear Has some seborrhea Was using aspercreme  Also troubled by drying in nose at night Gets congested  Still eating fairly well Weight up slightly Has aides come in while son is at work Walking with walker Gets assist with bathing and dressing  Hasn't been checking sugars Doesn't have aide in AM to check No apparent hypoglycemic reactions No foot pain or sores Overdue for eye exam  Still has the indwelling catheter Will be permanent Gets it changed monthly by nurse  Current Outpatient Prescriptions on File Prior to Visit  Medication Sig Dispense Refill  . aspirin 81 MG tablet Take 81 mg by mouth daily.        Marland Kitchen atorvastatin (LIPITOR) 10 MG tablet Take 1 tablet (10 mg total) by mouth daily.  30 tablet  0  . citalopram (CELEXA) 10 MG tablet Take 1 tablet (10 mg total) by mouth daily.  30 tablet  11  . clopidogrel (PLAVIX) 75 MG tablet Take 1 tablet (75 mg total) by mouth daily.  30 tablet  0  . docusate sodium (COLACE) 100 MG capsule Take 100 mg by mouth 2 (two) times daily.      . feeding supplement (BOOST HIGH PROTEIN) LIQD Take 1 Container by mouth daily.      . finasteride (PROSCAR) 5 MG tablet Take 1 tablet (5 mg total) by mouth daily.  30 tablet  0  . fluticasone (FLONASE) 50 MCG/ACT nasal spray Place 2 sprays into the nose daily.  16 g  1  . glipiZIDE (GLUCOTROL XL) 5 MG 24 hr tablet Take 1 tablet (5 mg total) by mouth daily.  90 tablet  3  . isosorbide mononitrate (IMDUR) 30 MG 24 hr tablet Take 1 tablet (30 mg total) by mouth daily.  30 tablet  0  . metFORMIN (GLUCOPHAGE) 1000 MG tablet Take 0.5 tablets (500 mg total) by mouth 2 (two) times daily with a meal.  180 tablet  1  . metoprolol tartrate (LOPRESSOR) 25 MG tablet Take 1 tablet (25 mg total) by mouth 2 (two) times daily.  60 tablet  0  . nitroGLYCERIN  (NITROSTAT) 0.4 MG SL tablet Place 0.4 mg under the tongue every 5 (five) minutes as needed.      . ONE TOUCH ULTRA TEST test strip TEST AS DIRECTED EVERY DAY  100 each  10  . ONETOUCH DELICA LANCETS 33G MISC Inject 1 Device into the skin daily. Use as directed once daily  100 each  1   No current facility-administered medications on file prior to visit.    No Known Allergies  Past Medical History  Diagnosis Date  . CAD (coronary artery disease)   . Diverticulitis   . GERD (gastroesophageal reflux disease)   . Hyperlipidemia   . Hypertension   . BPH (benign prostatic hypertrophy)   . Diabetes mellitus   . DJD (degenerative joint disease)     lumbar spine  . IBS (irritable bowel syndrome)   . Stroke   . Depression 2012  . Acute MI 09/2012  . Chronic kidney disease     ACUTE RENAL FAILURE    Past Surgical History  Procedure Laterality Date  . Angioplasty  1995  . Doppler echocardiography  11/1999    syncope  . Pacemaker insertion      Pacer/cath  (  ef 65%)  1/01 -  guidant Discovery II DR pulse generator  . Prostate surgery  07/2003  . Esophagogastroduodenoscopy  07/2005    neg  . Cardiac catheterization  09/2012    NSTEMI/STENT PLACED  . Insert / replace / remove pacemaker      [id number]    Family History  Problem Relation Age of Onset  . Diabetes Sister     History   Social History  . Marital Status: Widowed    Spouse Name: N/A    Number of Children: 5  . Years of Education: N/A   Occupational History  . 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 Smoking status: Former Games developer  . Smokeless tobacco: Never Used  . Alcohol Use: No     Comment: heavy in the past  . Drug Use: No  . Sexual Activity: Not on file   Other Topics Concern  . Not on file   Social History Narrative   Lives with son and DIL   Review of Systems Gets agitated with son at times---thinks he is trying to boss him around Bowels are still an  issue--tries to stay on cammode to go--- mostly incontinent though    Objective:   Physical Exam        Assessment & Plan:

## 2013-10-26 NOTE — Assessment & Plan Note (Signed)
BP Readings from Last 3 Encounters:  10/26/13 130/78  09/29/13 174/107  07/14/13 120/50   Good control No changes needed

## 2013-10-26 NOTE — Assessment & Plan Note (Signed)
Due for blood work again No uremic symptoms

## 2013-10-26 NOTE — Progress Notes (Signed)
Pre-visit discussion using our clinic review tool. No additional management support is needed unless otherwise documented below in the visit note.  

## 2013-10-26 NOTE — Addendum Note (Signed)
Addended by: Annamarie Major on: 10/26/2013 06:15 PM   Modules accepted: Orders

## 2013-10-27 LAB — CBC WITH DIFFERENTIAL/PLATELET
Basophils Relative: 0.6 % (ref 0.0–3.0)
Eosinophils Relative: 3.4 % (ref 0.0–5.0)
Hemoglobin: 12.9 g/dL — ABNORMAL LOW (ref 13.0–17.0)
Lymphocytes Relative: 29.3 % (ref 12.0–46.0)
MCV: 94.9 fl (ref 78.0–100.0)
Monocytes Absolute: 0.6 10*3/uL (ref 0.1–1.0)
Neutro Abs: 5.5 10*3/uL (ref 1.4–7.7)
Neutrophils Relative %: 59.7 % (ref 43.0–77.0)
Platelets: 168 10*3/uL (ref 150.0–400.0)
WBC: 9.2 10*3/uL (ref 4.5–10.5)

## 2013-10-27 LAB — BASIC METABOLIC PANEL
BUN: 19 mg/dL (ref 6–23)
CO2: 28 mEq/L (ref 19–32)
Calcium: 9.8 mg/dL (ref 8.4–10.5)
Creatinine, Ser: 1.4 mg/dL (ref 0.4–1.5)
GFR: 52.86 mL/min — ABNORMAL LOW (ref >60.00)
Sodium: 139 mEq/L (ref 135–145)

## 2013-10-27 LAB — LIPID PANEL
Cholesterol: 126 mg/dL (ref 0–200)
HDL: 31.4 mg/dL — ABNORMAL LOW (ref >39.00)
Total CHOL/HDL Ratio: 4
VLDL: 60.8 mg/dL — ABNORMAL HIGH (ref 0.0–40.0)

## 2013-10-27 LAB — HEPATIC FUNCTION PANEL
ALT: 12 U/L (ref 0–53)
AST: 21 U/L (ref 0–37)
Bilirubin, Direct: 0 mg/dL (ref 0.0–0.3)
Total Bilirubin: 0.4 mg/dL (ref 0.3–1.2)

## 2013-10-27 LAB — TSH: TSH: 2.76 u[IU]/mL (ref 0.35–5.50)

## 2013-10-27 LAB — MICROALBUMIN / CREATININE URINE RATIO
Creatinine,U: 130.1 mg/dL
Microalb Creat Ratio: 44.9 mg/g — ABNORMAL HIGH (ref 0.0–30.0)
Microalb, Ur: 58.4 mg/dL — ABNORMAL HIGH (ref 0.0–1.9)

## 2013-10-27 LAB — LDL CHOLESTEROL, DIRECT: Direct LDL: 65.6 mg/dL

## 2013-10-30 ENCOUNTER — Other Ambulatory Visit: Payer: Self-pay | Admitting: *Deleted

## 2013-10-30 ENCOUNTER — Encounter: Payer: Self-pay | Admitting: *Deleted

## 2013-10-30 MED ORDER — LISINOPRIL 5 MG PO TABS: 5.0000 mg | ORAL_TABLET | Freq: Every day | ORAL | Status: DC

## 2013-11-08 ENCOUNTER — Telehealth: Payer: Self-pay

## 2013-11-08 NOTE — Telephone Encounter (Signed)
Andres [name] called to verify lisinopril 5 mg taking one daily is correct just received from mail order pharmacy. Advised med list is lisinopril 5 mg taking one daily and reck lab in 30 days. Andres [name] will call back for lab appt.

## 2013-11-16 ENCOUNTER — Telehealth: Payer: Self-pay | Admitting: Internal Medicine

## 2013-11-16 NOTE — Telephone Encounter (Signed)
Pt's son [name]([name]) [phone] came by the office and dropped off [employer] forms to be filled out. I put them on Dee's Desk

## 2013-11-16 NOTE — Telephone Encounter (Signed)
Form on your desk I have also included the old form for reference

## 2013-11-17 DIAGNOSIS — Z0279 Encounter for issue of other medical certificate: Secondary | ICD-10-CM

## 2013-11-17 NOTE — Telephone Encounter (Signed)
Form done Call son for pick up or fax if he requests

## 2013-11-17 NOTE — Telephone Encounter (Signed)
I left a message on son's voice mail to pick up form and of $20 charge.

## 2013-11-22 LAB — HM DIABETES EYE EXAM

## 2013-12-06 ENCOUNTER — Encounter: Payer: Self-pay | Admitting: Internal Medicine

## 2013-12-06 ENCOUNTER — Ambulatory Visit (INDEPENDENT_AMBULATORY_CARE_PROVIDER_SITE_OTHER): Payer: Medicare Other | Admitting: Internal Medicine

## 2013-12-06 VITALS — BP 110/60 | HR 59 | Temp 98.0°F | Wt 121.0 lb

## 2013-12-06 DIAGNOSIS — N183 Chronic kidney disease, stage 3 unspecified: Secondary | ICD-10-CM

## 2013-12-06 LAB — BASIC METABOLIC PANEL
BUN: 20 mg/dL (ref 6–23)
CALCIUM: 9.9 mg/dL (ref 8.4–10.5)
CO2: 32 mEq/L (ref 19–32)
Chloride: 99 mEq/L (ref 96–112)
Creatinine, Ser: 1.4 mg/dL (ref 0.4–1.5)
GFR: 49.47 mL/min — ABNORMAL LOW (ref >60.00)
GLUCOSE: 259 mg/dL — AB (ref 70–99)
Potassium: 5.2 mEq/L — ABNORMAL HIGH (ref 3.5–5.1)
SODIUM: 139 meq/L (ref 135–145)

## 2013-12-06 NOTE — Progress Notes (Signed)
Subjective:    Patient ID: Andres [name], male    DOB: [birth year], 80 y.o.   MRN: [id number]  HPI Here with son  No problems with lisinopril No dizziness or syncope Has some mild throat symptoms---like something there he can't clear out Nose is congested also--may just be heat in house (not new in past month)  Current Outpatient Prescriptions on File Prior to Visit  Medication Sig Dispense Refill  . aspirin 81 MG tablet Take 81 mg by mouth daily.        Marland Kitchen. atorvastatin (LIPITOR) 10 MG tablet Take 1 tablet (10 mg total) by mouth daily.  30 tablet  0  . citalopram (CELEXA) 10 MG tablet Take 1 tablet (10 mg total) by mouth daily.  30 tablet  11  . clopidogrel (PLAVIX) 75 MG tablet Take 1 tablet (75 mg total) by mouth daily.  30 tablet  0  . docusate sodium (COLACE) 100 MG capsule Take 100 mg by mouth 2 (two) times daily.      . feeding supplement (BOOST HIGH PROTEIN) LIQD Take 1 Container by mouth daily.      . finasteride (PROSCAR) 5 MG tablet Take 1 tablet (5 mg total) by mouth daily.  30 tablet  0  . fluticasone (FLONASE) 50 MCG/ACT nasal spray Place 2 sprays into the nose daily.  16 g  1  . glipiZIDE (GLUCOTROL XL) 5 MG 24 hr tablet Take 1 tablet (5 mg total) by mouth daily.  90 tablet  3  . isosorbide mononitrate (IMDUR) 30 MG 24 hr tablet Take 1 tablet (30 mg total) by mouth daily.  30 tablet  0  . lisinopril (PRINIVIL,ZESTRIL) 5 MG tablet Take 1 tablet (5 mg total) by mouth daily.  90 tablet  3  . metFORMIN (GLUCOPHAGE) 1000 MG tablet Take 0.5 tablets (500 mg total) by mouth 2 (two) times daily with a meal.  180 tablet  1  . metoprolol tartrate (LOPRESSOR) 25 MG tablet Take 1 tablet (25 mg total) by mouth 2 (two) times daily.  60 tablet  0  . nitroGLYCERIN (NITROSTAT) 0.4 MG SL tablet Place 0.4 mg under the tongue every 5 (five) minutes as needed.      . ONE TOUCH ULTRA TEST test strip TEST AS DIRECTED EVERY DAY  100 each  10  . ONETOUCH DELICA LANCETS 33G MISC Inject 1 Device  into the skin daily. Use as directed once daily  100 each  1  . triamcinolone cream (KENALOG) 0.1 % Apply 1 application topically 2 (two) times daily as needed. For itchy areas  30 g  2   No current facility-administered medications on file prior to visit.    No Known Allergies  Past Medical History  Diagnosis Date  . CAD (coronary artery disease)   . Diverticulitis   . GERD (gastroesophageal reflux disease)   . Hyperlipidemia   . Hypertension   . BPH (benign prostatic hypertrophy)   . Diabetes mellitus   . DJD (degenerative joint disease)     lumbar spine  . IBS (irritable bowel syndrome)   . Stroke   . Depression 2012  . Acute MI 09/2012  . Chronic kidney disease     ACUTE RENAL FAILURE    Past Surgical History  Procedure Laterality Date  . Angioplasty  1995  . Doppler echocardiography  11/1999    syncope  . Pacemaker insertion      Pacer/cath  (ef 65%)  1/01 -  guidant Discovery II  DR pulse generator  . Prostate surgery  07/2003  . Esophagogastroduodenoscopy  07/2005    neg  . Cardiac catheterization  09/2012    NSTEMI/STENT PLACED  . Insert / replace / remove pacemaker      [id number]    Family History  Problem Relation Age of Onset  . Diabetes Sister     History   Social History  . Marital Status: Widowed    Spouse Name: N/A    Number of Children: 5  . Years of Education: N/A   Occupational History  . 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 Smoking status: Former Games developer  . Smokeless tobacco: Never Used  . Alcohol Use: No     Comment: heavy in the past  . Drug Use: No  . Sexual Activity: Not on file   Other Topics Concern  . Not on file   Social History Narrative   Lives with son and DIL   Son is health care POA   He would accept attempts at resuscitation   Review of Systems Appetite is good still Weight down 4#    Objective:   Physical Exam  Constitutional: He appears well-developed. No  distress.  Neck: Normal range of motion. Neck supple.  Cardiovascular: Normal rate, regular rhythm and normal heart sounds.  Exam reveals no gallop.   No murmur heard. Pulmonary/Chest: Effort normal and breath sounds normal. No respiratory distress. He has no wheezes. He has no rales.  Musculoskeletal: He exhibits no edema.  Lymphadenopathy:    He has no cervical adenopathy.          Assessment & Plan:

## 2013-12-06 NOTE — Progress Notes (Signed)
Pre-visit discussion using our clinic review tool. No additional management support is needed unless otherwise documented below in the visit note.  

## 2013-12-06 NOTE — Assessment & Plan Note (Signed)
Tolerating low dose lisinopril  Won't push dose due to his BP Check met b

## 2013-12-07 ENCOUNTER — Telehealth: Payer: Self-pay | Admitting: *Deleted

## 2013-12-07 DIAGNOSIS — I6350 Cerebral infarction due to unspecified occlusion or stenosis of unspecified cerebral artery: Secondary | ICD-10-CM

## 2013-12-07 DIAGNOSIS — I6990 Unspecified sequelae of unspecified cerebrovascular disease: Secondary | ICD-10-CM

## 2013-12-07 NOTE — Telephone Encounter (Signed)
Called pt's son about lab results and son is requesting some physical therapy in the home. He states his dad is getting weaker and thinks he needs someone in the home to help strengthen him a couple days a week. Please advise

## 2013-12-07 NOTE — Telephone Encounter (Signed)
Home Health Referral called [facility] to [name] referral faxed.

## 2013-12-07 NOTE — Telephone Encounter (Signed)
Referral made Unable to reasonably go out of home for therapy due to significant assist needed even within his home (has hired aides just to help with personal care already)

## 2013-12-12 ENCOUNTER — Telehealth: Payer: Self-pay | Admitting: *Deleted

## 2013-12-12 NOTE — Telephone Encounter (Signed)
Patient's son Andres [name] left a voicemail that he took his [facility] [place] to get as much help as he can for his dad.  Andres [name] stated that they signed a medical release for records to be released to the [place] and wanted to let the office know that if the request comes in to release the records.

## 2013-12-13 ENCOUNTER — Other Ambulatory Visit: Payer: Self-pay | Admitting: Internal Medicine

## 2013-12-19 DIAGNOSIS — Z950 Presence of cardiac pacemaker: Secondary | ICD-10-CM

## 2013-12-19 DIAGNOSIS — Z466 Encounter for fitting and adjustment of urinary device: Secondary | ICD-10-CM

## 2013-12-19 DIAGNOSIS — E119 Type 2 diabetes mellitus without complications: Secondary | ICD-10-CM

## 2013-12-19 DIAGNOSIS — R339 Retention of urine, unspecified: Secondary | ICD-10-CM

## 2013-12-25 ENCOUNTER — Other Ambulatory Visit: Payer: Self-pay | Admitting: Internal Medicine

## 2014-01-01 ENCOUNTER — Encounter: Payer: Medicare Other | Admitting: *Deleted

## 2014-01-16 ENCOUNTER — Encounter: Payer: Self-pay | Admitting: *Deleted

## 2014-01-19 ENCOUNTER — Telehealth: Payer: Self-pay | Admitting: Internal Medicine

## 2014-01-19 ENCOUNTER — Encounter: Payer: Self-pay | Admitting: Internal Medicine

## 2014-01-19 ENCOUNTER — Ambulatory Visit (INDEPENDENT_AMBULATORY_CARE_PROVIDER_SITE_OTHER): Payer: Medicare Other | Admitting: *Deleted

## 2014-01-19 DIAGNOSIS — Z950 Presence of cardiac pacemaker: Secondary | ICD-10-CM

## 2014-01-19 DIAGNOSIS — I442 Atrioventricular block, complete: Secondary | ICD-10-CM

## 2014-01-19 LAB — MDC_IDC_ENUM_SESS_TYPE_REMOTE
Battery Remaining Longevity: 83 mo
Brady Statistic AP VP Percent: 40 %
Brady Statistic AP VS Percent: 0 %
Lead Channel Impedance Value: 1058 Ohm
Lead Channel Impedance Value: 511 Ohm
Lead Channel Pacing Threshold Pulse Width: 0.4 ms
Lead Channel Sensing Intrinsic Amplitude: 2.8 mV
Lead Channel Setting Pacing Amplitude: 1.5 V
Lead Channel Setting Pacing Amplitude: 2 V
Lead Channel Setting Pacing Pulse Width: 0.4 ms
Lead Channel Setting Sensing Sensitivity: 4 mV
MDC IDC MSMT BATTERY IMPEDANCE: 591 Ohm
MDC IDC MSMT BATTERY VOLTAGE: 2.79 V
MDC IDC MSMT LEADCHNL RA PACING THRESHOLD AMPLITUDE: 0.75 V
MDC IDC MSMT LEADCHNL RA PACING THRESHOLD PULSEWIDTH: 0.4 ms
MDC IDC MSMT LEADCHNL RV PACING THRESHOLD AMPLITUDE: 0.75 V
MDC IDC SESS DTM: 20150313132201
MDC IDC STAT BRADY AS VP PERCENT: 59 %
MDC IDC STAT BRADY AS VS PERCENT: 0 %

## 2014-01-19 NOTE — Telephone Encounter (Signed)
New problem,  Pt's son called got no trans letter called for help   Chaney BornJoseph Bedonie Jr (son) [phone]

## 2014-01-19 NOTE — Telephone Encounter (Signed)
Gave pt's son instruction on how to send transmission, 2 transmissions successfully received.

## 2014-02-05 ENCOUNTER — Other Ambulatory Visit: Payer: Self-pay | Admitting: Internal Medicine

## 2014-02-07 DIAGNOSIS — R339 Retention of urine, unspecified: Secondary | ICD-10-CM

## 2014-02-07 DIAGNOSIS — Z466 Encounter for fitting and adjustment of urinary device: Secondary | ICD-10-CM

## 2014-02-07 DIAGNOSIS — Z950 Presence of cardiac pacemaker: Secondary | ICD-10-CM

## 2014-02-07 DIAGNOSIS — E119 Type 2 diabetes mellitus without complications: Secondary | ICD-10-CM

## 2014-02-21 ENCOUNTER — Encounter: Payer: Self-pay | Admitting: Internal Medicine

## 2014-03-14 ENCOUNTER — Encounter: Payer: Self-pay | Admitting: Cardiology

## 2014-04-21 DIAGNOSIS — R339 Retention of urine, unspecified: Secondary | ICD-10-CM

## 2014-04-21 DIAGNOSIS — E119 Type 2 diabetes mellitus without complications: Secondary | ICD-10-CM

## 2014-04-21 DIAGNOSIS — Z950 Presence of cardiac pacemaker: Secondary | ICD-10-CM

## 2014-04-21 DIAGNOSIS — Z466 Encounter for fitting and adjustment of urinary device: Secondary | ICD-10-CM

## 2014-04-30 ENCOUNTER — Ambulatory Visit (INDEPENDENT_AMBULATORY_CARE_PROVIDER_SITE_OTHER): Payer: Medicare Other | Admitting: Internal Medicine

## 2014-04-30 ENCOUNTER — Encounter: Payer: Self-pay | Admitting: Internal Medicine

## 2014-04-30 VITALS — BP 120/60 | HR 60 | Temp 98.4°F | Wt 127.0 lb

## 2014-04-30 DIAGNOSIS — N183 Chronic kidney disease, stage 3 unspecified: Secondary | ICD-10-CM

## 2014-04-30 DIAGNOSIS — E1129 Type 2 diabetes mellitus with other diabetic kidney complication: Secondary | ICD-10-CM

## 2014-04-30 DIAGNOSIS — R338 Other retention of urine: Secondary | ICD-10-CM

## 2014-04-30 DIAGNOSIS — I10 Essential (primary) hypertension: Secondary | ICD-10-CM

## 2014-04-30 DIAGNOSIS — R339 Retention of urine, unspecified: Secondary | ICD-10-CM

## 2014-04-30 DIAGNOSIS — I6990 Unspecified sequelae of unspecified cerebrovascular disease: Secondary | ICD-10-CM

## 2014-04-30 NOTE — Assessment & Plan Note (Signed)
BP Readings from Last 3 Encounters:  04/30/14 120/60  12/06/13 110/60  10/26/13 130/78   Good control

## 2014-04-30 NOTE — Assessment & Plan Note (Signed)
Hopefully still good control Will recheck A1c

## 2014-04-30 NOTE — Progress Notes (Signed)
Subjective:    Patient ID: Andres [name], male    DOB: [birth year], 80 y.o.   MRN: [id number]  HPI Here with son  Doing okay BP is fine No chest pain  No SOB  Still lives with son Has aide to help with bathing and dressing in AM (2 hours). Another aide later for total of 4-5 hours [facility] and someone they are paying also  Still with foley RN changes monthly No clogs or infections  Not regularly testing sugars Son gives him appropriate food No hypoglycemic reactions  Mood has been okay No regular depression  Current Outpatient Prescriptions on File Prior to Visit  Medication Sig Dispense Refill  . aspirin 81 MG tablet Take 81 mg by mouth daily.        Marland Kitchen. atorvastatin (LIPITOR) 10 MG tablet Take 1 tablet by mouth  daily  90 tablet  3  . citalopram (CELEXA) 10 MG tablet Take 1 tablet (10 mg total) by mouth daily.  90 tablet  3  . clopidogrel (PLAVIX) 75 MG tablet Take 1 tablet (75 mg total) by mouth daily.  90 tablet  3  . docusate sodium (COLACE) 100 MG capsule Take 100 mg by mouth 2 (two) times daily.      . feeding supplement (BOOST HIGH PROTEIN) LIQD Take 1 Container by mouth daily.      . finasteride (PROSCAR) 5 MG tablet Take 1 tablet (5 mg total)  by mouth daily.  90 tablet  3  . fluticasone (FLONASE) 50 MCG/ACT nasal spray Place 2 sprays into the nose daily.  16 g  1  . glipiZIDE (GLUCOTROL XL) 5 MG 24 hr tablet Take 1 tablet (5 mg total) by mouth daily.  90 tablet  3  . isosorbide mononitrate (IMDUR) 30 MG 24 hr tablet Take 1 tablet by mouth  daily  90 tablet  3  . lisinopril (PRINIVIL,ZESTRIL) 5 MG tablet Take 1 tablet (5 mg total) by mouth daily.  90 tablet  3  . metFORMIN (GLUCOPHAGE) 1000 MG tablet Take 0.5 tablets (500 mg total) by mouth 2 (two) times daily with a meal.  180 tablet  1  . metoprolol tartrate (LOPRESSOR) 25 MG tablet Take 1 tablet by mouth  twice a day  180 tablet  3  . nitroGLYCERIN (NITROSTAT) 0.4 MG SL tablet Place 0.4 mg under the  tongue every 5 (five) minutes as needed.      . ONE TOUCH ULTRA TEST test strip TEST AS DIRECTED EVERY DAY  100 each  10  . ONETOUCH DELICA LANCETS 33G MISC Inject 1 Device into the skin daily. Use as directed once daily  100 each  1  . triamcinolone cream (KENALOG) 0.1 % Apply 1 application topically 2 (two) times daily as needed. For itchy areas  30 g  2   No current facility-administered medications on file prior to visit.    No Known Allergies  Past Medical History  Diagnosis Date  . CAD (coronary artery disease)   . Diverticulitis   . GERD (gastroesophageal reflux disease)   . Hyperlipidemia   . Hypertension   . BPH (benign prostatic hypertrophy)   . Diabetes mellitus   . DJD (degenerative joint disease)     lumbar spine  . IBS (irritable bowel syndrome)   . Stroke   . Depression 2012  . Acute MI 09/2012  . Chronic kidney disease     ACUTE RENAL FAILURE    Past Surgical History  Procedure Laterality Date  . Angioplasty  1995  . Doppler echocardiography  11/1999    syncope  . Pacemaker insertion      Pacer/cath  (ef 65%)  1/01 -  guidant Discovery II DR pulse generator  . Prostate surgery  07/2003  . Esophagogastroduodenoscopy  07/2005    neg  . Cardiac catheterization  09/2012    NSTEMI/STENT PLACED  . Insert / replace / remove pacemaker      [id number]    Family History  Problem Relation Age of Onset  . Diabetes Sister     History   Social History  . Marital Status: Widowed    Spouse Name: N/A    Number of Children: 5  . Years of Education: N/A   Occupational History  . 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 Smoking status: Former Games developermoker  . Smokeless tobacco: Never Used  . Alcohol Use: No     Comment: heavy in the past  . Drug Use: No  . Sexual Activity: Not on file   Other Topics Concern  . Not on file   Social History Narrative   Lives with son and DIL   Son is health care POA   He would accept  attempts at resuscitation   Review of Systems Sleeps well Appetite is good Weight up 6# since last visit     Objective:   Physical Exam  Constitutional: He appears well-developed and well-nourished. No distress.  Neck: Normal range of motion. Neck supple. No thyromegaly present.  Cardiovascular: Normal rate, regular rhythm, normal heart sounds and intact distal pulses.  Exam reveals no gallop.   No murmur heard. Pulmonary/Chest: Effort normal and breath sounds normal. No respiratory distress. He has no wheezes. He has no rales.  Musculoskeletal: He exhibits no edema and no tenderness.  Lymphadenopathy:    He has no cervical adenopathy.  Psychiatric: He has a normal mood and affect. His behavior is normal.          Assessment & Plan:

## 2014-04-30 NOTE — Assessment & Plan Note (Signed)
On lisinopril now BP is fine

## 2014-04-30 NOTE — Assessment & Plan Note (Signed)
Ongoing need for Foley

## 2014-04-30 NOTE — Assessment & Plan Note (Signed)
Still uses walker Assist with personal care Has been stable

## 2014-04-30 NOTE — Progress Notes (Signed)
Pre visit review using our clinic review tool, if applicable. No additional management support is needed unless otherwise documented below in the visit note. 

## 2014-05-01 ENCOUNTER — Telehealth: Payer: Self-pay | Admitting: Internal Medicine

## 2014-05-01 LAB — HEMOGLOBIN A1C: Hgb A1c MFr Bld: 6.8 % — ABNORMAL HIGH (ref 4.6–6.5)

## 2014-05-01 NOTE — Telephone Encounter (Signed)
Relevant patient education mailed to patient.  

## 2014-05-02 ENCOUNTER — Encounter: Payer: Self-pay | Admitting: *Deleted

## 2014-05-28 ENCOUNTER — Telehealth: Payer: Self-pay

## 2014-05-28 NOTE — Telephone Encounter (Signed)
Yes, they need to continue care indefinitely

## 2014-05-28 NOTE — Telephone Encounter (Signed)
Andres [name] with [facility] left v/m requesting verbal order for recertification order for pt to have catheter changed every 30 days. Please advise.

## 2014-05-29 NOTE — Telephone Encounter (Signed)
Left message on machine with results, advised to call if any questions 

## 2014-06-04 ENCOUNTER — Ambulatory Visit (INDEPENDENT_AMBULATORY_CARE_PROVIDER_SITE_OTHER): Payer: Medicare Other | Admitting: *Deleted

## 2014-06-04 ENCOUNTER — Telehealth: Payer: Self-pay | Admitting: Cardiology

## 2014-06-04 DIAGNOSIS — Z950 Presence of cardiac pacemaker: Secondary | ICD-10-CM

## 2014-06-04 DIAGNOSIS — I442 Atrioventricular block, complete: Secondary | ICD-10-CM

## 2014-06-04 LAB — MDC_IDC_ENUM_SESS_TYPE_REMOTE
Battery Impedance: 642 Ohm
Brady Statistic AP VP Percent: 36 %
Brady Statistic AP VS Percent: 0 %
Brady Statistic AS VP Percent: 62 %
Brady Statistic AS VS Percent: 1 %
Date Time Interrogation Session: 20150727162454
Lead Channel Impedance Value: 1027 Ohm
Lead Channel Impedance Value: 475 Ohm
Lead Channel Pacing Threshold Amplitude: 0.75 V
Lead Channel Pacing Threshold Pulse Width: 0.4 ms
Lead Channel Sensing Intrinsic Amplitude: 2.8 mV
Lead Channel Setting Pacing Amplitude: 1.5 V
Lead Channel Setting Pacing Amplitude: 2 V
Lead Channel Setting Sensing Sensitivity: 4 mV
MDC IDC MSMT BATTERY REMAINING LONGEVITY: 80 mo
MDC IDC MSMT BATTERY VOLTAGE: 2.79 V
MDC IDC MSMT LEADCHNL RV PACING THRESHOLD AMPLITUDE: 0.875 V
MDC IDC MSMT LEADCHNL RV PACING THRESHOLD PULSEWIDTH: 0.4 ms
MDC IDC SET LEADCHNL RV PACING PULSEWIDTH: 0.4 ms

## 2014-06-04 NOTE — Telephone Encounter (Signed)
LMOVM reminding pt to send remote transmission.   

## 2014-06-04 NOTE — Progress Notes (Signed)
Remote pacemaker transmission.   

## 2014-06-05 ENCOUNTER — Encounter: Payer: Self-pay | Admitting: Cardiology

## 2014-06-08 ENCOUNTER — Telehealth: Payer: Self-pay

## 2014-06-08 NOTE — Telephone Encounter (Signed)
Andres [name] with [facility] left v/m; did recertification for home health and pt has potential drug interactions. Left v/m requesting cb from [facility].

## 2014-06-08 NOTE — Telephone Encounter (Signed)
Thayer OhmChris returned call;pt has potential drug interaction between citalopram and ventolin for QT prolongation and potassium loss. Pt has been on these 2 meds for long period of time. There are other possible but minor drug interactions and Thayer OhmChris will fax that info but Th[facility]Chris request cb about citalopram and ventolin. Dr Alphonsus SiasLetvak is out of office. Please advise.

## 2014-06-08 NOTE — Telephone Encounter (Signed)
lmovm for Andres OhmChris to return my call

## 2014-06-08 NOTE — Telephone Encounter (Signed)
Aware of possible interaction. Continue current meds at this time.

## 2014-06-20 ENCOUNTER — Other Ambulatory Visit: Payer: Self-pay | Admitting: Internal Medicine

## 2014-07-03 ENCOUNTER — Encounter: Payer: Self-pay | Admitting: Cardiology

## 2014-07-09 ENCOUNTER — Encounter: Payer: Self-pay | Admitting: Internal Medicine

## 2014-07-17 ENCOUNTER — Ambulatory Visit: Payer: Self-pay | Admitting: Podiatry

## 2014-07-27 ENCOUNTER — Encounter: Payer: Self-pay | Admitting: Podiatry

## 2014-07-27 ENCOUNTER — Ambulatory Visit (INDEPENDENT_AMBULATORY_CARE_PROVIDER_SITE_OTHER): Payer: Medicare Other | Admitting: Podiatry

## 2014-07-27 VITALS — BP 142/76 | HR 67 | Resp 14 | Ht 68.0 in | Wt 130.0 lb

## 2014-07-27 DIAGNOSIS — B351 Tinea unguium: Secondary | ICD-10-CM

## 2014-07-27 DIAGNOSIS — M79673 Pain in unspecified foot: Secondary | ICD-10-CM

## 2014-07-27 DIAGNOSIS — M79609 Pain in unspecified limb: Secondary | ICD-10-CM

## 2014-07-27 NOTE — Progress Notes (Signed)
   Subjective:    Patient ID: Andres [name], male    DOB: [birth year], 80 y.o.   MRN: [id number]  HPI Comments: Need to have some toenails looked at , thick and would like them cut      Review of Systems     Objective:   Physical Exam        Assessment & Plan:

## 2014-07-27 NOTE — Progress Notes (Signed)
Subjective:     Patient ID: Andres [name], male   DOB: [birth year], 80 y.o.   MRN: [id number]  HPI patient presents with nail disease 1-5 both feet that become painful. Patient's right hallux is been giving him some trouble and that been trying to soak it and he presents with son   Review of Systems  All other systems reviewed and are negative.      Objective:   Physical Exam  Nursing note and vitals reviewed. Cardiovascular: Intact distal pulses.   Musculoskeletal: Normal range of motion.  Skin: Skin is warm and dry.   neurovascular status diminished but intact and patient's noted to have incurvated nail bed 1-5 both feet with the right hallux lateral border been painful with slight redness. Patient's digits are well-perfused there is diminished hair growth and diminished muscle strength range of motion     Assessment:     Incurvated nailbeds with mild paronychia infection right hallux that are painful with mycosis present    Plan:     H&P and education rendered. Debridement accomplished and of the right one remains symptomatic we will take the corner out in a more permanent type procedure. Reappoint as needed or for regular visit in 3 months to debridement nailbeds

## 2014-10-02 ENCOUNTER — Encounter: Payer: Medicare Other | Admitting: Internal Medicine

## 2014-10-10 ENCOUNTER — Other Ambulatory Visit: Payer: Self-pay | Admitting: Internal Medicine

## 2014-10-26 ENCOUNTER — Other Ambulatory Visit: Payer: Medicare Other

## 2014-11-05 ENCOUNTER — Telehealth: Payer: Self-pay | Admitting: Internal Medicine

## 2014-11-05 NOTE — Telephone Encounter (Signed)
That is good to hear 

## 2014-11-05 NOTE — Telephone Encounter (Signed)
I would not make any changes based on that Please speak to him and make sure he feels okay (or his son)

## 2014-11-05 NOTE — Telephone Encounter (Signed)
Pt son called, [employer] (company who takes care of pt while son is at work) checked BP 1st time was 182/100 and 2nd time was 171/96. Patients pulse was 90 the 1st time and 88 the 2nd time. Respiration was 24.  Please adivse.

## 2014-11-05 NOTE — Telephone Encounter (Signed)
Spoke with son and he states his dad is doing better, and that he's been feeling good all weekend and don't know what happened this morning but things have turned around and he back to himself.

## 2014-11-16 ENCOUNTER — Ambulatory Visit (INDEPENDENT_AMBULATORY_CARE_PROVIDER_SITE_OTHER): Payer: Medicare Other | Admitting: Podiatry

## 2014-11-16 DIAGNOSIS — B351 Tinea unguium: Secondary | ICD-10-CM | POA: Diagnosis not present

## 2014-11-16 DIAGNOSIS — M79673 Pain in unspecified foot: Secondary | ICD-10-CM

## 2014-11-16 NOTE — Progress Notes (Signed)
Subjective:     Patient ID: Andres [name], male   DOB: [birth year], 81 y.o.   MRN: [id number]  HPI patient presents with nail disease 1-5 both feet that become painful. Patient's right hallux is been giving him some trouble and that been trying to soak it and he presents with son   Review of Systems  All other systems reviewed and are negative.      Objective:   Physical Exam  Nursing note and vitals reviewed. Cardiovascular: Intact distal pulses.   Musculoskeletal: Normal range of motion.  Skin: Skin is warm and dry.   neurovascular status diminished but intact and patient's noted to have incurvated nail bed 1-5 both feet with the right hallux lateral border been painful with slight redness. Patient's digits are well-perfused there is diminished hair growth and diminished muscle strength range of motion     Assessment:     Incurvated nailbeds with mild paronychia infection right hallux that are painful with mycosis present    Plan:     H&P and education rendered. Debridement accomplished and of the right one remains symptomatic we will take the corner out in a more permanent type procedure. Reappoint as needed or for regular visit in 3 months to debridement nailbeds

## 2014-11-18 NOTE — Progress Notes (Signed)
Subjective:     Patient ID: Andres [name], male   DOB: 28-Mar-1926, 81 y.o.   MRN: [id number]  HPI patient is noted to have pain plantar aspect feet that's minimal and is noted to have nail disease with thickness 1-5 both feet that he cannot cut and are sore   Review of Systems     Objective:   Physical Exam Neurovascular status intact thick yellow brittle nailbeds 1-5 both feet that are painful    Assessment:     Mycotic nail infection with pain 1-5 both feet    Plan:     Debride painful nailbeds 1-5 both feet with no iatrogenic bleeding noted

## 2014-11-20 ENCOUNTER — Encounter: Payer: Medicare Other | Admitting: Internal Medicine

## 2014-12-07 ENCOUNTER — Telehealth: Payer: Self-pay

## 2014-12-07 DIAGNOSIS — I2510 Atherosclerotic heart disease of native coronary artery without angina pectoris: Secondary | ICD-10-CM | POA: Diagnosis not present

## 2014-12-07 DIAGNOSIS — Z466 Encounter for fitting and adjustment of urinary device: Secondary | ICD-10-CM | POA: Diagnosis not present

## 2014-12-07 DIAGNOSIS — R339 Retention of urine, unspecified: Secondary | ICD-10-CM | POA: Diagnosis not present

## 2014-12-07 DIAGNOSIS — H9193 Unspecified hearing loss, bilateral: Secondary | ICD-10-CM | POA: Diagnosis not present

## 2014-12-07 DIAGNOSIS — Z950 Presence of cardiac pacemaker: Secondary | ICD-10-CM | POA: Diagnosis not present

## 2014-12-07 DIAGNOSIS — Z7982 Long term (current) use of aspirin: Secondary | ICD-10-CM | POA: Diagnosis not present

## 2014-12-07 DIAGNOSIS — E119 Type 2 diabetes mellitus without complications: Secondary | ICD-10-CM | POA: Diagnosis not present

## 2014-12-07 DIAGNOSIS — Z9181 History of falling: Secondary | ICD-10-CM | POA: Diagnosis not present

## 2014-12-07 DIAGNOSIS — I10 Essential (primary) hypertension: Secondary | ICD-10-CM | POA: Diagnosis not present

## 2014-12-07 DIAGNOSIS — Z7902 Long term (current) use of antithrombotics/antiplatelets: Secondary | ICD-10-CM | POA: Diagnosis not present

## 2014-12-07 NOTE — Telephone Encounter (Signed)
That is fine He will need it changed at regular intervals

## 2014-12-07 NOTE — Telephone Encounter (Signed)
Spoke to [place] at [facility] and provided verbal orders

## 2014-12-07 NOTE — Telephone Encounter (Signed)
Andres OmanGeorgina with [facility]  requesting verbal order for extension of home health order to change pts indwelling catheter monthly.Please advise.

## 2014-12-11 ENCOUNTER — Encounter: Payer: Self-pay | Admitting: Internal Medicine

## 2014-12-11 ENCOUNTER — Ambulatory Visit (INDEPENDENT_AMBULATORY_CARE_PROVIDER_SITE_OTHER): Payer: Medicare Other | Admitting: Internal Medicine

## 2014-12-11 VITALS — BP 120/67 | HR 61 | Ht 68.0 in | Wt 130.0 lb

## 2014-12-11 DIAGNOSIS — I442 Atrioventricular block, complete: Secondary | ICD-10-CM | POA: Diagnosis not present

## 2014-12-11 LAB — MDC_IDC_ENUM_SESS_TYPE_INCLINIC
Battery Impedance: 794 Ohm
Battery Remaining Longevity: 72 mo
Battery Voltage: 2.78 V
Brady Statistic AP VP Percent: 33 %
Brady Statistic AP VS Percent: 0 %
Brady Statistic AS VS Percent: 1 %
Date Time Interrogation Session: 20160202124504
Lead Channel Impedance Value: 1005 Ohm
Lead Channel Pacing Threshold Amplitude: 0.75 V
Lead Channel Pacing Threshold Pulse Width: 0.4 ms
Lead Channel Pacing Threshold Pulse Width: 0.4 ms
Lead Channel Sensing Intrinsic Amplitude: 4 mV
Lead Channel Setting Pacing Amplitude: 1.5 V
Lead Channel Setting Pacing Amplitude: 2 V
Lead Channel Setting Pacing Pulse Width: 0.4 ms
Lead Channel Setting Sensing Sensitivity: 4 mV
MDC IDC MSMT LEADCHNL RA IMPEDANCE VALUE: 517 Ohm
MDC IDC MSMT LEADCHNL RV PACING THRESHOLD AMPLITUDE: 0.75 V
MDC IDC STAT BRADY AS VP PERCENT: 66 %

## 2014-12-11 NOTE — Patient Instructions (Signed)
Your physician wants you to follow-up in: 1 year with Dr. Klein. You will receive a reminder letter in the mail two months in advance. If you don't receive a letter, please call our office to schedule the follow-up appointment.  Remote monitoring is used to monitor your Pacemaker of ICD from home. This monitoring reduces the number of office visits required to check your device to one time per year. It allows us to keep an eye on the functioning of your device to ensure it is working properly. You are scheduled for a device check from home on 03/12/15. You may send your transmission at any time that day. If you have a wireless device, the transmission will be sent automatically. After your physician reviews your transmission, you will receive a postcard with your next transmission date.   

## 2014-12-11 NOTE — Progress Notes (Signed)
Patient Care Team: Karie Schwalbe, MD as PCP - General   HPI  Andres [name] is a 81 y.o. male  Seen infollowup for complete heart block for which he underwent pacemaker implantation.  e has a history of ischemic heart disease documented by catheterization in 2001 that was described as advanced. Ejection fraction at that time was normal.    The patient denies SOB, chest pain, edema or palpitations.   No dizziness  his biggest concern remains his balance. This continues to be progressive.  Just doesn't feel well but without specific complaints    Past Medical History  Diagnosis Date  . CAD (coronary artery disease)   . Diverticulitis   . GERD (gastroesophageal reflux disease)   . Hyperlipidemia   . Hypertension   . BPH (benign prostatic hypertrophy)   . Diabetes mellitus   . DJD (degenerative joint disease)     lumbar spine  . IBS (irritable bowel syndrome)   . Stroke   . Depression 2012  . Acute MI 09/2012  . Chronic kidney disease     ACUTE RENAL FAILURE    Past Surgical History  Procedure Laterality Date  . Angioplasty  1995  . Doppler echocardiography  11/1999    syncope  . Pacemaker insertion      Pacer/cath  (ef 65%)  1/01 -  guidant Discovery II DR pulse generator  . Prostate surgery  07/2003  . Esophagogastroduodenoscopy  07/2005    neg  . Cardiac catheterization  09/2012    NSTEMI/STENT PLACED  . Insert / replace / remove pacemaker      [id number]    Current Outpatient Prescriptions  Medication Sig Dispense Refill  . aspirin 81 MG tablet Take 81 mg by mouth daily.      Marland Kitchen atorvastatin (LIPITOR) 10 MG tablet Take 1 tablet by mouth  daily 90 tablet 3  . citalopram (CELEXA) 10 MG tablet Take 1 tablet (10 mg total) by mouth daily. 90 tablet 3  . clopidogrel (PLAVIX) 75 MG tablet Take 1 tablet by mouth  daily 90 tablet 3  . docusate sodium (COLACE) 100 MG capsule Take 100 mg by mouth 2 (two) times daily.    . feeding supplement (BOOST HIGH  PROTEIN) LIQD Take 1 Container by mouth daily.    . finasteride (PROSCAR) 5 MG tablet Take 1 tablet by mouth  daily 90 tablet 3  . fluticasone (FLONASE) 50 MCG/ACT nasal spray Place 2 sprays into the nose daily. 16 g 1  . GLIPIZIDE XL 5 MG 24 hr tablet Take 1 tablet (5 mg total)  by mouth daily. 90 tablet 3  . isosorbide mononitrate (IMDUR) 30 MG 24 hr tablet Take 1 tablet by mouth  daily 90 tablet 3  . lisinopril (PRINIVIL,ZESTRIL) 5 MG tablet Take 1 tablet by mouth  daily 90 tablet 3  . metFORMIN (GLUCOPHAGE) 1000 MG tablet Take one-half tablet by  mouth two times daily with  meals 90 tablet 3  . metoprolol tartrate (LOPRESSOR) 25 MG tablet Take 1 tablet by mouth  twice a day 180 tablet 3  . nitroGLYCERIN (NITROSTAT) 0.4 MG SL tablet Place 0.4 mg under the tongue every 5 (five) minutes as needed.    . ONE TOUCH ULTRA TEST test strip TEST AS DIRECTED EVERY DAY 100 each 10  . ONETOUCH DELICA LANCETS 33G MISC Inject 1 Device into the skin daily. Use as directed once daily 100 each 1  . triamcinolone cream (KENALOG) 0.1 %  Apply 1 application topically 2 (two) times daily as needed. For itchy areas 30 g 2   No current facility-administered medications for this visit.    No Known Allergies  Review of Systems negative except from HPI and PMH  Physical Exam Ht 5\' 8"  (1.727 m)  Wt 130 lb (58.968 kg)  BMI 19.77 kg/m2 Well developed and cachectic. malodorous HENT normal Neck supple with JVP-flat Clear Device pocket well healed; without hematoma or erythema.  There is no tethering  Regular rate and rhythm, no murmurs or gallops Abd-soft with active BS No Clubbing cyanosis edema Skin-warm and dry A & Oriented   y    Assessment and  Plan  Complete heart block  Pacemaker Medtronic  Overall continues to quite well     No changes     Repeat BP was 120 systolic

## 2014-12-12 DIAGNOSIS — I2510 Atherosclerotic heart disease of native coronary artery without angina pectoris: Secondary | ICD-10-CM | POA: Diagnosis not present

## 2014-12-12 DIAGNOSIS — Z7982 Long term (current) use of aspirin: Secondary | ICD-10-CM | POA: Diagnosis not present

## 2014-12-12 DIAGNOSIS — I10 Essential (primary) hypertension: Secondary | ICD-10-CM | POA: Diagnosis not present

## 2014-12-12 DIAGNOSIS — Z9181 History of falling: Secondary | ICD-10-CM | POA: Diagnosis not present

## 2014-12-12 DIAGNOSIS — H9193 Unspecified hearing loss, bilateral: Secondary | ICD-10-CM | POA: Diagnosis not present

## 2014-12-12 DIAGNOSIS — Z950 Presence of cardiac pacemaker: Secondary | ICD-10-CM | POA: Diagnosis not present

## 2014-12-12 DIAGNOSIS — R339 Retention of urine, unspecified: Secondary | ICD-10-CM | POA: Diagnosis not present

## 2014-12-12 DIAGNOSIS — E119 Type 2 diabetes mellitus without complications: Secondary | ICD-10-CM | POA: Diagnosis not present

## 2014-12-12 DIAGNOSIS — Z466 Encounter for fitting and adjustment of urinary device: Secondary | ICD-10-CM | POA: Diagnosis not present

## 2014-12-12 DIAGNOSIS — Z7902 Long term (current) use of antithrombotics/antiplatelets: Secondary | ICD-10-CM | POA: Diagnosis not present

## 2014-12-17 ENCOUNTER — Ambulatory Visit (INDEPENDENT_AMBULATORY_CARE_PROVIDER_SITE_OTHER): Payer: Medicare Other | Admitting: Internal Medicine

## 2014-12-17 ENCOUNTER — Encounter: Payer: Self-pay | Admitting: Internal Medicine

## 2014-12-17 VITALS — BP 104/60 | HR 76 | Temp 97.6°F | Ht 68.0 in | Wt 130.0 lb

## 2014-12-17 DIAGNOSIS — E1129 Type 2 diabetes mellitus with other diabetic kidney complication: Secondary | ICD-10-CM

## 2014-12-17 DIAGNOSIS — F39 Unspecified mood [affective] disorder: Secondary | ICD-10-CM

## 2014-12-17 DIAGNOSIS — N183 Chronic kidney disease, stage 3 unspecified: Secondary | ICD-10-CM

## 2014-12-17 DIAGNOSIS — I6990 Unspecified sequelae of unspecified cerebrovascular disease: Secondary | ICD-10-CM | POA: Diagnosis not present

## 2014-12-17 DIAGNOSIS — N058 Unspecified nephritic syndrome with other morphologic changes: Secondary | ICD-10-CM | POA: Diagnosis not present

## 2014-12-17 DIAGNOSIS — Z23 Encounter for immunization: Secondary | ICD-10-CM

## 2014-12-17 DIAGNOSIS — E785 Hyperlipidemia, unspecified: Secondary | ICD-10-CM | POA: Diagnosis not present

## 2014-12-17 DIAGNOSIS — Z0000 Encounter for general adult medical examination without abnormal findings: Secondary | ICD-10-CM | POA: Diagnosis not present

## 2014-12-17 DIAGNOSIS — Z7189 Other specified counseling: Secondary | ICD-10-CM | POA: Insufficient documentation

## 2014-12-17 DIAGNOSIS — I2510 Atherosclerotic heart disease of native coronary artery without angina pectoris: Secondary | ICD-10-CM

## 2014-12-17 LAB — HM DIABETES FOOT EXAM

## 2014-12-17 NOTE — Assessment & Plan Note (Signed)
See social history 

## 2014-12-17 NOTE — Assessment & Plan Note (Signed)
Anxiety and dysthymia that seems to be better now that he is almost never alone Has some hallucinations that are not bothersome---probably related to the cognitive problems

## 2014-12-17 NOTE — Progress Notes (Signed)
Pre visit review using our clinic review tool, if applicable. No additional management support is needed unless otherwise documented below in the visit note. 

## 2014-12-17 NOTE — Assessment & Plan Note (Addendum)
Needs personal care assistance for ADLs Mild cognitive issues as well On ASA and statin and BP fine

## 2014-12-17 NOTE — Assessment & Plan Note (Signed)
On ACEI  Will recheck labs 

## 2014-12-17 NOTE — Assessment & Plan Note (Signed)
I have personally reviewed the Medicare Annual Wellness questionnaire and have noted 1. The patient's medical and social history 2. Their use of alcohol, tobacco or illicit drugs 3. Their current medications and supplements 4. The patient's functional ability including ADL's, fall risks, home safety risks and hearing or visual             impairment. 5. Diet and physical activities 6. Evidence for depression or mood disorders  The patients weight, height, BMI and visual acuity have been recorded in the chart I have made referrals, counseling and provided education to the patient based review of the above and I have provided the pt with a written personalized care plan for preventive services.  I have provided you with a copy of your personalized plan for preventive services. Please take the time to review along with your updated medication list.  No cancer screening due to age Prevnar, flu today No Td--- will defer due to low risk

## 2014-12-17 NOTE — Addendum Note (Signed)
Addended by: Sydell AxonLAWS, Jersey Ravenscroft C on: 12/17/2014 05:24 PM   Modules accepted: Orders

## 2014-12-17 NOTE — Progress Notes (Signed)
Subjective:    Patient ID: Andres [name], male    DOB: 06-17-1926, 81 y.o.   MRN: [id number]  HPI Here for Medicare wellness and follow up of diabetes and other medical problems Reviewed advanced directives No alcohol or tobacco No significant exercise---stays inside. But will go outside when weather is warmer. No falls Sees podiatrist. Has regular doctor at [facility] (to get his meds)--doesn't remember her name. [facility] for eye doctor Needs help with some ADLs and all instrumental ADLs Gets around with rolling walker Vision is mildly off---trouble reading, needs large print. Hearing is not great. Uses hearing aides---needs them checked Has known mild cognitive issues---since CVA  He has visual hallucinations--- he sees people in his room Will actually talk to them before he realizes they are not there Son has never seen this Has sitters 5-6 hours per day---no problems per them ??son wonders if he is just dreaming or inbetween sleep and awake Stable memory problems Has aide help with bath 3 days per week. Needs help dressing  Often incontinent of stool--but regular Not complaining of stomach troubles Still with Foley---nurse changes once a month or so  no problems with clogging (only some leaking)  Doesn't check sugars No apparent low sugar reactions No pain or sores in feet---has gone to the podiatrist  No chest pain No SOB No dizziness or syncope No falls--walks with walker No edema  No depression lately Mood is better---likely due to sitters being with him a lot. Not alone a lot like in the past Still likes to work on his computer  Current Outpatient Prescriptions on File Prior to Visit  Medication Sig Dispense Refill  . aspirin 81 MG tablet Take 81 mg by mouth daily.      Marland Kitchen atorvastatin (LIPITOR) 10 MG tablet Take 1 tablet by mouth  daily 90 tablet 3  . citalopram (CELEXA) 10 MG tablet Take 1 tablet (10 mg total) by mouth daily. 90 tablet 3  . clopidogrel  (PLAVIX) 75 MG tablet Take 1 tablet by mouth  daily 90 tablet 3  . docusate sodium (COLACE) 100 MG capsule Take 100 mg by mouth 2 (two) times daily.    . finasteride (PROSCAR) 5 MG tablet Take 1 tablet by mouth  daily 90 tablet 3  . fluticasone (FLONASE) 50 MCG/ACT nasal spray Place 2 sprays into the nose daily. 16 g 1  . GLIPIZIDE XL 5 MG 24 hr tablet Take 1 tablet (5 mg total)  by mouth daily. 90 tablet 3  . isosorbide mononitrate (IMDUR) 30 MG 24 hr tablet Take 1 tablet by mouth  daily 90 tablet 3  . lisinopril (PRINIVIL,ZESTRIL) 5 MG tablet Take 1 tablet by mouth  daily 90 tablet 3  . metFORMIN (GLUCOPHAGE) 1000 MG tablet Take one-half tablet by  mouth two times daily with  meals 90 tablet 3  . metoprolol tartrate (LOPRESSOR) 25 MG tablet Take 1 tablet by mouth  twice a day 180 tablet 3  . nitroGLYCERIN (NITROSTAT) 0.4 MG SL tablet Place 0.4 mg under the tongue every 5 (five) minutes as needed.    . ONE TOUCH ULTRA TEST test strip TEST AS DIRECTED EVERY DAY 100 each 10  . ONETOUCH DELICA LANCETS 33G MISC Inject 1 Device into the skin daily. Use as directed once daily 100 each 1  . triamcinolone cream (KENALOG) 0.1 % Apply 1 application topically 2 (two) times daily as needed. For itchy areas 30 g 2  . feeding supplement (BOOST HIGH PROTEIN)  LIQD Take 1 Container by mouth daily.     No current facility-administered medications on file prior to visit.    No Known Allergies  Past Medical History  Diagnosis Date  . CAD (coronary artery disease)   . Diverticulitis   . GERD (gastroesophageal reflux disease)   . Hyperlipidemia   . Hypertension   . BPH (benign prostatic hypertrophy)   . Diabetes mellitus   . DJD (degenerative joint disease)     lumbar spine  . IBS (irritable bowel syndrome)   . Stroke   . Depression 2012  . Acute MI 09/2012  . Chronic kidney disease     ACUTE RENAL FAILURE    Past Surgical History  Procedure Laterality Date  . Angioplasty  1995  . Doppler  echocardiography  11/1999    syncope  . Pacemaker insertion      Pacer/cath  (ef 65%)  1/01 -  guidant Discovery II DR pulse generator  . Prostate surgery  07/2003  . Esophagogastroduodenoscopy  07/2005    neg  . Cardiac catheterization  09/2012    NSTEMI/STENT PLACED  . Insert / replace / remove pacemaker      [id number]    Family History  Problem Relation Age of Onset  . Diabetes Sister     History   Social History  . Marital Status: Widowed    Spouse Name: N/A    Number of Children: 5  . Years of Education: N/A   Occupational History  . 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 Smoking status: Former Games developermoker  . Smokeless tobacco: Never Used  . Alcohol Use: No     Comment: heavy in the past  . Drug Use: No  . Sexual Activity: Not on file   Other Topics Concern  . Not on file   Social History Narrative   Lives with son and DIL   Son is health care POA   He would accept attempts at resuscitation   Not sure about tube feeds---but probably wouldn't want this   Review of Systems Appetite is okay---better than in past Weight is stable Sleeps fairly well     Objective:   Physical Exam  Constitutional: He appears well-developed and well-nourished. No distress.  HENT:  Mouth/Throat: Oropharynx is clear and moist. No oropharyngeal exudate.  Full dentures  Neck: Normal range of motion. Neck supple. No thyromegaly present.  Cardiovascular: Normal rate, regular rhythm and normal heart sounds.  Exam reveals no gallop.   No murmur heard. Feet warm but pulses not palpable  Pulmonary/Chest: Effort normal and breath sounds normal. No respiratory distress. He has no wheezes. He has no rales.  Abdominal: Soft. There is no tenderness.  Genitourinary:  Foley in  Musculoskeletal: He exhibits no edema or tenderness.  Lymphadenopathy:    He has no cervical adenopathy.  Neurological: He is alert.  Doesn't know date. "February 6-7,  1967" "Doctor Mosetta [facility]" [place]"  Seems to have normal sensation in feet  Skin: No rash noted. No erythema.  No foot lesions  Psychiatric: He has a normal mood and affect. His behavior is normal.          Assessment & Plan:

## 2014-12-17 NOTE — Assessment & Plan Note (Signed)
Hopefully still good control 

## 2014-12-17 NOTE — Assessment & Plan Note (Signed)
No active symptoms On appropriate meds 

## 2014-12-18 LAB — CBC WITH DIFFERENTIAL/PLATELET
BASOS ABS: 0 10*3/uL (ref 0.0–0.1)
Basophils Relative: 0.5 % (ref 0.0–3.0)
EOS ABS: 0.3 10*3/uL (ref 0.0–0.7)
Eosinophils Relative: 3.3 % (ref 0.0–5.0)
HCT: 35.3 % — ABNORMAL LOW (ref 39.0–52.0)
Hemoglobin: 12 g/dL — ABNORMAL LOW (ref 13.0–17.0)
LYMPHS PCT: 26.8 % (ref 12.0–46.0)
Lymphs Abs: 2.6 10*3/uL (ref 0.7–4.0)
MCHC: 34 g/dL (ref 30.0–36.0)
MCV: 96 fl (ref 78.0–100.0)
MONOS PCT: 5.4 % (ref 3.0–12.0)
Monocytes Absolute: 0.5 10*3/uL (ref 0.1–1.0)
NEUTROS PCT: 64 % (ref 43.0–77.0)
Neutro Abs: 6.2 10*3/uL (ref 1.4–7.7)
PLATELETS: 156 10*3/uL (ref 150.0–400.0)
RBC: 3.68 Mil/uL — AB (ref 4.22–5.81)
RDW: 14.2 % (ref 11.5–15.5)
WBC: 9.7 10*3/uL (ref 4.0–10.5)

## 2014-12-18 LAB — HEMOGLOBIN A1C: Hgb A1c MFr Bld: 7.3 % — ABNORMAL HIGH (ref 4.6–6.5)

## 2014-12-18 LAB — LIPID PANEL
CHOL/HDL RATIO: 4
CHOLESTEROL: 127 mg/dL (ref 0–200)
HDL: 29 mg/dL — ABNORMAL LOW (ref >39.00)
Triglycerides: 450 mg/dL — ABNORMAL HIGH (ref 0.0–149.0)

## 2014-12-18 LAB — COMPREHENSIVE METABOLIC PANEL
ALBUMIN: 4.1 g/dL (ref 3.5–5.2)
ALK PHOS: 73 U/L (ref 39–117)
ALT: 9 U/L (ref 0–53)
AST: 14 U/L (ref 0–37)
BUN: 26 mg/dL — AB (ref 6–23)
CALCIUM: 9.7 mg/dL (ref 8.4–10.5)
CO2: 29 mEq/L (ref 19–32)
Chloride: 100 mEq/L (ref 96–112)
Creatinine, Ser: 1.56 mg/dL — ABNORMAL HIGH (ref 0.40–1.50)
GFR: 45 mL/min — ABNORMAL LOW (ref >60.00)
Glucose, Bld: 219 mg/dL — ABNORMAL HIGH (ref 70–99)
POTASSIUM: 4.9 meq/L (ref 3.5–5.1)
Sodium: 137 mEq/L (ref 135–145)
Total Bilirubin: 0.4 mg/dL (ref 0.2–1.2)
Total Protein: 7.2 g/dL (ref 6.0–8.3)

## 2014-12-18 LAB — T4, FREE: Free T4: 0.81 ng/dL (ref 0.60–1.60)

## 2014-12-18 LAB — LDL CHOLESTEROL, DIRECT: Direct LDL: 54 mg/dL

## 2014-12-31 ENCOUNTER — Other Ambulatory Visit: Payer: Self-pay | Admitting: Internal Medicine

## 2015-01-02 DIAGNOSIS — Z950 Presence of cardiac pacemaker: Secondary | ICD-10-CM | POA: Diagnosis not present

## 2015-01-02 DIAGNOSIS — R339 Retention of urine, unspecified: Secondary | ICD-10-CM | POA: Diagnosis not present

## 2015-01-02 DIAGNOSIS — Z9181 History of falling: Secondary | ICD-10-CM | POA: Diagnosis not present

## 2015-01-02 DIAGNOSIS — E119 Type 2 diabetes mellitus without complications: Secondary | ICD-10-CM | POA: Diagnosis not present

## 2015-01-02 DIAGNOSIS — Z466 Encounter for fitting and adjustment of urinary device: Secondary | ICD-10-CM | POA: Diagnosis not present

## 2015-01-02 DIAGNOSIS — Z7982 Long term (current) use of aspirin: Secondary | ICD-10-CM | POA: Diagnosis not present

## 2015-01-02 DIAGNOSIS — Z7902 Long term (current) use of antithrombotics/antiplatelets: Secondary | ICD-10-CM | POA: Diagnosis not present

## 2015-01-02 DIAGNOSIS — I2510 Atherosclerotic heart disease of native coronary artery without angina pectoris: Secondary | ICD-10-CM | POA: Diagnosis not present

## 2015-01-02 DIAGNOSIS — H9193 Unspecified hearing loss, bilateral: Secondary | ICD-10-CM | POA: Diagnosis not present

## 2015-01-02 DIAGNOSIS — I10 Essential (primary) hypertension: Secondary | ICD-10-CM | POA: Diagnosis not present

## 2015-01-09 ENCOUNTER — Telehealth: Payer: Self-pay

## 2015-01-09 DIAGNOSIS — H9193 Unspecified hearing loss, bilateral: Secondary | ICD-10-CM | POA: Diagnosis not present

## 2015-01-09 DIAGNOSIS — I2510 Atherosclerotic heart disease of native coronary artery without angina pectoris: Secondary | ICD-10-CM | POA: Diagnosis not present

## 2015-01-09 DIAGNOSIS — Z7902 Long term (current) use of antithrombotics/antiplatelets: Secondary | ICD-10-CM | POA: Diagnosis not present

## 2015-01-09 DIAGNOSIS — R339 Retention of urine, unspecified: Secondary | ICD-10-CM | POA: Diagnosis not present

## 2015-01-09 DIAGNOSIS — Z466 Encounter for fitting and adjustment of urinary device: Secondary | ICD-10-CM | POA: Diagnosis not present

## 2015-01-09 DIAGNOSIS — Z7982 Long term (current) use of aspirin: Secondary | ICD-10-CM | POA: Diagnosis not present

## 2015-01-09 DIAGNOSIS — E119 Type 2 diabetes mellitus without complications: Secondary | ICD-10-CM | POA: Diagnosis not present

## 2015-01-09 DIAGNOSIS — I10 Essential (primary) hypertension: Secondary | ICD-10-CM | POA: Diagnosis not present

## 2015-01-09 DIAGNOSIS — Z950 Presence of cardiac pacemaker: Secondary | ICD-10-CM | POA: Diagnosis not present

## 2015-01-09 DIAGNOSIS — Z9181 History of falling: Secondary | ICD-10-CM | POA: Diagnosis not present

## 2015-01-09 NOTE — Telephone Encounter (Signed)
Andres [name] with [facility] left v/m requesting new order to continue going monthly to change pts indwelling catheter. Andres [name] request cb.

## 2015-01-10 NOTE — Telephone Encounter (Signed)
Informed Chipper OmanGeorgina to continue catheter changing indefinitely.

## 2015-01-10 NOTE — Telephone Encounter (Signed)
Yes, needs to continue this indefinitely

## 2015-01-24 NOTE — Telephone Encounter (Addendum)
Andres [name] with [facility] left v/m requesting verbal order to change catheter monthly. Pt recert ends on 01/26/15.spoke with Andres [name] and she said Andres [name] is no longer with [facility] and could not find order given on 01/09/15. Andres [name] notified as instructed from 01/10/2015 order. Andres [name] voiced understanding.

## 2015-01-26 DIAGNOSIS — Z7982 Long term (current) use of aspirin: Secondary | ICD-10-CM | POA: Diagnosis not present

## 2015-01-26 DIAGNOSIS — Z466 Encounter for fitting and adjustment of urinary device: Secondary | ICD-10-CM | POA: Diagnosis not present

## 2015-01-26 DIAGNOSIS — Z950 Presence of cardiac pacemaker: Secondary | ICD-10-CM | POA: Diagnosis not present

## 2015-01-26 DIAGNOSIS — E119 Type 2 diabetes mellitus without complications: Secondary | ICD-10-CM | POA: Diagnosis not present

## 2015-01-26 DIAGNOSIS — I10 Essential (primary) hypertension: Secondary | ICD-10-CM | POA: Diagnosis not present

## 2015-01-26 DIAGNOSIS — I2510 Atherosclerotic heart disease of native coronary artery without angina pectoris: Secondary | ICD-10-CM | POA: Diagnosis not present

## 2015-01-26 DIAGNOSIS — Z9181 History of falling: Secondary | ICD-10-CM | POA: Diagnosis not present

## 2015-01-26 DIAGNOSIS — Z7902 Long term (current) use of antithrombotics/antiplatelets: Secondary | ICD-10-CM | POA: Diagnosis not present

## 2015-01-26 DIAGNOSIS — R339 Retention of urine, unspecified: Secondary | ICD-10-CM | POA: Diagnosis not present

## 2015-01-26 DIAGNOSIS — H9193 Unspecified hearing loss, bilateral: Secondary | ICD-10-CM | POA: Diagnosis not present

## 2015-02-03 ENCOUNTER — Emergency Department: Payer: Self-pay | Admitting: Emergency Medicine

## 2015-02-03 DIAGNOSIS — Z466 Encounter for fitting and adjustment of urinary device: Secondary | ICD-10-CM | POA: Diagnosis not present

## 2015-02-03 DIAGNOSIS — R319 Hematuria, unspecified: Secondary | ICD-10-CM | POA: Diagnosis not present

## 2015-02-03 DIAGNOSIS — Z7982 Long term (current) use of aspirin: Secondary | ICD-10-CM | POA: Diagnosis not present

## 2015-02-03 DIAGNOSIS — Z7902 Long term (current) use of antithrombotics/antiplatelets: Secondary | ICD-10-CM | POA: Diagnosis not present

## 2015-02-03 DIAGNOSIS — R339 Retention of urine, unspecified: Secondary | ICD-10-CM | POA: Diagnosis not present

## 2015-02-03 DIAGNOSIS — N390 Urinary tract infection, site not specified: Secondary | ICD-10-CM | POA: Diagnosis not present

## 2015-02-03 DIAGNOSIS — I2510 Atherosclerotic heart disease of native coronary artery without angina pectoris: Secondary | ICD-10-CM | POA: Diagnosis not present

## 2015-02-03 DIAGNOSIS — I10 Essential (primary) hypertension: Secondary | ICD-10-CM | POA: Diagnosis not present

## 2015-02-03 DIAGNOSIS — E119 Type 2 diabetes mellitus without complications: Secondary | ICD-10-CM | POA: Diagnosis not present

## 2015-02-03 DIAGNOSIS — Z9181 History of falling: Secondary | ICD-10-CM | POA: Diagnosis not present

## 2015-02-03 DIAGNOSIS — Z87891 Personal history of nicotine dependence: Secondary | ICD-10-CM | POA: Diagnosis not present

## 2015-02-03 DIAGNOSIS — H9193 Unspecified hearing loss, bilateral: Secondary | ICD-10-CM | POA: Diagnosis not present

## 2015-02-03 DIAGNOSIS — Z950 Presence of cardiac pacemaker: Secondary | ICD-10-CM | POA: Diagnosis not present

## 2015-02-03 LAB — COMPREHENSIVE METABOLIC PANEL
ALBUMIN: 4 g/dL
ANION GAP: 15 (ref 7–16)
Alkaline Phosphatase: 111 U/L
BUN: 26 mg/dL — ABNORMAL HIGH
Bilirubin,Total: 0.6 mg/dL
CREATININE: 1.64 mg/dL — AB
Calcium, Total: 9.6 mg/dL
Chloride: 99 mmol/L — ABNORMAL LOW
Co2: 24 mmol/L
EGFR (African American): 43 — ABNORMAL LOW
GFR CALC NON AF AMER: 37 — AB
Glucose: 220 mg/dL — ABNORMAL HIGH
Potassium: 4.1 mmol/L
SGOT(AST): 31 U/L
SGPT (ALT): 14 U/L — ABNORMAL LOW
SODIUM: 138 mmol/L
Total Protein: 7.6 g/dL

## 2015-02-03 LAB — CBC
HCT: 37 % — ABNORMAL LOW (ref 40.0–52.0)
HGB: 12.2 g/dL — ABNORMAL LOW (ref 13.0–18.0)
MCH: 31.6 pg (ref 26.0–34.0)
MCHC: 32.9 g/dL (ref 32.0–36.0)
MCV: 96 fL (ref 80–100)
PLATELETS: 180 10*3/uL (ref 150–440)
RBC: 3.85 10*6/uL — ABNORMAL LOW (ref 4.40–5.90)
RDW: 13.4 % (ref 11.5–14.5)
WBC: 14.9 10*3/uL — ABNORMAL HIGH (ref 3.8–10.6)

## 2015-02-03 LAB — PROTIME-INR
INR: 1.1
Prothrombin Time: 13.9 secs

## 2015-02-04 ENCOUNTER — Telehealth: Payer: Self-pay

## 2015-02-04 NOTE — Telephone Encounter (Signed)
Please check on him today

## 2015-02-04 NOTE — Telephone Encounter (Signed)
Peggy nurse with [facility]; Peggy saw pt on 02/03/15 with blood in catheter tubing and bag;Pt also having abd pain and CP. TPR 99.8-apical pulse 98 and R 22. BP 150/80. Peggy directed pt to go to ED and pts son took pt to ED. Peggy wanted Dr Alphonsus SiasLetvak to be aware. Peggy did not know which ED pt was going to.

## 2015-02-04 NOTE — Telephone Encounter (Signed)
Okay 

## 2015-02-04 NOTE — Telephone Encounter (Signed)
Spoke with son, pt took his strap off that holds his tube, [facility], he is ok now and  is off aspirin and blood thinner for 2 days

## 2015-02-05 DIAGNOSIS — R339 Retention of urine, unspecified: Secondary | ICD-10-CM | POA: Diagnosis not present

## 2015-02-05 DIAGNOSIS — Z87440 Personal history of urinary (tract) infections: Secondary | ICD-10-CM | POA: Diagnosis not present

## 2015-02-15 ENCOUNTER — Ambulatory Visit: Payer: Medicare Other

## 2015-02-19 DIAGNOSIS — R339 Retention of urine, unspecified: Secondary | ICD-10-CM | POA: Diagnosis not present

## 2015-02-26 DIAGNOSIS — E119 Type 2 diabetes mellitus without complications: Secondary | ICD-10-CM | POA: Diagnosis not present

## 2015-02-26 DIAGNOSIS — Z950 Presence of cardiac pacemaker: Secondary | ICD-10-CM | POA: Diagnosis not present

## 2015-02-26 DIAGNOSIS — I2510 Atherosclerotic heart disease of native coronary artery without angina pectoris: Secondary | ICD-10-CM | POA: Diagnosis not present

## 2015-02-26 DIAGNOSIS — H9193 Unspecified hearing loss, bilateral: Secondary | ICD-10-CM | POA: Diagnosis not present

## 2015-02-26 DIAGNOSIS — Z7902 Long term (current) use of antithrombotics/antiplatelets: Secondary | ICD-10-CM | POA: Diagnosis not present

## 2015-02-26 DIAGNOSIS — Z7982 Long term (current) use of aspirin: Secondary | ICD-10-CM | POA: Diagnosis not present

## 2015-02-26 DIAGNOSIS — Z9181 History of falling: Secondary | ICD-10-CM | POA: Diagnosis not present

## 2015-02-26 DIAGNOSIS — I10 Essential (primary) hypertension: Secondary | ICD-10-CM | POA: Diagnosis not present

## 2015-02-26 DIAGNOSIS — R339 Retention of urine, unspecified: Secondary | ICD-10-CM | POA: Diagnosis not present

## 2015-02-26 DIAGNOSIS — Z466 Encounter for fitting and adjustment of urinary device: Secondary | ICD-10-CM | POA: Diagnosis not present

## 2015-02-26 NOTE — Consult Note (Signed)
Brief Consult Note: Diagnosis: NSTEMI.   Patient was seen by consultant.   Consult note dictated.   Comments: Agree with current management.  Cardiac cath tomorrow.  Echo to evaluate LVSF.  Electronic Signatures: Lorine BearsArida, Muhammad (MD)  (Signed 11-Nov-13 17:51)  Authored: Brief Consult Note   Last Updated: 11-Nov-13 17:51 by Lorine BearsArida, Muhammad (MD)

## 2015-02-26 NOTE — Discharge Summary (Signed)
PATIENT NAME:  Andres [name], Andres [name] MR#:  [id number] DATE OF BIRTH:  04-Apr-1926  DATE OF ADMISSION:  09/19/2012 DATE OF DISCHARGE:  09/22/2012  CONSULTANTS: Dr. Kirke CorinArida and Dr. Mariah MillingGollan from [facility].   CHIEF COMPLAINT:  Fall.   DISCHARGE DIAGNOSES:  1. Non-ST elevation myocardial infarction.  2. Hypertension.  3. Diabetes.  4. Acute on chronic renal failure.  5. Chronic constipation.  6. History of cerebrovascular accident with left carotid endarterectomy.  7. Anemia, which is chronic.  8. Hyperlipidemia.  9. Benign prostatic hypertrophy.   DISCHARGE MEDICATIONS:  1. Aspirin 81 mg daily.  2. Plavix 75 mg daily.  3. Tamsulosin 0.4 mg daily.  4. Isosorbide mononitrate 30 mg extended-release 1 tab daily.  5. Finasteride 5 mg once a day.  6. Acetaminophen 325 mg, 2 tabs every four hours as needed for pain.  7. Nitroglycerin sublingual 0.4 mg, 1 tab as needed for angina.  8. Atorvastatin 10 mg daily.  9. Metoprolol tartrate 25 mg twice a day.  10. Glipizide 2.5 mg extended-release 1 tab daily.   DIET: Low fat, low cholesterol, consistent carbohydrate diet.   ACTIVITY: As tolerated.   FOLLOWUP: Please follow up with primary care physician and cardiologist within 1 to 2 weeks and check a BMP within one week.   DISPOSITION: To rehab.   CODE STATUS: FULL CODE.   HISTORY OF PRESENT ILLNESS: For full details of the history and physical, please see the dictation on 09/19/2012 by Dr. Amado CoeGouru, but briefly this is a pleasant 81 year old male who presented status post fall and diffuse pain in the chest following the fall. The patient was noted to have elevated troponin and was admitted to the hospitalist service. The patient was thought to have bilateral patchy pneumonia as the initial x-ray showed bilateral infiltrate.   SIGNIFICANT LABS AND IMAGING: Initial creatinine 1.45, BNP is 6789, BUN 25 on arrival. Peak creatinine was 1.6. Initial troponin 0.09, then 4.4. Peak troponin was 4.8.  Last troponin 2.2. Initial WBC 9.6. Initial hemoglobin 12.8, last hemoglobin 9.4. Blood cultures: No growth to date.   Cardiac catheterization done on 11/12 showed severe three-vessel coronary artery disease with chronically occluded mid LAD, large dominant heavily calcified left circumflex with 95% ostial stenosis, occluded nondominant RCA.  The patient received a stent to the circumflex.   Chest, portable, one view on arrival showed diffusely increased interstitial markings likely reflecting interstitial edema. On 11/12 chest PA and lateral the same, no alveolar pneumonia.  Lumbar spine AP and lateral: No evidence of lumbar compression fracture.  Thoracic AP and lateral: Mild degenerative changes of the thoracic spine. No evidence of compression fractures.   Echocardiogram showing ejection fraction of 50 to 55%. Mild concentric left ventricular hypertrophy.  HOSPITAL COURSE: The patient was admitted to the hospitalist service. He was initially started on antibiotics for presumed pneumonia but had no significant fever or leukocytosis. The infiltrate possibly looked like mild interstitial edema. He was not hypoxic. Cardiology was consulted as his next troponin went up from 0.09 to 4.4. He was seen by cardiology and underwent a cardiac catheterization, the results of  which are above. He did receive a stent. He currently is on aspirin, Plavix, beta blocker, statin, and Imdur. He does not have any further chest pain. He did receive some IV fluids after the cardiac catheterization given history of chronic kidney disease, and he tolerated it well. The patient might have chronic systolic congestive heart failure, but he is not in acute congestive heart failure.  His ACE inhibitor and metformin will be held given the renal failure and he will be discharged to rehab.   ____________________________ Krystal Eaton, MD sa:bjt D: 09/22/2012 09:49:55 ET T: 09/22/2012 11:56:55 ET JOB#: [id number]  cc: Krystal Eaton, MD, <Dictator> Karie Schwalbe, MD Krystal Eaton MD ELECTRONICALLY SIGNED 09/27/2012 13:34

## 2015-02-26 NOTE — Consult Note (Signed)
PATIENT NAME:  Andres [name], Andres [name] MR#:  [id number] DATE OF BIRTH:  1926/07/05  DATE OF CONSULTATION:  09/19/2012  REFERRING PHYSICIAN:  Felipa Furnace, MD   CONSULTING PHYSICIAN:  Chelsea Aus. Kirke Corin, MD PRIMARY CARE PHYSICIAN: Tillman Abide, MD   REASON FOR CONSULTATION: Non-ST elevation myocardial infarction.   HISTORY OF PRESENT ILLNESS: This is an 81 year old male with past medical history of type 2 diabetes, hypertension, hyperlipidemia, benign prostatic hypertrophy status post prostate cancer, history of CVA with left-sided CEA in April of 2010 as well as permanent pacemaker placement many years ago. He also has chronic constipation. The patient was under Hospice care briefly for only two months, and his functional capacity improved significantly to the point where he was taken out of Hospice. He lives with his son who assists him with his daily affairs. He walks slowly with a walker. He started having chest pain about one week ago. This has been described as substernal tightness feeling mostly when he tries to walk. It gets better with activities. He started feeling weak with increased shortness of breath and fell while he was walking with a walker. He did not have any syncope. Last night, he continued to have chest discomfort which lasted for a few hours. Initial troponin was borderline elevated but subsequently went up to 4.2. He denies any cough or fever.   PAST MEDICAL HISTORY:  1. Hypertension.  2. Hyperlipidemia.  3. Type 2 diabetes.  4. Benign prostatic hypertrophy.  5. History of CVA with left-sided CEA.   6. Chronic constipation.  7. Status post permanent pacemaker placement, followed by Dr. Graciela Husbands.   PAST SURGICAL HISTORY:  1. Carotid endarterectomy.  2. Prostate surgery.  3. Pacemaker placement.  4. Appendectomy.   ALLERGIES: No known drug allergies.  HOME MEDICATIONS:  1. Tamsulosin 0.4 mg once daily.  2. Paxil 75 mg daily.  3. Omeprazole 20 mg twice daily.   4. Multivitamin once daily.  5. Metformin twice daily.  6. Imdur 30 mg daily.  7. Finasteride 5 mg once a day.  8. Benazepril 20 mg once a day.  9. Aspirin 81 mg once daily.   SOCIAL HISTORY: He lives at home with his son. He denies any smoking, alcohol or recreational drug use.   FAMILY HISTORY: Negative for premature coronary artery disease.   REVIEW OF SYSTEMS: A 10-point review of systems was performed. It is negative other than what is mentioned in the history of present illness.   PHYSICAL EXAMINATION:  GENERAL: This is an elderly gentleman who appears slightly older than his stated age. He is in no acute distress.   VITAL SIGNS: Temperature 98.3, pulse 77, respiratory rate 16, blood pressure 102/62, and oxygen saturation is 98% on 1 liter nasal cannula.   HEENT: Normocephalic, atraumatic.   NECK: No jugular venous distention or carotid bruits.   RESPIRATORY: Normal respiratory effort with diminished breath sounds at the base bilaterally.   CARDIOVASCULAR: Normal PMI. Normal S1 and S2 with no gallops or murmurs.   ABDOMEN: Benign, nontender, and nondistended.   EXTREMITIES: No clubbing, cyanosis, or edema.   SKIN: Warm and dry with no rash.  PSYCHIATRIC: He is alert, oriented x3 with normal mood and affect.   LABORATORY, DIAGNOSTIC AND RADIOLOGICAL DATA: His creatinine was slightly elevated at 1.45 with a BNP of 6789. Troponin initially was 0.09 and subsequently went up to 4.4. CK-MB was normal on presentation and increased to 21.1. Hemoglobin is 9.7 with a platelet count of 115. ECG shows a  ventricular paced rhythm.   IMPRESSION:  1. Non-ST elevation myocardial infarction.  2. Possible congestive heart failure.  3. Hypertension.  4. Chronic kidney disease.   RECOMMENDATIONS: The patient ruled in for myocardial infarction with elevated cardiac markers and history suggestive of non-ST elevation myocardial infarction preceded by  stuttering angina. He is currently on  aspirin, Plavix, metoprolol, atorvastatin. IV heparin was initiated. I had a prolonged discussion with the patient and his son about management options including proceeding with cardiac catheterization and possible coronary intervention versus medical therapy given his age and comorbidities. Both the patient and his son are concerned about recurrent ischemic event when he goes home. After discussing the advantages and disadvantages, they decided to proceed with cardiac catheterization. He will be scheduled for tomorrow. He is already being gently hydrated. I explained to them that given his age and comorbidities, complication rate is higher in his situation than an average cardiac catheterization.   ____________________________ Chelsea AusMuhammad A. Kirke CorinArida, MD maa:cbb D: 09/19/2012 17:59:06 ET T: 09/20/2012 09:35:09 ET JOB#: [id number]  cc: Jerolyn CenterMuhammad A. Kirke CorinArida, MD, <Dictator> Karie Schwalbeichard I. Letvak, MD Jerolyn CenterMUHAMMAD Argentina DonovanA Leshaun Biebel MD ELECTRONICALLY SIGNED 10/03/2012 8:49

## 2015-02-26 NOTE — H&P (Signed)
PATIENT NAME:  Andres [name], Andres [name] MR#:  [id number] DATE OF BIRTH:  1926-08-02  DATE OF ADMISSION:  09/19/2012  PRIMARY CARE PHYSICIAN: Dr. Tillman Abide   UROLOGIST: Dr. Anola Gurney   VASCULAR SURGEON: Dr. Festus Barren   CHIEF COMPLAINT: Fall.   HISTORY OF PRESENT ILLNESS: The patient is an 81 year old Caucasian male with a past medical history of diabetes mellitus, hypertension, hyperlipidemia, benign prostatic hypertrophy status post prostate surgery, history of CVA with left-sided CEA which was done in April 2010, status post pacemaker, and chronic constipation who is presenting to the ER with a chief complaint of fall and diffuse chest pain following fall. The patient is reporting that he usually walks with a walker and he was pretty steady while walking with a walker. Today while he was ambulating with the help of walker he fell and hurt his chest. The patient was having diffuse chest pain across his chest wall after he sustained a fall. Also, he was having back pain in the midback area. The patient is also reporting that he has been short of breath and coughing for the past one month. No family members were at bedside and the patient reported that he came to the ER via ambulance to figure out why he fell though he was ambulating with the help of walker. Here the patient's temperature is 98.9. Initial chest x-ray revealed patchy infiltrates bilaterally. The patient has received Rocephin and Zithromax IV and hospitalist team is called to admit the patient. The patient is hard of hearing but answering most of the questions appropriately. The patient's initial troponin is above normal at 0.09. 12 lead EKG did not reveal any changes when compared with old EKG. White count is normal at 9600. Creatinine is elevated at 1.45. BUN is at 25. The patient's previous creatinine in 2012 was 1.28 with a BUN of 14. The patient is reporting that his chest pain is much better during my examination. He is not quite sure  whether the nitro paste attached to the anterior chest wall is helping with the chest pain or not.   PAST MEDICAL HISTORY:  1. Hypertension. 2. Hyperlipidemia. 3. Diabetes mellitus, non-insulin-dependent. 4. Benign prostatic hypertrophy. 5. Chronic constipation. 6. History of CVA with left-sided CEA. 7. Status post pacemaker placement.   PAST SURGICAL HISTORY:  1. Left-sided carotid endarterectomy. 2. Prostate surgery for benign prostatic hypertrophy. 3. Pacemaker placement.  4. Left eye repair. 5. Appendectomy.   ALLERGIES: No known drug allergies.   HOME MEDICATIONS:  1. Tamsulosin 0.4 mg 1 capsule p.o. once daily.  2. Paxil 75 once daily. 3. Omeprazole 20 mg 1 capsule p.o. b.i.d.  4. Multivitamin 1 tablet p.o. once daily.  5. Metformin 500 p.o. b.i.d.  6. Isosorbide mononitrate 30 mg p.o. once a day. 7. Finasteride 5 mg once a day. 8. Benazepril 20 mg p.o. once a day. 9. Aspirin 81 mg p.o. once a day.   PSYCHOSOCIAL HISTORY: Lives at home with son. Denies smoking, alcohol, or illicit drug usage. According to old medical records, the patient is a Jehovah Witness.   FAMILY HISTORY: Mother died at age 74 of old age. Three brothers and two sisters are healthy.  REVIEW OF SYSTEMS: CONSTITUTIONAL: Denies fever or fatigue. Complaining of weakness. Denies any weight loss or weight gain. EYES: Denies any pain, redness, inflammation, glaucoma. ENT: Denies tinnitus or ear pain. Complaining of hearing loss and uses hearing aid. The patient is hard of hearing. Denies epistaxis, redness of the oropharynx, or difficulty swallowing.  RESPIRATORY: Complaining of cough, shortness of breath but denies hemoptysis. CARDIOVASCULAR: Complaining of chest pain across the chest wall. Denies orthopnea, edema, arrhythmia, palpitations, syncope. GI: Denies nausea, vomiting, diarrhea, abdominal pain, hematemesis, GERD, jaundice, rectal bleeding. GENITOURINARY: Complaining of difficulty in urination. Denies  any hematuria or incontinence. ENDOCRINE: Denies polyuria, polydipsia, thyroid problems. HEMATOLOGIC/LYMPHATIC: Denies easy bruising, bleeding, or swollen glands. MUSCULOSKELETAL: Complaining of midback pain, chest wall pain. Denies any neck pain, shoulder pain, knee pain, or hip pain. NEUROLOGIC: Complaining of generalized weakness. Denies dysarthria, epilepsy, vertigo, ataxia. PSYCH: Denies anxiety, insomnia, ADD, OCD.   PHYSICAL EXAMINATION:   VITAL SIGNS: Temperature 98.9, pulse 94, respiratory rate 20, blood pressure 160/76, pulse oximetry 96%.   GENERAL APPEARANCE: Not in acute distress. The patient is well built and well nourished, very hard of hearing, answering most of the questions appropriately.   HEENT: The patient is normocephalic, atraumatic. No conjunctival injection. No scleral icterus. Moist mucous membranes.   NECK: Supple. No JVD. Status post left-sided carotid artery endarterectomy.    LUNGS: Positive crackles. Moderate air entry. No wheezing.   CARDIAC: S1, S2 normal. Regular rate and rhythm. Positive anterior chest wall tenderness on palpation.   GI: Soft. Bowel sounds are positive in all four quadrants. Nontender, nondistended. No masses felt.   NEUROLOGIC: Awake, alert, and oriented x3. Cranial nerves II through XII are grossly intact. Motor and sensory are grossly intact.   EXTREMITIES: No edema. No cyanosis. No clubbing.   BACK: No vertebral or paravertebral tenderness is present but diffuse tenderness is present in the midback.   SKIN: No lesions. No rashes. No acne.   LABS AND IMAGING STUDIES: Chest x-ray has revealed diffuse patchy infiltrates bilaterally.   Glucose 190, sodium 140, potassium 4.2, chloride 102, CO2 23, BUN 25, creatinine 1.45, calcium 9.6, serum osmolality 289. Troponin-I 0.09. AST 13, ALT 14, WBC 9.6, hemoglobin 12.8, hematocrit 37.0, platelet count 155,000.  ASSESSMENT AND PLAN: This is an 81 year old male presenting to the ER after he  sustained a fall complaining of diffuse chest wall pain as well as midback pain. He will be admitted with the following assessment and plan:  1. Bilateral patchy pneumonia, probably community-acquired. Will admit to tele bed. IV Rocephin and Zithromax will be given. Sputum culture and sensitivities ordered.  2. Indeterminate troponin with no EKG changes. Will monitor the patient on telemetry. ACS protocol. Continue Imdur. Cycle cardiac biomarkers. If necessary, will consider Cardiology consult.  3. Status post fall. Will put in for a PT consult regarding gait evaluation. Will get a thoracolumbar spine x-ray. Pain management as needed.  4. History of diabetes mellitus, type II. Will hold metformin in view of acute renal insufficiency. Provide gentle hydration. The patient will be on insulin sliding scale.  5. Acute renal insufficiency. Will provide him gentle hydration with IV fluids and check a.m. labs. Avoid nephrotoxins. Will hold off on ACE inhibitor also.  6. Chronic history of constipation. Will provide laxative as needed.  7. History of CVA with left carotid endarterectomy. Continue aspirin and statin.   CODE STATUS: Will continue CODE STATUS as FULL CODE until it is clarified with the patient's son.  The diagnosis and plan was discussed with the patient. The patient voiced understanding of the plan.   TOTAL TIME SPENT ON ADMISSION INCLUDING COORDINATION OF CARE: 50 minutes.   ____________________________ Ramonita Lab, MD ag:drc D: 09/19/2012 01:51:40 ET T: 09/19/2012 08:06:29 ET JOB#: [id number]  cc: Ramonita Lab, MD, <Dictator> Karie Schwalbe, MD Ramonita Lab MD  ELECTRONICALLY SIGNED 10/01/2012 0:51

## 2015-03-01 NOTE — Discharge Summary (Signed)
PATIENT NAME:  Andres [name], Andres [name] MR#:  [id number] DATE OF BIRTH:  1926/07/22  DATE OF ADMISSION:  03/02/2013 DATE OF DISCHARGE:  03/06/2013  ADMITTING PHYSICIAN: Dr. Alford Highland.   DISCHARGING PHYSICIAN: Enid Baas, M.D.   PRIMARY CARE MD: Dr. Tillman Abide.   CONSULTATIONS IN THE HOSPITAL: Urology consultation with Dr. Anola Gurney.   DISCHARGE DIAGNOSES: 1.  Acute renal failure, secondary to post-obstructive causes, which improved now.  2.  Urinary retention which is chronic due to prostate-related problems, and will be discharged on a chronic Foley catheter.  3.  Coronary artery disease.  4.  History of cerebrovascular accident.  5.  Diabetes mellitus.  6.  Chronic obstructive pulmonary disease.  7.  Urinary tract infection, treated.  8.  Constipation.  DISCHARGE HOME MEDICATIONS: 1.  Finasteride 5 mg p.o. daily.  2.  Flomax 0.4 mg p.o. daily.  3.  Atorvastatin 10 mg p.o. daily.  4.  Aspirin 81 mg p.o. daily.  5.  Plavix 75 mg p.o. daily.  6.  Metoprolol 25 mg p.o. b.i.d.  7.  Imdur 30 mg p.o. daily.  8.  Multivitamin 1 tablet p.o. daily.  9.  Glipizide 5 mg p.o. in the morning.  10.  Albuterol inhaler 2 puffs every 6 hours.  11. Lactulose 30 mL twice a day, and advised to hold if greater than 2 bowel movements per day.  12. Glucerna Shake, chocolate-flavored twice a day with meals.   DISCHARGE DIET: Low-sodium, and ADA 1800 diet.   DISCHARGE ACTIVITY: As tolerated.    FOLLOWUP INSTRUCTIONS: 1.  PCP follow-up in 2 weeks.  2.  Long-term indwelling Foley catheter; monthly changes required. Follow up with Dr. Evelene Croon in the office in 4 to 6 weeks.  3.  Physical therapy.   LABS AND IMAGING STUDIES PRIOR TO DISCHARGE:  Blood cultures and urine cultures remain negative.   WBC 8.1, hemoglobin 11.8, hematocrit 34.2, platelet count 110.   Sodium 140, potassium 4.0, chloride 105, bicarb 27, BUN 22, creatinine 1.29, glucose 237 and calcium of 8.7. Sugars remained in  the 200 range while in the hospital.   HbA1c elevated at 7.6.   KUB showing unremarkable KUB, with normal bowel gas pattern.   Creatinine worsened at the time of admission up to 2.15, and WBC was 17.9 on admission.   CT of the abdomen and pelvis without contrast showing calcific pleural plaques along the right lung base, moderate-to-large amount of fecal retention and impaction noted, bilateral hydronephrosis and hydroureter with a dilated bladder showing bladder outlet obstruction.   BRIEF HOSPITAL COURSE: The patient is an 81 year old Caucasian man who is hard of hearing, past medical history significant for prostrate issues with chronic urinary retention with intermittent self-catheterizations at home in the past, hypertension, coronary artery disease, diabetes, history of strokes in the past, brought to the hospital secondary to generalized weakness and not feeling well.   He was found to be in urinary retention with hydronephrosis and hydrated, with bladder distention on admission along with acute renal failure.   1.  Acute renal failure, secondary to bilateral hydronephrosis from post-obstructive causes, urinary retention in the bladder:  He was hydrated with IV fluids gently, and also a Foley catheter was placed. His renal function improved by the time of discharge.  2.  Chronic urinary retention and prostate problems: He was following with Dr. Evelene Croon in the past and had procedures done in the past, and was doing intermittent self-catheterizations at home, which has been increasingly difficult for  him due to his age, so Dr. Wolff saw the patient and put him on a chroEvelene Croonnic Foley catheter for him, and no intervention at this time. He will need monthly Foley bag changed. Risk of UTI has been explained to the patient and the family. He will follow up with Dr. Evelene CroonWolff in the office in 4 to 6 weeks. The patient already on Flomax and also Proscar.  3.  Hypertension: He is on Imdur and metoprolol.  4.  Coronary artery disease: Stable while in the hospital. He is on aspirin, Imdur, and metoprolol, and also Plavix and a statin.  5.  History of CVA, on aspirin and Plavix at baseline.  6. UTI: He was treated with 4 days of Rocephin and 1 day of Levaquin while in the hospital. No complaints of dysuria at this time so he finished his antibiotics. His urine and blood cultures are negative.  7.  Constipation, with a lot of stool retention, stool impaction on the CT scan: He was placed on Dulcolax and Colace, milk of magnesia and also lactulose while in the hospital, and had several bowel movements here, so he is being discharged on lactulose to continue every day, and hold if greater than 2 bowel movements every day.    His course has been otherwise uneventful in the hospital. The patient did work with physical therapy and they recommended rehab at this time.   DISCHARGE CONDITION: Stable.   DISCHARGE DISPOSITION: Short-term rehab.   CODE STATUS: FULL CODE.   Time spent on discharge: Forty-five minutes.     ____________________________ Enid Baasadhika Karli Wickizer, MD rk:dm D: 03/06/2013 11:32:23 ET T: 03/06/2013 12:05:41 ET JOB#: [id number]  cc: Enid Baasadhika Dorota Heinrichs, MD, <Dictator> Enid BaasADHIKA Hendrik Donath MD ELECTRONICALLY SIGNED 03/09/2013 15:46

## 2015-03-01 NOTE — Consult Note (Signed)
Brief Consult Note: Diagnosis: Urinary retention. Fecal impaction.Diabetes.   Patient was seen by consultant.   Consult note dictated.   Recommend further assessment or treatment.   Discussed with Attending MD.   Comments: Patient's overall health has declined significantly since I last saw him in 2010. He may be at the point where an indwelling Foley with monthly changes per home health is his best option. He is no longer able to perform self-cath as he did when I last saw him. Urological surgery is not indicated. Medications will not be effective for the retention.  Electronic Signatures: Orson ApeWolff, Michael R (MD)  (Signed 24-Apr-14 15:57)  Authored: Brief Consult Note   Last Updated: 24-Apr-14 15:57 by Orson ApeWolff, Michael R (MD)

## 2015-03-01 NOTE — H&P (Signed)
PATIENT NAME:  Andres [name], Andres [name] MR#:  [id number] DATE OF BIRTH:  1926-05-07  DATE OF ADMISSION:  03/02/2013  PRIMARY CARE PHYSICIAN:  Tillman Abideichard Letvak, MD  CHIEF COMPLAINT: Feel terrible.   HISTORY OF PRESENT ILLNESS: This is an 81 year old man who feels terrible. He cannot walk. He is unable to concentrate, unable to think about what he is going to do. He was having trouble urinating and ended up coming to the ER yesterday and had a Foley catheter placed because a CT scan showed moderate to large amount of fecal retention and also bilateral hydronephrosis and hydroureter with a dilated bladder that raised concern of bladder outlet obstruction. He was sent home from the ER.  He is still having constipation, abdominal pain and low back pain. Today h vomited x 2.  In the ER, he was found to be in acute renal failure, worse than yesterday, possible urinary tract infection, and elevated white count. Hospitalist services were contacted for further evaluation. The patient is a poor historian. I did speak with the son on the phone. The patient is a FULL CODE and Jehovah's Witness.   PAST MEDICAL HISTORY: Diabetes. The patient states that he has a history of prostate cancer, also has a history of heart disease, hypertension, hyperlipidemia and stroke.   PAST SURGICAL HISTORY: Pacemaker, 2 prostate procedures, left carotid endarterectomy, appendectomy and left eye surgery.   ALLERGIES: No known drug allergies.   SOCIAL HISTORY: Quit smoking and drinking in 1970. Lives with his son. 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   FAMILY HISTORY: Mother died at 5890 of old age.  Unknown father family history.   MEDICATIONS: As per prescription writer. Included: 1.  Aspirin 81 mg daily. 2.  Atorvastatin 10 mg daily. 3.  Plavix 75 mg daily. 4.  Finasteride 5 mg daily. 5.  Glipizide extended release 2.5 mg daily. 6.  Imdur 30 mg daily. 7.  Metoprolol tartrate 25 mg twice a day. 8.  Multivitamin  daily. 9.  Flomax 0.4 mg daily.   REVIEW OF SYSTEMS: CONSTITUTIONAL: No fever, chills or sweats. Positive for headache, total body weakness. EARS, NOSE, MOUTH, AND THROAT: Positive for hearing loss, wears hearing aid in the left ear.  Positive from nasal congestion and occasional dysphagia.  EYES: No blurry vision. Left eye deviated out. CARDIOVASCULAR: No chest pain. No palpitations.  RESPIRATORY: Positive for coughing. No sputum. No hemoptysis.  GASTROINTESTINAL: Positive for nausea and vomiting x 2 today. Positive for lower abdominal pain. Positive for constipation. No bright red blood per rectum. No melena. GENITOURINARY: Positive for trouble urinating and Foley catheter placed.  INTEGUMENT: No rashes or eruptions.  PSYCHIATRIC: No anxiety or depression.  ENDOCRINE: No thyroid problems.  HEMATOLOGIC/LYMPHATIC: No anemia.   PHYSICAL EXAMINATION: VITAL SIGNS: Temperature 97.9, pulse 97, respirations 18, blood pressure 144/71 and pulse ox 97% on room air.  GENERAL: No respiratory distress.  EYES: Conjunctivae and lids normal. Pupils equal, round and reactive to light. Extraocular muscles intact. Left eye deviated out. EARS, NOSE, MOUTH, AND THROAT: Hearing aid in left ear. Nasal mucosa no erythema.  Throat no erythema. No exudate seen. Lips and gums no lesions.  NECK: No JVD. No bruits. No lymphadenopathy. No thyromegaly. No thyroid nodules palpated.  LUNGS: Clear to auscultation. No use of accessory muscles to breathe. No rhonchi, rales or wheeze heard.  HEART: S1, S2 normal. No gallops, rubs or murmurs heard. Carotid upstroke 2+ bilaterally. No bruits.  EXTREMITIES: Dorsalis pedis pulses 2+ bilaterally.  No edema of the lower extremities.  ABDOMEN: Soft. Positive tenderness in the lower abdomen. No organosplenomegaly. Normoactive bowel sounds. No masses felt.  LYMPHATIC: No lymph nodes in the neck.  MUSCULOSKELETAL: No clubbing, edema or cyanosis.  SKIN: No rashes or ulcers seen.   NEUROLOGIC: Cranial nerves II through XII grossly intact. Deep tendon reflexes 1+ bilateral lower extremities.  PSYCHIATRIC: The patient is oriented to person and place.   LABORATORY AND DIAGNOSTICS: Chest x-ray negative. CT scan of the head: Chronic involutional changes. No acute abnormalities.   Urinalysis: 3+ blood, 2+ ketones, 50 mg/dL of protein, trace leukocyte esterase, lots of red blood cells and white blood cells.   White count 17.5, H and H 13.0 and 38.3 and platelet count 128. Glucose 199, BUN 42, creatinine 2.15, sodium 135, potassium 4.2, chloride 99, CO2 18 and calcium 9.7. Liver function tests: AST elevated at 93.  GFR 27. Troponin negative.   CT scan of the abdomen and pelvis that was done yesterday showed moderate to large amount of fecal retention, bilateral hydronephrosis and hydroureter with a dilated bladder.   ASSESSMENT AND PLAN: 1.  Acute renal failure with urinary retention, abdominal pain and constipation. Foley catheter placed yesterday. We will give IV fluid hydration.  Avoid medications that can affect the kidney function. Will get urology see, but most likely they will recommend keeping catheter in for a week. The patient is already on medications for benign prostatic hypertrophy. Will treat constipation with Dulcolax suppositories and lactulose.  2.  Leukocytosis, possible urinary tract infection. We will give IV Rocephin, send off a urine culture.  Blood culture is already sent off.  3.  Hypertension. Continue metoprolol.  4.  Diabetes. Continue glipizide. We will also do sliding scale.  5.  Hyperlipidemia. Continue atorvastatin.  6.  History of cerebrovascular accident. On aspirin and also Plavix.  7.  History of coronary artery disease. On aspirin, Plavix and metoprolol.         8.  Weakness. Will get a physical therapy evaluation, probably secondary to acute renal failure.  TIME SPENT ON ADMISSION: 55 minutes.  ____________________________ Herschell Dimes.  Renae Gloss, MD rjw:sb D: 03/02/2013 13:18:47 ET T: 03/02/2013 13:27:52 ET JOB#: [id number]  cc: Herschell Dimes. Renae Gloss, MD, <Dictator> Karie Schwalbe, MD Salley Scarlet MD ELECTRONICALLY SIGNED 03/11/2013 12:58

## 2015-03-01 NOTE — Consult Note (Signed)
PATIENT NAME:  Andres [name], Andres [name] MR#:  [id number] DATE OF BIRTH:  03-25-1926  DATE OF CONSULTATION:  03/02/2013  REFERRING PHYSICIAN:  Alford Highlandichard Wieting, MD   CONSULTING PHYSICIAN:  Suszanne ConnersMichael R. Evelene CroonWolff, MD  REASON FOR CONSULTATION: Urinary retention.   HISTORY OF PRESENT ILLNESS: The patient is an 81 year old Caucasian male who presented to the Emergency Room with feeling poorly and was found to have fecal impaction and urinary retention. A catheter was placed. He does have a history of urinary retention probably secondary to his diabetes and declining health. I had last seen him back in 2010, and we recommended intermittent self-catheterization. He obviously is no longer able to perform this. He has had two prior prostate procedures including microwave thermal therapy and TURP, however, those procedures were performed greater than 5 years ago. He is a poor historian and is not able to give a voiding history.   ALLERGIES: No drug allergies.   CHRONIC MEDICATIONS: Include aspirin, atorvastatin, Plavix and finasteride.   PAST SURGICAL HISTORY: Previous surgical procedures include: 1.  Pacemaker. 2.  TURP. 3.  Carotid endarterectomy.  4.  Appendectomy.  5.  Eye surgery.   SOCIAL HISTORY: He quit smoking and drinking in 1970.   FAMILY HISTORY: Noncontributory.   PAST AND CURRENT MEDICAL CONDITIONS:  1.  Coronary artery disease.  2.  Hypertension.  3.  Hyperlipidemia.  4.  Stroke.  5.  Carotid vascular disease.  PHYSICAL EXAMINATION:  GENERAL:  A chronically ill, thin-appearing white male in no distress.  GASTROINTESTINAL:  Abdomen was soft.  GENITOURINARY: Foley catheter is in place. He is circumcised. Testes were atrophic.  RECTAL: A 15-gram flat, smooth, nontender prostate.   LABORATORY AND RADIOLOGICAL DATA:  BUN was 42 and creatinine 2.15 today.  CT scan dated April 23rd indicated bilateral hydronephrosis with a dilated bladder and fecal impaction.   IMPRESSION: 1.  Urinary  retention.  2.  Fecal impaction.   RECOMMENDATIONS:  The patient's overall health has declined significantly since I last saw him in the office in 2010. He may have reached the point where an indwelling Foley catheter with monthly changes per home health nursing is his best option. He is no longer able to perform intermittent self-catheterization as he did when I last saw him. Urological surgery is not indicated. Medications for urinary retention will most likely not be effective in this situation.  Further urological followup is probably not indicated.    ____________________________ Suszanne ConnersMichael R. Evelene CroonWolff, MD mrw:cb D: 03/02/2013 16:03:35 ET T: 03/02/2013 16:44:03 ET JOB#: [id number]  cc: Suszanne ConnersMichael R. Evelene CroonWolff, MD, <Dictator> Orson ApeMICHAEL R Emiliya Chretien MD ELECTRONICALLY SIGNED 03/03/2013 9:53

## 2015-03-12 ENCOUNTER — Telehealth: Payer: Self-pay | Admitting: Cardiology

## 2015-03-12 ENCOUNTER — Encounter: Payer: Medicare Other | Admitting: *Deleted

## 2015-03-12 NOTE — Telephone Encounter (Signed)
Spoke with pt and reminded pt of remote transmission that is due today. Pt verbalized understanding.   

## 2015-03-13 ENCOUNTER — Encounter: Payer: Self-pay | Admitting: Cardiology

## 2015-03-15 ENCOUNTER — Telehealth: Payer: Self-pay

## 2015-03-15 DIAGNOSIS — Z466 Encounter for fitting and adjustment of urinary device: Secondary | ICD-10-CM | POA: Diagnosis not present

## 2015-03-15 DIAGNOSIS — Z7902 Long term (current) use of antithrombotics/antiplatelets: Secondary | ICD-10-CM | POA: Diagnosis not present

## 2015-03-15 DIAGNOSIS — R339 Retention of urine, unspecified: Secondary | ICD-10-CM | POA: Diagnosis not present

## 2015-03-15 DIAGNOSIS — Z9181 History of falling: Secondary | ICD-10-CM | POA: Diagnosis not present

## 2015-03-15 DIAGNOSIS — Z7982 Long term (current) use of aspirin: Secondary | ICD-10-CM | POA: Diagnosis not present

## 2015-03-15 DIAGNOSIS — H9193 Unspecified hearing loss, bilateral: Secondary | ICD-10-CM | POA: Diagnosis not present

## 2015-03-15 DIAGNOSIS — I2510 Atherosclerotic heart disease of native coronary artery without angina pectoris: Secondary | ICD-10-CM | POA: Diagnosis not present

## 2015-03-15 DIAGNOSIS — I10 Essential (primary) hypertension: Secondary | ICD-10-CM | POA: Diagnosis not present

## 2015-03-15 DIAGNOSIS — E119 Type 2 diabetes mellitus without complications: Secondary | ICD-10-CM | POA: Diagnosis not present

## 2015-03-15 DIAGNOSIS — Z950 Presence of cardiac pacemaker: Secondary | ICD-10-CM | POA: Diagnosis not present

## 2015-03-15 NOTE — Telephone Encounter (Signed)
Heather nurse with [facility] left v/m requesting verbal order for nurse visit to ck an area at end of penis ? Boil like. The home health aide alerted Heather.Heather request cb. Nurse home health has been following pt with catheter care only at this point.

## 2015-03-15 NOTE — Telephone Encounter (Signed)
Left detailed message on voicemail of Heather with [facility].

## 2015-03-15 NOTE — Telephone Encounter (Signed)
Please give the order.  Thanks.   

## 2015-04-09 ENCOUNTER — Telehealth: Payer: Self-pay | Admitting: Internal Medicine

## 2015-04-09 DIAGNOSIS — R339 Retention of urine, unspecified: Secondary | ICD-10-CM

## 2015-04-09 NOTE — Telephone Encounter (Signed)
Orders placed.

## 2015-04-09 NOTE — Telephone Encounter (Signed)
[name] left v/m requesting order for [facility] to come back out to take care of catheter care; needs order for pt to have catheter changed; [name] request cb.

## 2015-04-09 NOTE — Telephone Encounter (Signed)
[phone].

## 2015-04-09 NOTE — Telephone Encounter (Signed)
Heather @ [facility] called She needs a whole new referral sent to them Fax # [phone] [facility] Herbert SetaHeather stated he is having problem with his cath  He was discharged from [facility] on 5/21 with no orders

## 2015-04-10 DIAGNOSIS — I5042 Chronic combined systolic (congestive) and diastolic (congestive) heart failure: Secondary | ICD-10-CM | POA: Diagnosis not present

## 2015-04-10 DIAGNOSIS — Z9181 History of falling: Secondary | ICD-10-CM | POA: Diagnosis not present

## 2015-04-10 DIAGNOSIS — R338 Other retention of urine: Secondary | ICD-10-CM | POA: Diagnosis not present

## 2015-04-10 DIAGNOSIS — I129 Hypertensive chronic kidney disease with stage 1 through stage 4 chronic kidney disease, or unspecified chronic kidney disease: Secondary | ICD-10-CM | POA: Diagnosis not present

## 2015-04-10 DIAGNOSIS — Z466 Encounter for fitting and adjustment of urinary device: Secondary | ICD-10-CM | POA: Diagnosis not present

## 2015-04-10 DIAGNOSIS — R2681 Unsteadiness on feet: Secondary | ICD-10-CM | POA: Diagnosis not present

## 2015-04-10 DIAGNOSIS — M6281 Muscle weakness (generalized): Secondary | ICD-10-CM | POA: Diagnosis not present

## 2015-04-10 DIAGNOSIS — I2510 Atherosclerotic heart disease of native coronary artery without angina pectoris: Secondary | ICD-10-CM | POA: Diagnosis not present

## 2015-04-10 DIAGNOSIS — Z7982 Long term (current) use of aspirin: Secondary | ICD-10-CM | POA: Diagnosis not present

## 2015-04-10 DIAGNOSIS — N401 Enlarged prostate with lower urinary tract symptoms: Secondary | ICD-10-CM | POA: Diagnosis not present

## 2015-04-10 DIAGNOSIS — E43 Unspecified severe protein-calorie malnutrition: Secondary | ICD-10-CM | POA: Diagnosis not present

## 2015-04-10 DIAGNOSIS — F0390 Unspecified dementia without behavioral disturbance: Secondary | ICD-10-CM | POA: Diagnosis not present

## 2015-04-10 DIAGNOSIS — N183 Chronic kidney disease, stage 3 (moderate): Secondary | ICD-10-CM | POA: Diagnosis not present

## 2015-04-10 DIAGNOSIS — I252 Old myocardial infarction: Secondary | ICD-10-CM | POA: Diagnosis not present

## 2015-04-10 DIAGNOSIS — M47816 Spondylosis without myelopathy or radiculopathy, lumbar region: Secondary | ICD-10-CM | POA: Diagnosis not present

## 2015-04-10 DIAGNOSIS — Z7902 Long term (current) use of antithrombotics/antiplatelets: Secondary | ICD-10-CM | POA: Diagnosis not present

## 2015-04-10 DIAGNOSIS — E1122 Type 2 diabetes mellitus with diabetic chronic kidney disease: Secondary | ICD-10-CM | POA: Diagnosis not present

## 2015-04-10 DIAGNOSIS — F329 Major depressive disorder, single episode, unspecified: Secondary | ICD-10-CM | POA: Diagnosis not present

## 2015-04-12 ENCOUNTER — Telehealth: Payer: Self-pay | Admitting: Internal Medicine

## 2015-04-12 NOTE — Telephone Encounter (Signed)
Discussed with him Unlikely to be something serious since nurse just changed the catheter 2 days ago and did not see anything worrisome. The pain had started before her visit Not sure if pain at tip of penis or deep in Not sick  Will try neosporin around tube for lubrication See next week if pain persists

## 2015-04-12 NOTE — Telephone Encounter (Signed)
son came by and is concerned about fathers penis after changing his catheter.  Pt is in pain and will not eat he feels so bad.  The home health nurse said taht it did not look infected but the pt is in pain and has been for over a week. Please cll son back at cell number thanks.

## 2015-04-29 DIAGNOSIS — R339 Retention of urine, unspecified: Secondary | ICD-10-CM | POA: Diagnosis not present

## 2015-05-03 ENCOUNTER — Telehealth: Payer: Self-pay

## 2015-05-03 DIAGNOSIS — I5042 Chronic combined systolic (congestive) and diastolic (congestive) heart failure: Secondary | ICD-10-CM | POA: Diagnosis not present

## 2015-05-03 DIAGNOSIS — R2681 Unsteadiness on feet: Secondary | ICD-10-CM | POA: Diagnosis not present

## 2015-05-03 DIAGNOSIS — F329 Major depressive disorder, single episode, unspecified: Secondary | ICD-10-CM | POA: Diagnosis not present

## 2015-05-03 DIAGNOSIS — I129 Hypertensive chronic kidney disease with stage 1 through stage 4 chronic kidney disease, or unspecified chronic kidney disease: Secondary | ICD-10-CM | POA: Diagnosis not present

## 2015-05-03 DIAGNOSIS — N401 Enlarged prostate with lower urinary tract symptoms: Secondary | ICD-10-CM | POA: Diagnosis not present

## 2015-05-03 DIAGNOSIS — I2510 Atherosclerotic heart disease of native coronary artery without angina pectoris: Secondary | ICD-10-CM | POA: Diagnosis not present

## 2015-05-03 DIAGNOSIS — Z7902 Long term (current) use of antithrombotics/antiplatelets: Secondary | ICD-10-CM | POA: Diagnosis not present

## 2015-05-03 DIAGNOSIS — N183 Chronic kidney disease, stage 3 (moderate): Secondary | ICD-10-CM | POA: Diagnosis not present

## 2015-05-03 DIAGNOSIS — E1122 Type 2 diabetes mellitus with diabetic chronic kidney disease: Secondary | ICD-10-CM | POA: Diagnosis not present

## 2015-05-03 DIAGNOSIS — M6281 Muscle weakness (generalized): Secondary | ICD-10-CM | POA: Diagnosis not present

## 2015-05-03 DIAGNOSIS — M47816 Spondylosis without myelopathy or radiculopathy, lumbar region: Secondary | ICD-10-CM | POA: Diagnosis not present

## 2015-05-03 DIAGNOSIS — F0390 Unspecified dementia without behavioral disturbance: Secondary | ICD-10-CM | POA: Diagnosis not present

## 2015-05-03 DIAGNOSIS — Z9181 History of falling: Secondary | ICD-10-CM | POA: Diagnosis not present

## 2015-05-03 DIAGNOSIS — Z7982 Long term (current) use of aspirin: Secondary | ICD-10-CM | POA: Diagnosis not present

## 2015-05-03 DIAGNOSIS — E43 Unspecified severe protein-calorie malnutrition: Secondary | ICD-10-CM | POA: Diagnosis not present

## 2015-05-03 DIAGNOSIS — I252 Old myocardial infarction: Secondary | ICD-10-CM | POA: Diagnosis not present

## 2015-05-03 DIAGNOSIS — R338 Other retention of urine: Secondary | ICD-10-CM | POA: Diagnosis not present

## 2015-05-03 DIAGNOSIS — Z466 Encounter for fitting and adjustment of urinary device: Secondary | ICD-10-CM | POA: Diagnosis not present

## 2015-05-03 NOTE — Telephone Encounter (Signed)
Ok to make change.  Agree with increased water intake and holding aspirin over weekend. Will forward to PCP as fyi.

## 2015-05-03 NOTE — Telephone Encounter (Signed)
Left detailed message on voicemail of Herbert Seta from [facility].

## 2015-05-03 NOTE — Telephone Encounter (Signed)
Heather with [facility] left v/m; pt fell last night; pts son found pt this morning. Catheter was pulled; Heather deflated balloon but there was a lot of blood in catheter bag; Heather changed catheter and urine was more clear but urine was still mixed with blood; pts son will hold ASA today to see if that would help stop bleeding. Advised son to encourage pt to drink water. Heather request order to change cath from 16 FR 10 cc balloon to 16 FR 30 cc balloon so catheter will stay in place better. Heather request cb. Dr Alphonsus Sias out of office.Please advise.

## 2015-05-04 NOTE — Telephone Encounter (Signed)
Please check on Monday May need to flush more often to clear blood---or check in with urologist

## 2015-05-06 NOTE — Telephone Encounter (Signed)
Left message for nurse to return my call and son.

## 2015-05-08 NOTE — Telephone Encounter (Signed)
Spoke with son and he states his father is doing ok after the fall, but he's not eating at all. Son asked him why and pt stated he just didn't feel like eating. Son also states something is still not right with the urine. I asked son if pt sees a urologist and he said he didn't know??  The son has called home health to get them back to look at the cathter again.

## 2015-05-08 NOTE — Telephone Encounter (Signed)
Will follow up as needed He does have urologist

## 2015-05-09 ENCOUNTER — Encounter: Payer: Self-pay | Admitting: Emergency Medicine

## 2015-05-09 ENCOUNTER — Inpatient Hospital Stay: Payer: Medicare Other

## 2015-05-09 ENCOUNTER — Inpatient Hospital Stay
Admission: EM | Admit: 2015-05-09 | Discharge: 2015-05-14 | DRG: 682 | Disposition: A | Discharge status: Skilled Nursing Facility | Payer: Medicare Other | Attending: Internal Medicine | Admitting: Internal Medicine

## 2015-05-09 DIAGNOSIS — E43 Unspecified severe protein-calorie malnutrition: Secondary | ICD-10-CM | POA: Diagnosis not present

## 2015-05-09 DIAGNOSIS — R634 Abnormal weight loss: Secondary | ICD-10-CM

## 2015-05-09 DIAGNOSIS — N183 Chronic kidney disease, stage 3 (moderate): Secondary | ICD-10-CM | POA: Diagnosis present

## 2015-05-09 DIAGNOSIS — E785 Hyperlipidemia, unspecified: Secondary | ICD-10-CM | POA: Diagnosis not present

## 2015-05-09 DIAGNOSIS — I5023 Acute on chronic systolic (congestive) heart failure: Secondary | ICD-10-CM | POA: Diagnosis not present

## 2015-05-09 DIAGNOSIS — D638 Anemia in other chronic diseases classified elsewhere: Secondary | ICD-10-CM | POA: Diagnosis present

## 2015-05-09 DIAGNOSIS — F0390 Unspecified dementia without behavioral disturbance: Secondary | ICD-10-CM | POA: Diagnosis present

## 2015-05-09 DIAGNOSIS — I252 Old myocardial infarction: Secondary | ICD-10-CM | POA: Diagnosis not present

## 2015-05-09 DIAGNOSIS — N400 Enlarged prostate without lower urinary tract symptoms: Secondary | ICD-10-CM | POA: Diagnosis present

## 2015-05-09 DIAGNOSIS — Z7982 Long term (current) use of aspirin: Secondary | ICD-10-CM | POA: Diagnosis not present

## 2015-05-09 DIAGNOSIS — K589 Irritable bowel syndrome without diarrhea: Secondary | ICD-10-CM | POA: Diagnosis not present

## 2015-05-09 DIAGNOSIS — R0902 Hypoxemia: Secondary | ICD-10-CM

## 2015-05-09 DIAGNOSIS — I5032 Chronic diastolic (congestive) heart failure: Secondary | ICD-10-CM | POA: Diagnosis not present

## 2015-05-09 DIAGNOSIS — Z9981 Dependence on supplemental oxygen: Secondary | ICD-10-CM

## 2015-05-09 DIAGNOSIS — Z833 Family history of diabetes mellitus: Secondary | ICD-10-CM | POA: Diagnosis not present

## 2015-05-09 DIAGNOSIS — I2510 Atherosclerotic heart disease of native coronary artery without angina pectoris: Secondary | ICD-10-CM | POA: Diagnosis present

## 2015-05-09 DIAGNOSIS — Z8673 Personal history of transient ischemic attack (TIA), and cerebral infarction without residual deficits: Secondary | ICD-10-CM

## 2015-05-09 DIAGNOSIS — E860 Dehydration: Secondary | ICD-10-CM | POA: Diagnosis not present

## 2015-05-09 DIAGNOSIS — Z66 Do not resuscitate: Secondary | ICD-10-CM | POA: Diagnosis not present

## 2015-05-09 DIAGNOSIS — E875 Hyperkalemia: Secondary | ICD-10-CM | POA: Diagnosis not present

## 2015-05-09 DIAGNOSIS — T8351XA Infection and inflammatory reaction due to indwelling urinary catheter, initial encounter: Secondary | ICD-10-CM | POA: Diagnosis not present

## 2015-05-09 DIAGNOSIS — N179 Acute kidney failure, unspecified: Principal | ICD-10-CM | POA: Diagnosis present

## 2015-05-09 DIAGNOSIS — E1121 Type 2 diabetes mellitus with diabetic nephropathy: Secondary | ICD-10-CM | POA: Diagnosis present

## 2015-05-09 DIAGNOSIS — R0602 Shortness of breath: Secondary | ICD-10-CM | POA: Diagnosis not present

## 2015-05-09 DIAGNOSIS — M479 Spondylosis, unspecified: Secondary | ICD-10-CM | POA: Diagnosis present

## 2015-05-09 DIAGNOSIS — K219 Gastro-esophageal reflux disease without esophagitis: Secondary | ICD-10-CM | POA: Diagnosis present

## 2015-05-09 DIAGNOSIS — K5730 Diverticulosis of large intestine without perforation or abscess without bleeding: Secondary | ICD-10-CM | POA: Diagnosis not present

## 2015-05-09 DIAGNOSIS — N170 Acute kidney failure with tubular necrosis: Secondary | ICD-10-CM | POA: Diagnosis not present

## 2015-05-09 DIAGNOSIS — I959 Hypotension, unspecified: Secondary | ICD-10-CM | POA: Diagnosis not present

## 2015-05-09 DIAGNOSIS — T83021A Displacement of indwelling urethral catheter, initial encounter: Secondary | ICD-10-CM | POA: Diagnosis present

## 2015-05-09 DIAGNOSIS — Z87891 Personal history of nicotine dependence: Secondary | ICD-10-CM | POA: Diagnosis not present

## 2015-05-09 DIAGNOSIS — Z681 Body mass index (BMI) 19 or less, adult: Secondary | ICD-10-CM

## 2015-05-09 DIAGNOSIS — R079 Chest pain, unspecified: Secondary | ICD-10-CM | POA: Diagnosis not present

## 2015-05-09 DIAGNOSIS — I129 Hypertensive chronic kidney disease with stage 1 through stage 4 chronic kidney disease, or unspecified chronic kidney disease: Secondary | ICD-10-CM | POA: Diagnosis present

## 2015-05-09 DIAGNOSIS — I5033 Acute on chronic diastolic (congestive) heart failure: Secondary | ICD-10-CM | POA: Diagnosis not present

## 2015-05-09 DIAGNOSIS — Z978 Presence of other specified devices: Secondary | ICD-10-CM

## 2015-05-09 DIAGNOSIS — Y846 Urinary catheterization as the cause of abnormal reaction of the patient, or of later complication, without mention of misadventure at the time of the procedure: Secondary | ICD-10-CM | POA: Diagnosis present

## 2015-05-09 DIAGNOSIS — M6281 Muscle weakness (generalized): Secondary | ICD-10-CM | POA: Diagnosis not present

## 2015-05-09 DIAGNOSIS — F0150 Vascular dementia without behavioral disturbance: Secondary | ICD-10-CM

## 2015-05-09 HISTORY — DX: Presence of urogenital implants: Z96.0

## 2015-05-09 HISTORY — DX: Presence of other specified devices: Z97.8

## 2015-05-09 LAB — URINALYSIS COMPLETE WITH MICROSCOPIC (ARMC ONLY)
BILIRUBIN URINE: NEGATIVE
GLUCOSE, UA: NEGATIVE mg/dL
Nitrite: NEGATIVE
PROTEIN: 30 mg/dL — AB
SPECIFIC GRAVITY, URINE: 1.01 (ref 1.005–1.030)
pH: 6 (ref 5.0–8.0)

## 2015-05-09 LAB — BASIC METABOLIC PANEL
Anion gap: 11 (ref 5–15)
BUN: 91 mg/dL — ABNORMAL HIGH (ref 6–20)
CHLORIDE: 106 mmol/L (ref 101–111)
CO2: 22 mmol/L (ref 22–32)
CREATININE: 4.05 mg/dL — AB (ref 0.61–1.24)
Calcium: 9.9 mg/dL (ref 8.9–10.3)
GFR calc Af Amer: 14 mL/min — ABNORMAL LOW (ref >60)
GFR calc non Af Amer: 12 mL/min — ABNORMAL LOW (ref >60)
Glucose, Bld: 109 mg/dL — ABNORMAL HIGH (ref 65–99)
Potassium: 6.9 mmol/L (ref 3.5–5.1)
SODIUM: 139 mmol/L (ref 135–145)

## 2015-05-09 LAB — CBC
HCT: 33.9 % — ABNORMAL LOW (ref 40.0–52.0)
HEMOGLOBIN: 11 g/dL — AB (ref 13.0–18.0)
MCH: 31.4 pg (ref 26.0–34.0)
MCHC: 32.4 g/dL (ref 32.0–36.0)
MCV: 97 fL (ref 80.0–100.0)
PLATELETS: 171 10*3/uL (ref 150–440)
RBC: 3.5 MIL/uL — ABNORMAL LOW (ref 4.40–5.90)
RDW: 14.4 % (ref 11.5–14.5)
WBC: 11.5 10*3/uL — AB (ref 3.8–10.6)

## 2015-05-09 LAB — TSH: TSH: 1.283 u[IU]/mL (ref 0.350–4.500)

## 2015-05-09 LAB — COMPREHENSIVE METABOLIC PANEL
ALT: 14 U/L — AB (ref 17–63)
AST: 20 U/L (ref 15–41)
Albumin: 3.6 g/dL (ref 3.5–5.0)
Alkaline Phosphatase: 87 U/L (ref 38–126)
Anion gap: 11 (ref 5–15)
BILIRUBIN TOTAL: 0.6 mg/dL (ref 0.3–1.2)
BUN: 83 mg/dL — ABNORMAL HIGH (ref 6–20)
CALCIUM: 9.7 mg/dL (ref 8.9–10.3)
CO2: 20 mmol/L — AB (ref 22–32)
Chloride: 111 mmol/L (ref 101–111)
Creatinine, Ser: 3.62 mg/dL — ABNORMAL HIGH (ref 0.61–1.24)
GFR calc non Af Amer: 14 mL/min — ABNORMAL LOW (ref >60)
GFR, EST AFRICAN AMERICAN: 16 mL/min — AB (ref >60)
Glucose, Bld: 151 mg/dL — ABNORMAL HIGH (ref 65–99)
Potassium: 6.2 mmol/L — ABNORMAL HIGH (ref 3.5–5.1)
Sodium: 142 mmol/L (ref 135–145)
Total Protein: 7.3 g/dL (ref 6.5–8.1)

## 2015-05-09 LAB — HEMOGLOBIN A1C: Hgb A1c MFr Bld: 6.4 % — ABNORMAL HIGH (ref 4.0–6.0)

## 2015-05-09 LAB — GLUCOSE, CAPILLARY
GLUCOSE-CAPILLARY: 191 mg/dL — AB (ref 65–99)
Glucose-Capillary: 102 mg/dL — ABNORMAL HIGH (ref 65–99)
Glucose-Capillary: 250 mg/dL — ABNORMAL HIGH (ref 65–99)

## 2015-05-09 LAB — POTASSIUM: Potassium: 6.4 mmol/L — ABNORMAL HIGH (ref 3.5–5.1)

## 2015-05-09 MED ORDER — DEXTROSE 50 % IV SOLN
INTRAVENOUS | Status: AC
  Administered 2015-05-09: 25 mL via INTRAVENOUS
  Filled 2015-05-09: qty 50

## 2015-05-09 MED ORDER — SODIUM POLYSTYRENE SULFONATE 15 GM/60ML PO SUSP
ORAL | Status: AC
  Administered 2015-05-09: 30 g via ORAL
  Filled 2015-05-09: qty 60

## 2015-05-09 MED ORDER — INSULIN ASPART 100 UNIT/ML ~~LOC~~ SOLN
10.0000 [IU] | Freq: Once | SUBCUTANEOUS | Status: AC
  Administered 2015-05-09: 22:00:00 10 [IU] via INTRAVENOUS
  Filled 2015-05-09: qty 10

## 2015-05-09 MED ORDER — ENOXAPARIN SODIUM 30 MG/0.3ML ~~LOC~~ SOLN: 30.0000 mg | SUBCUTANEOUS | Status: DC

## 2015-05-09 MED ORDER — CALCIUM GLUCONATE 10 % IV SOLN
1.0000 g | Freq: Once | INTRAVENOUS | Status: AC
  Administered 2015-05-09: 1 g via INTRAVENOUS

## 2015-05-09 MED ORDER — FINASTERIDE 5 MG PO TABS
5.0000 mg | ORAL_TABLET | Freq: Every day | ORAL | Status: DC
  Administered 2015-05-09 – 2015-05-14 (×6): 5 mg via ORAL
  Filled 2015-05-09 (×6): qty 1

## 2015-05-09 MED ORDER — DEXTROSE 50 % IV SOLN
25.0000 mL | Freq: Once | INTRAVENOUS | Status: AC
  Administered 2015-05-09: 25 mL via INTRAVENOUS

## 2015-05-09 MED ORDER — ACETAMINOPHEN 325 MG PO TABS
650.0000 mg | ORAL_TABLET | Freq: Four times a day (QID) | ORAL | Status: DC | PRN
  Administered 2015-05-12: 10:00:00 650 mg via ORAL
  Filled 2015-05-09 (×2): qty 2

## 2015-05-09 MED ORDER — SODIUM BICARBONATE 8.4 % IV SOLN
50.0000 meq | Freq: Once | INTRAVENOUS | Status: AC
  Administered 2015-05-09: 22:00:00 50 meq via INTRAVENOUS
  Filled 2015-05-09: qty 50

## 2015-05-09 MED ORDER — SODIUM POLYSTYRENE SULFONATE 15 GM/60ML PO SUSP
30.0000 g | ORAL | Status: AC
  Administered 2015-05-09: 30 g via ORAL
  Filled 2015-05-09: qty 120

## 2015-05-09 MED ORDER — ADULT MULTIVITAMIN W/MINERALS CH
1.0000 | ORAL_TABLET | Freq: Every day | ORAL | Status: DC
  Administered 2015-05-09 – 2015-05-14 (×6): 1 via ORAL
  Filled 2015-05-09 (×6): qty 1

## 2015-05-09 MED ORDER — ACETAMINOPHEN 650 MG RE SUPP: 650.0000 mg | Freq: Four times a day (QID) | RECTAL | Status: DC | PRN

## 2015-05-09 MED ORDER — SODIUM POLYSTYRENE SULFONATE 15 GM/60ML PO SUSP
ORAL | Status: AC
  Filled 2015-05-09: qty 60

## 2015-05-09 MED ORDER — ENSURE ENLIVE PO LIQD
237.0000 mL | Freq: Three times a day (TID) | ORAL | Status: DC
  Administered 2015-05-09 – 2015-05-14 (×11): 237 mL via ORAL

## 2015-05-09 MED ORDER — SODIUM BICARBONATE 8.4 % IV SOLN
INTRAVENOUS | Status: AC
  Administered 2015-05-09: 50 meq via INTRAVENOUS
  Filled 2015-05-09: qty 50

## 2015-05-09 MED ORDER — SODIUM POLYSTYRENE SULFONATE 15 GM/60ML PO SUSP
30.0000 g | Freq: Once | ORAL | Status: AC
  Administered 2015-05-09: 30 g via ORAL

## 2015-05-09 MED ORDER — SODIUM CHLORIDE 0.9 % IV SOLN
INTRAVENOUS | Status: DC
  Administered 2015-05-09 (×2): via INTRAVENOUS

## 2015-05-09 MED ORDER — DOCUSATE SODIUM 100 MG PO CAPS
100.0000 mg | ORAL_CAPSULE | Freq: Two times a day (BID) | ORAL | Status: DC
  Administered 2015-05-09 – 2015-05-14 (×9): 100 mg via ORAL
  Filled 2015-05-09 (×9): qty 1

## 2015-05-09 MED ORDER — BOOST HIGH PROTEIN PO LIQD
1.0000 | Freq: Two times a day (BID) | ORAL | Status: DC
  Administered 2015-05-09: 237 mL via ORAL
  Filled 2015-05-09: qty 237

## 2015-05-09 MED ORDER — ASPIRIN EC 81 MG PO TBEC
81.0000 mg | DELAYED_RELEASE_TABLET | Freq: Every day | ORAL | Status: DC
  Administered 2015-05-09 – 2015-05-14 (×5): 81 mg via ORAL
  Filled 2015-05-09 (×5): qty 1

## 2015-05-09 MED ORDER — CLOPIDOGREL BISULFATE 75 MG PO TABS
75.0000 mg | ORAL_TABLET | Freq: Every day | ORAL | Status: DC
  Administered 2015-05-09 – 2015-05-14 (×6): 75 mg via ORAL
  Filled 2015-05-09 (×6): qty 1

## 2015-05-09 MED ORDER — METOPROLOL TARTRATE 25 MG PO TABS
25.0000 mg | ORAL_TABLET | Freq: Two times a day (BID) | ORAL | Status: DC
  Administered 2015-05-09 – 2015-05-10 (×3): 25 mg via ORAL
  Filled 2015-05-09 (×5): qty 1

## 2015-05-09 MED ORDER — ALBUTEROL SULFATE (2.5 MG/3ML) 0.083% IN NEBU
INHALATION_SOLUTION | RESPIRATORY_TRACT | Status: AC
  Administered 2015-05-09: 5 mg via RESPIRATORY_TRACT
  Filled 2015-05-09: qty 3

## 2015-05-09 MED ORDER — SODIUM CHLORIDE 0.9 % IV BOLUS (SEPSIS)
1000.0000 mL | Freq: Once | INTRAVENOUS | Status: AC
  Administered 2015-05-09: 1000 mL via INTRAVENOUS

## 2015-05-09 MED ORDER — OXYCODONE HCL 5 MG PO TABS
5.0000 mg | ORAL_TABLET | ORAL | Status: DC | PRN
  Administered 2015-05-10 – 2015-05-13 (×3): 5 mg via ORAL
  Filled 2015-05-09 (×3): qty 1

## 2015-05-09 MED ORDER — ONDANSETRON HCL 4 MG PO TABS: 4.0000 mg | ORAL_TABLET | Freq: Four times a day (QID) | ORAL | Status: DC | PRN

## 2015-05-09 MED ORDER — SODIUM BICARBONATE 8.4 % IV SOLN
INTRAVENOUS | Status: AC
  Filled 2015-05-09: qty 50

## 2015-05-09 MED ORDER — CITALOPRAM HYDROBROMIDE 10 MG PO TABS
10.0000 mg | ORAL_TABLET | Freq: Every day | ORAL | Status: DC
  Administered 2015-05-09 – 2015-05-14 (×6): 10 mg via ORAL
  Filled 2015-05-09 (×6): qty 1

## 2015-05-09 MED ORDER — SODIUM CHLORIDE 0.9 % IJ SOLN
3.0000 mL | Freq: Two times a day (BID) | INTRAMUSCULAR | Status: DC
  Administered 2015-05-09 – 2015-05-14 (×7): 3 mL via INTRAVENOUS

## 2015-05-09 MED ORDER — ALBUTEROL SULFATE (2.5 MG/3ML) 0.083% IN NEBU
2.5000 mg | INHALATION_SOLUTION | RESPIRATORY_TRACT | Status: DC | PRN
  Filled 2015-05-09: qty 3

## 2015-05-09 MED ORDER — ALBUTEROL SULFATE (2.5 MG/3ML) 0.083% IN NEBU
INHALATION_SOLUTION | RESPIRATORY_TRACT | Status: AC
  Filled 2015-05-09: qty 3

## 2015-05-09 MED ORDER — FLUTICASONE PROPIONATE 50 MCG/ACT NA SUSP
2.0000 | Freq: Every day | NASAL | Status: DC
  Administered 2015-05-09 – 2015-05-11 (×3): 2 via NASAL
  Filled 2015-05-09: qty 16

## 2015-05-09 MED ORDER — INSULIN ASPART 100 UNIT/ML ~~LOC~~ SOLN
10.0000 [IU] | Freq: Once | SUBCUTANEOUS | Status: AC
  Administered 2015-05-09: 10 [IU] via INTRAVENOUS

## 2015-05-09 MED ORDER — ALBUTEROL SULFATE (2.5 MG/3ML) 0.083% IN NEBU
5.0000 mg | INHALATION_SOLUTION | Freq: Once | RESPIRATORY_TRACT | Status: AC
  Administered 2015-05-09: 5 mg via RESPIRATORY_TRACT

## 2015-05-09 MED ORDER — ATORVASTATIN CALCIUM 10 MG PO TABS
10.0000 mg | ORAL_TABLET | Freq: Every day | ORAL | Status: DC
  Administered 2015-05-09 – 2015-05-13 (×5): 10 mg via ORAL
  Filled 2015-05-09 (×5): qty 1

## 2015-05-09 MED ORDER — INSULIN ASPART 100 UNIT/ML ~~LOC~~ SOLN
0.0000 [IU] | Freq: Three times a day (TID) | SUBCUTANEOUS | Status: DC
  Administered 2015-05-09 – 2015-05-10 (×2): 2 [IU] via SUBCUTANEOUS
  Administered 2015-05-10: 5 [IU] via SUBCUTANEOUS
  Administered 2015-05-10: 18:00:00 2 [IU] via SUBCUTANEOUS
  Administered 2015-05-11: 08:00:00 7 [IU] via SUBCUTANEOUS
  Administered 2015-05-11 – 2015-05-12 (×3): 3 [IU] via SUBCUTANEOUS
  Administered 2015-05-12: 13:00:00 7 [IU] via SUBCUTANEOUS
  Administered 2015-05-13: 12:00:00 2 [IU] via SUBCUTANEOUS
  Administered 2015-05-13 – 2015-05-14 (×2): 5 [IU] via SUBCUTANEOUS
  Filled 2015-05-09: qty 3
  Filled 2015-05-09 (×3): qty 2
  Filled 2015-05-09: qty 3
  Filled 2015-05-09: qty 5
  Filled 2015-05-09: qty 7
  Filled 2015-05-09: qty 5
  Filled 2015-05-09: qty 2
  Filled 2015-05-09: qty 3
  Filled 2015-05-09: qty 7
  Filled 2015-05-09: qty 5

## 2015-05-09 MED ORDER — INSULIN ASPART 100 UNIT/ML ~~LOC~~ SOLN
SUBCUTANEOUS | Status: AC
  Administered 2015-05-09: 10 [IU] via INTRAVENOUS
  Filled 2015-05-09: qty 10

## 2015-05-09 MED ORDER — ONDANSETRON HCL 4 MG/2ML IJ SOLN
4.0000 mg | Freq: Four times a day (QID) | INTRAMUSCULAR | Status: DC | PRN
  Administered 2015-05-11 – 2015-05-12 (×2): 4 mg via INTRAVENOUS
  Filled 2015-05-09 (×2): qty 2

## 2015-05-09 MED ORDER — HEPARIN SODIUM (PORCINE) 5000 UNIT/ML IJ SOLN
5000.0000 [IU] | Freq: Three times a day (TID) | INTRAMUSCULAR | Status: DC
  Administered 2015-05-09 – 2015-05-14 (×14): 5000 [IU] via SUBCUTANEOUS
  Filled 2015-05-09 (×13): qty 1

## 2015-05-09 MED ORDER — DEXTROSE 50 % IV SOLN
25.0000 mL | Freq: Once | INTRAVENOUS | Status: AC
  Administered 2015-05-09: 22:00:00 25 mL via INTRAVENOUS
  Filled 2015-05-09: qty 50

## 2015-05-09 MED ORDER — SODIUM BICARBONATE 8.4 % IV SOLN
50.0000 meq | Freq: Once | INTRAVENOUS | Status: AC
  Administered 2015-05-09: 50 meq via INTRAVENOUS

## 2015-05-09 MED ORDER — ISOSORBIDE MONONITRATE ER 30 MG PO TB24
30.0000 mg | ORAL_TABLET | Freq: Every day | ORAL | Status: DC
  Administered 2015-05-09 – 2015-05-10 (×2): 30 mg via ORAL
  Filled 2015-05-09 (×2): qty 1

## 2015-05-09 MED ORDER — SODIUM CHLORIDE 0.9 % IV BOLUS (SEPSIS)
1000.0000 mL | Freq: Once | INTRAVENOUS | Status: AC
Start: 2015-05-09 — End: 2015-05-09
  Administered 2015-05-09: 1000 mL via INTRAVENOUS

## 2015-05-09 MED ORDER — CALCIUM GLUCONATE 10 % IV SOLN
INTRAVENOUS | Status: AC
  Administered 2015-05-09: 1 g via INTRAVENOUS
  Filled 2015-05-09: qty 10

## 2015-05-09 NOTE — Plan of Care (Signed)
Problem: Discharge Progression Outcomes Goal: Discharge plan in place and appropriate Outcome: Progressing Patient goes by Andres [name]  Patient is very Hard of Hearing  Patient is a chronic Foley     Goal: Pain controlled with appropriate interventions Outcome: Progressing Patient has some minor oral pain eating regular diet changed to puree by speech with no pain  Goal: Hemodynamically stable Patient VSS, Potassium has come down from 6.9 to 6.2 Kaxyexlate given  Goal: Complications resolved/controlled Outcome: Progressing Patient is tolerating puree diet well, ABD ultrasound done this shift Goal: Tolerating diet Outcome: Progressing Patient is tolerating puree diet well  Goal: Activity appropriate for discharge plan Outcome: Progressing Pt worked with patient

## 2015-05-09 NOTE — ED Notes (Signed)
Awaiting urine production from foley; clamped at this time, continue to monitor for production of urine to collect for sample. Pt moved up in bed.

## 2015-05-09 NOTE — ED Notes (Signed)
Patient transported to Ultrasound 

## 2015-05-09 NOTE — Progress Notes (Signed)
On call MD notified pt potassium increased from 6.2 to 6.4 despite having 60g of Kayexalate earlier in the day and multiple BM. MD to place new orders. Will continue to assess.   Andres StagersSkyler Raymie [name] [employer]

## 2015-05-09 NOTE — ED Notes (Addendum)
Pt brought in by son who patient lives with. Son states that pt has been increasingly weak X 2 weeks, decreased appetite, sore in mouth from dentures that originally caused decrease in eating; unsure why patient is not eating now. Fall X 1 month ago. Mentality at baseline, a&o X 4 at this time. Pt arrives with foley that was changed X 1 week ago, foley X 1 year per family; home health nurse changes-foul smell to urine, cloudy and amber in bag, few small clots in bag.

## 2015-05-09 NOTE — ED Notes (Signed)
EKG

## 2015-05-09 NOTE — H&P (Signed)
[facility] [place] at [facility]   PATIENT NAME: Andres [name]    MR#:  [id number]  DATE OF BIRTH:  [birth year]  DATE OF ADMISSION:  05/09/2015  PRIMARY CARE PHYSICIAN: Tillman Abide, MD   REQUESTING/REFERRING PHYSICIAN: Governor Rooks MD(ED)  CHIEF COMPLAINT:   Chief Complaint  Patient presents with  . Weakness    HISTORY OF PRESENT ILLNESS:  Andres [name]  is a 81 y.o. male with a known history of diabetes, hypertension, CAD, dementia presents to the emergency room brought in by his son for decreased oral intake, dehydration. Patient initially developed an ulcer in his mouth from his dentures which caused decreased oral intake. Even after the ulcer has healed patient has continued to not eat well which prompted his son to bring the patient to the hospital. Here patient's main complaint is some mild abdominal pain which is chronic per his son. No complaints of trouble swallowing. No shortness of breath or vomiting or diarrhea. No fever. He has been found to have acute renal failure with creatinine of 4.05 with potassium of 6.9 and is being admitted to the hospitalist service. He has a chronic indwelling Foley catheter due to benign prostatic hypertrophy.  PAST MEDICAL HISTORY:   Past Medical History  Diagnosis Date  . CAD (coronary artery disease)   . Diverticulitis   . GERD (gastroesophageal reflux disease)   . Hyperlipidemia   . Hypertension   . BPH (benign prostatic hypertrophy)   . Diabetes mellitus   . DJD (degenerative joint disease)     lumbar spine  . IBS (irritable bowel syndrome)   . Stroke   . Depression 2012  . Acute MI 09/2012  . Chronic kidney disease     ACUTE RENAL FAILURE  . Foley catheter in place 05/09/15    for 1 year per family    PAST SURGICAL HISTORY:   Past Surgical History  Procedure Laterality Date  . Angioplasty  1995  . Doppler echocardiography  11/1999    syncope  . Pacemaker insertion      Pacer/cath  (ef  65%)  1/01 -  guidant Discovery II DR pulse generator  . Prostate surgery  07/2003  . Esophagogastroduodenoscopy  07/2005    neg  . Cardiac catheterization  09/2012    NSTEMI/STENT PLACED  . Insert / replace / remove pacemaker      [id number]    SOCIAL HISTORY:   History  Substance Use Topics  . Smoking status: Former Games developer  . Smokeless tobacco: Never Used  . Alcohol Use: No     Comment: heavy in the past    FAMILY HISTORY:   Family History  Problem Relation Age of Onset  . Diabetes Sister     DRUG ALLERGIES:  No Known Allergies  REVIEW OF SYSTEMS:   Review of Systems  Constitutional: Positive for weight loss and malaise/fatigue. Negative for fever and chills.  HENT: Positive for hearing loss. Negative for nosebleeds.   Eyes: Negative for blurred vision, double vision and pain.  Respiratory: Negative for cough, hemoptysis, sputum production, shortness of breath and wheezing.   Cardiovascular: Negative for chest pain, palpitations, orthopnea and leg swelling.  Gastrointestinal: Positive for abdominal pain. Negative for nausea, vomiting, diarrhea and constipation.  Genitourinary: Negative for dysuria and hematuria.  Musculoskeletal: Negative for myalgias, back pain and falls.  Skin: Negative for rash.  Neurological: Positive for weakness. Negative for dizziness, tremors, sensory change, speech change, focal weakness, seizures and headaches.  Endo/Heme/Allergies: Does  not bruise/bleed easily.  Psychiatric/Behavioral: Positive for memory loss. Negative for depression. The patient is not nervous/anxious.     MEDICATIONS AT HOME:   Prior to Admission medications   Medication Sig Start Date End Date Taking? Authorizing Provider  aspirin 81 MG tablet Take 81 mg by mouth daily.     Yes Historical Provider, MD  atorvastatin (LIPITOR) 10 MG tablet Take 1 tablet by mouth  daily 10/11/14  Yes Karie Schwalbeichard I Letvak, MD  citalopram (CELEXA) 10 MG tablet Take 1 tablet by mouth  daily  12/31/14  Yes Karie Schwalbeichard I Letvak, MD  clopidogrel (PLAVIX) 75 MG tablet Take 1 tablet by mouth  daily 10/11/14  Yes Karie Schwalbeichard I Letvak, MD  docusate sodium (COLACE) 100 MG capsule Take 100 mg by mouth 2 (two) times daily.   Yes Historical Provider, MD  finasteride (PROSCAR) 5 MG tablet Take 1 tablet by mouth  daily 10/11/14  Yes Karie Schwalbeichard I Letvak, MD  fluticasone [facility](FLONASE) 50 MCG/ACT nasal spray Place 2 sprays into the nose daily. 05/10/13  Yes Karie Schwalbeichard I Letvak, MD  GLIPIZIDE XL 5 MG 24 hr tablet Take 1 tablet (5 mg total)  by mouth daily. 06/20/14  Yes Karie Schwalbeichard I Letvak, MD  isosorbide mononitrate (IMDUR) 30 MG 24 hr tablet Take 1 tablet by mouth  daily 10/11/14  Yes Karie Schwalbeichard I Letvak, MD  lisinopril (PRINIVIL,ZESTRIL) 5 MG tablet Take 1 tablet by mouth  daily 06/20/14  Yes Karie Schwalbeichard I Letvak, MD  metFORMIN (GLUCOPHAGE) 1000 MG tablet Take one-half tablet by  mouth two times daily with  meals 06/20/14  Yes Karie Schwalbeichard I Letvak, MD  metoprolol tartrate (LOPRESSOR) 25 MG tablet Take 1 tablet by mouth  twice a day 12/31/14  Yes Karie Schwalbeichard I Letvak, MD  nitroGLYCERIN (NITROSTAT) 0.4 MG SL tablet Place 0.4 mg under the tongue every 5 (five) minutes as needed.   Yes Historical Provider, MD  triamcinolone cream (KENALOG) 0.1 % Apply 1 application topically 2 (two) times daily as needed. For itchy areas 10/26/13  Yes Karie Schwalbeichard I Letvak, MD  feeding supplement (BOOST HIGH PROTEIN) LIQD Take 1 Container by mouth daily.    Historical Provider, MD      VITAL SIGNS:  Blood pressure 96/60, pulse 99, temperature 97.4 F (36.3 C), temperature source Oral, resp. rate 15, height 5\' 8"  (1.727 m), weight 58.968 kg (130 lb), SpO2 96 %.  PHYSICAL EXAMINATION:  Physical Exam  GENERAL:  81 y.o.-year-old patient lying in the bed with no acute distress. Thin. EYES: Pupils equal, round, reactive to light and accommodation. No scleral icterus. Extraocular muscles intact. Pallor positive HEENT: Head atraumatic, normocephalic. Oropharynx and  nasopharynx clear. No oropharyngeal erythema. Dry oral mucosa  NECK:  Supple, no jugular venous distention. No thyroid enlargement, no tenderness.  LUNGS: Normal breath sounds bilaterally, no wheezing, rales, rhonchi. No use of accessory muscles of respiration.  CARDIOVASCULAR: S1, S2 normal. No murmurs, rubs, or gallops.  ABDOMEN: Soft, nontender, nondistended. Bowel sounds present. No organomegaly or mass.  Foley catheter with dark urine EXTREMITIES: No pedal edema, cyanosis, or clubbing. + 2 pedal & radial pulses b/l.   NEUROLOGIC: Cranial nerves II through XII are intact. No focal Motor or sensory deficits appreciated b/l PSYCHIATRIC: The patient is alert and oriented x 3. Good affect.  SKIN: No obvious rash, lesion, or ulcer.  Diffuse muscle wasting.  LABORATORY PANEL:   CBC  Recent Labs Lab 05/09/15 0824  WBC 11.5*  HGB 11.0*  HCT 33.9*  PLT 171   ------------------------------------------------------------------------------------------------------------------  Chemistries   Recent Labs Lab 05/09/15 0824  NA 139  K 6.9*  CL 106  CO2 22  GLUCOSE 109*  BUN 91*  CREATININE 4.05*  CALCIUM 9.9   ------------------------------------------------------------------------------------------------------------------  Cardiac Enzymes No results for input(s): TROPONINI in the last 168 hours. ------------------------------------------------------------------------------------------------------------------  RADIOLOGY:  No results found.   IMPRESSION AND PLAN:   Andres [name]  is a 81 y.o. male with a known history of diabetes, hypertension, CAD, dementia presents to the emergency room brought in by his son for decreased oral intake, dehydration.   * ARF over CKD 3 with hyperkalemia Due to severe dehydration. Patient has received 1 L bolus of normal saline. We will bolus another liter of normal saline. Put him on continuous IV hydration. Encouraged him to increase oral  fluid intake. Monitor input and output. Check renal ultrasound. Consult nephrology if no improvement. IV calcium, bicarbonate, insulin, dextrose for hyperkalemia. We will repeat electrolytes in 2 hours. Patient is critically ill and needs telemetry monitoring for his hyperkalemia. High risk for cardiac arrest and death. Hold lisinopril.  * Severe protein calorie malnutrition Due to decreased oral intake. Patient likely has worsening dementia. Add nutritional supplements. Regular diet.  * Hypertension Continue home medications but hold lisinopril.  * DM Hold oral hypoglycemics  * DVT prophylaxis with heparin.  All the records are reviewed and case discussed with ED provider. Management plans discussed with the patient, family and they are in agreement.  CODE STATUS: DNR  TOTAL CRITICAL CARE TIME TAKING CARE OF THIS PATIENT: 40 minutes.    Milagros Loll R M.D on 05/09/2015 at 11:39 AM  Between 7am to 6pm - Pager - [phone]  After 6pm go to [email or web address] - password [email or web address] [email or web address]  [facility]  Office  [phone]  CC: Primary care physician; Tillman Abide, MD

## 2015-05-09 NOTE — Progress Notes (Signed)
Initial Nutrition Assessment  DOCUMENTATION CODES:  Severe malnutrition in context of acute illness/injury  INTERVENTION:  Meals and Snacks: Cater to patient preferences, SLP following recommending likely Pureed foods currently.  Medical Food Supplement Therapy: will recommend Ensure Enlive TID with meals for added nutrition; Ensure Enlive (each supplement provides 350kcal and 20 grams of protein), Magic cup BID for added nutrition as well  NUTRITION DIAGNOSIS:  Inadequate oral intake related to inability to eat, mouth pain as evidenced by per patient/family report, meal completion < 25%.  GOAL:  Patient will meet greater than or equal to 90% of their needs  MONITOR:   (Energy Intake, Electrolyte and renal Profile, Anthropometrics, Digestive System)  REASON FOR ASSESSMENT:  Consult Assessment of nutrition requirement/status (Malnutrition)  ASSESSMENT:  Pt admitted with acute renal failure with reduced po intake and dehydration. Pt reports mouth ulcer d/t dentures PTA. Pt reports dentures were fixed by dentist a couple of weeks ago but mouth pain persists. PMHx:  Past Medical History  Diagnosis Date  . CAD (coronary artery disease)   . Diverticulitis   . GERD (gastroesophageal reflux disease)   . Hyperlipidemia   . Hypertension   . BPH (benign prostatic hypertrophy)   . Diabetes mellitus   . DJD (degenerative joint disease)     lumbar spine  . IBS (irritable bowel syndrome)   . Stroke   . Depression 2012  . Acute MI 09/2012  . Chronic kidney disease     ACUTE RENAL FAILURE  . Foley catheter in place 05/09/15    for 1 year per family    Diet Order:  Diet regular Room service appropriate?: Yes; Fluid consistency:: Thin  Current Nutrition: Pt ate 0% of lunch delivered today c/o mouth pain. Per Weston Brass, SLP pt ate applesauce well with her on visit, however barely ate one bite of mashed potatoes and refused bread.  Food/Nutrition-Related History: Pt reports not  eating hardly anything for the past couple of weeks secondary to mouth pain, even after dentist reportedly fixed dentures. Pt reports not drinking much liquids either, that also caused pain. No supplement drinks PTA. RD asked pt if cold food items felt better such as ice cream, to which pt replied 'I don't know, I haven't tried.'   Medications: Novolog, MVI, kayexelate, NS at 163mL/hr  Electrolyte/Renal Profile and Glucose Profile:   Recent Labs Lab 05/09/15 0824 05/09/15 1357  NA 139 142  K 6.9* 6.2*  CL 106 111  CO2 22 20*  BUN 91* 83*  CREATININE 4.05* 3.62*  CALCIUM 9.9 9.7  GLUCOSE 109* 151*   Protein Profile:   Recent Labs Lab 05/09/15 1357  ALBUMIN 3.6   Lab Results  Component Value Date   HGBA1C 6.4* 05/09/2015   Gastrointestinal Profile: Last BM: unknown   Nutrition-Focused Physical Exam Findings: Nutrition-Focused physical exam completed. Findings are moderate fat depletion, severe muscle depletion of calf, patella, thigh and acromion process regions and moderate-severe muscle wasting of clavicle, temple and scapula regions, and no edema.   Weight Change: Pt reports usual body weight 130lbs, unsure of how long ago. Per [facility] pt 130lbs in February (10% weight loss in 5 months possible) Anthropometrics:  Height:  Ht Readings from Last 1 Encounters:  05/09/15  (1.727 m)    Weight:  Wt Readings from Last 1 Encounters:  05/09/15 117 lb (53.071 kg)    Ideal Body Weight:  70 kg  Wt Readings from Last 10 Encounters:  05/09/15 117 lb (53.071 kg)  12/17/14 130  lb (58.968 kg)  12/11/14 130 lb (58.968 kg)  07/27/14 130 lb (58.968 kg)  04/30/14 127 lb (57.607 kg)  12/06/13 121 lb (54.885 kg)  10/26/13 125 lb 8 oz (56.926 kg)  09/29/13 123 lb 12 oz (56.133 kg)  07/14/13 127 lb (57.607 kg)  05/10/13 126 lb (57.153 kg)    BMI:  Body mass index is 17.79 kg/(m^2).  Estimated Nutritional Needs:  Kcal:  1775-2097kcals, BEE: 1345kcals, TEE: (IF  1.1-1.3)(AF 1.2) using IBW of 70kg  Protein:  70-84g protein (1.0-1.2g/kg) using IBW of 70kg  Fluid:  1750-215600mL of fluid (25-9030mL/kg) using IBW of 70kg  Skin:  Reviewed, no issues  EDUCATION NEEDS:  Education needs no appropriate at this time   Intake/Output Summary (Last 24 hours) at 05/09/15 1550 Last data filed at 05/09/15 1200  Gross per 24 hour  Intake      0 ml  Output    300 ml  Net   -300 ml    HIGH Care Level  Leda QuailAllyson Khylin Gutridge, RD, LDN Pager [phone]

## 2015-05-09 NOTE — ED Notes (Signed)
Family back at pt bedside

## 2015-05-09 NOTE — Evaluation (Signed)
Clinical/Bedside Swallow Evaluation Patient Details  Name: Andres [name] MRN: [id number] Date of Birth: Apr 27, 1926  Today's Date: 05/09/2015 Time: SLP Start Time (ACUTE ONLY): 1440 SLP Stop Time (ACUTE ONLY): 1505 SLP Time Calculation (min) (ACUTE ONLY): 25 min  Past Medical History:  Past Medical History  Diagnosis Date  . CAD (coronary artery disease)   . Diverticulitis   . GERD (gastroesophageal reflux disease)   . Hyperlipidemia   . Hypertension   . BPH (benign prostatic hypertrophy)   . Diabetes mellitus   . DJD (degenerative joint disease)     lumbar spine  . IBS (irritable bowel syndrome)   . Stroke   . Depression 2012  . Acute MI 09/2012  . Chronic kidney disease     ACUTE RENAL FAILURE  . Foley catheter in place 05/09/15    for 1 year per family   Past Surgical History:  Past Surgical History  Procedure Laterality Date  . Angioplasty  1995  . Doppler echocardiography  11/1999    syncope  . Pacemaker insertion      Pacer/cath  (ef 65%)  1/01 -  guidant Discovery II DR pulse generator  . Prostate surgery  07/2003  . Esophagogastroduodenoscopy  07/2005    neg  . Cardiac catheterization  09/2012    NSTEMI/STENT PLACED  . Insert / replace / remove pacemaker      [id number]   HPI:  Per chart review, pt is a 81 y.o. male with a known history of diabetes, hypertension, CAD, dementia presents to the emergency room brought in by his son for decreased oral intake, dehydration. Patient initially developed an ulcer in his mouth from his dentures which caused decreased oral intake. Even after the ulcer has healed patient has continued to not eat well which prompted his son to bring the patient to the hospital..   Assessment / Plan / Recommendation Clinical Impression  Pt judged to be min risk of aspiration d/t mental status and overall deconditioned state. Aspiration risk is decreased with use of aspiration precautions.  No overt s/s of aspiration were observed w/puree  or thin. Solids not tested d/t pt refusal because of mouth pain. Recommend Dys I diet w/thin liquids in hopes diet change will lessen mouth pain. Pt may also benefit from removing dentures, however pt reports that his mouth hurts with or without dentures in place. Will f/u in 1-2 days.    Aspiration Risk  Mild    Diet Recommendation Dysphagia 1 (Puree);Thin   Medication Administration: Whole meds with liquid Compensations: Minimize environmental distractions;Slow rate;Small sips/bites    Other  Recommendations Oral Care Recommendations: Patient independent with oral care   Follow Up Recommendations       Frequency and Duration min 2x/week  1 week   Pertinent Vitals/Pain Pt reports mouth pain on a scale of 1-10 at a 5    SLP Swallow Goals     Swallow Study Prior Functional Status  Type of Home: House Available Help at Discharge: Family;Personal care attendant    General Date of Onset: 05/09/15 Other Pertinent Information: Per chart review, pt is a 81 y.o. male with a known history of diabetes, hypertension, CAD, dementia presents to the emergency room brought in by his son for decreased oral intake, dehydration. Patient initially developed an ulcer in his mouth from his dentures which caused decreased oral intake. Even after the ulcer has healed patient has continued to not eat well which prompted his son to bring the patient to the  hospital.. Type of Study: Bedside swallow evaluation Diet Prior to this Study: Regular;Thin liquids Temperature Spikes Noted: No Respiratory Status: Room air History of Recent Intubation: No Behavior/Cognition: Alert;Pleasant mood Oral Cavity - Dentition: Other (Comment) (upper and lower dentures) Self-Feeding Abilities: Needs set up;Able to feed self Patient Positioning: Upright in bed Baseline Vocal Quality: Normal Volitional Swallow: Able to elicit    Oral/Motor/Sensory Function Overall Oral Motor/Sensory Function: Appears within functional  limits for tasks assessed Labial ROM: Within Functional Limits Labial Symmetry: Within Functional Limits Labial Strength: Within Functional Limits Labial Sensation: Within Functional Limits Lingual ROM: Other (Comment) (mildly reduced d/t pt guarding mouth ) Lingual Symmetry: Within Functional Limits Lingual Strength: Within Functional Limits Lingual Sensation: Within Functional Limits Facial ROM: Within Functional Limits Facial Symmetry: Within Functional Limits Facial Strength: Within Functional Limits Facial Sensation: Within Functional Limits Velum: Within Functional Limits Mandible: Within Functional Limits   Ice Chips Ice chips: Within functional limits Presentation: Spoon Other Comments: 1 tsp   Thin Liquid Thin Liquid: Within functional limits Presentation: Cup;Self Fed;Straw Other Comments: 4 sips, no overt s/s of aspiration    Nectar Thick Nectar Thick Liquid: Not tested   Honey Thick Honey Thick Liquid: Not tested   Puree Puree: Within functional limits Presentation: Spoon Other Comments: 3 ounces of puree   Solid   GO    Solid: Not tested Other Comments: Pt refused any solids       Leighton,Maaran 05/09/2015,3:32 PM

## 2015-05-09 NOTE — ED Notes (Signed)
Pt reports that he is weak. Pt has a foley in place it appears to be purulent and has an odor. Pt is a poor historian.

## 2015-05-09 NOTE — Evaluation (Signed)
Physical Therapy Evaluation Patient Details Name: Andres [name] MRN: [id number] DOB: 01-Sep-1926 Today's Date: 05/09/2015   History of Present Illness  81 yo male with onset of UTI and anorexia with abd pain was admitted, now referred to PT .  Has CKD with hyperkalemia, had an incident with his foley where the cath was pulled and has ongoing bleeding.   PMHx:  CAD, diverticulitis, Lumbar DJD, stroke, MI, CKD  Clinical Impression  Pt has just been admitted and is quite weak, unable to get up to walk today.  Attempted to get up but sitting bedside is as far as PT got. He has more potential but is still passing blood in foley and will need some recovery time, in SNF.    Follow Up Recommendations SNF    Equipment Recommendations  3in1 (PT)    Recommendations for Other Services       Precautions / Restrictions Precautions Precautions: Fall Restrictions Weight Bearing Restrictions: No      Mobility  Bed Mobility Overal bed mobility: Needs Assistance Bed Mobility: Supine to Sit;Sit to Supine     Supine to sit: Mod assist Sit to supine: Mod assist   General bed mobility comments: Pt extremely weak and canot assist up to bedside with his level of energy  Transfers                 General transfer comment: declined to get up  Ambulation/Gait             General Gait Details: could not attempt  Stairs            Wheelchair Mobility    Modified Rankin (Stroke Patients Only)       Balance Overall balance assessment: Needs assistance   Sitting balance-Leahy Scale: Poor                                       Pertinent Vitals/Pain      Home Living Family/patient expects to be discharged to:: Private residence Living Arrangements: Alone Available Help at Discharge: Family;Personal care attendant Type of Home: House Home Access: Stairs to enter Entrance Stairs-Rails: Right;Left;Can reach both Entrance Stairs-Number of Steps:  3 Home Layout: Two level;Able to live on main level with bedroom/bathroom Home Equipment: Dan Humphreys - 2 wheels;Shower seat      Prior Function Level of Independence: Needs assistance   Gait / Transfers Assistance Needed: RW with independence  ADL's / Homemaking Assistance Needed: family and caregiver assistance        Hand Dominance        Extremity/Trunk Assessment   Upper Extremity Assessment: Generalized weakness           Lower Extremity Assessment: Generalized weakness      Cervical / Trunk Assessment: Kyphotic  Communication   Communication: HOH;Other (comment) (has hearing aids but not effective)  Cognition Arousal/Alertness: Lethargic Behavior During Therapy: Flat affect Overall Cognitive Status: Within Functional Limits for tasks assessed                      General Comments General comments (skin integrity, edema, etc.): Pt has a foley with very foul smell, has some blood in the line but looks old.  Has very lethargic appearance but has just arrived to [place].  Plan is to make him a SNF recommendation to allow for better mobility to avoid the fall he had recently  at home.    Exercises        Assessment/Plan    PT Assessment Patient needs continued PT services  PT Diagnosis Generalized weakness   PT Problem List Decreased strength;Decreased range of motion;Decreased activity tolerance;Decreased balance;Decreased mobility;Decreased coordination;Decreased knowledge of use of DME;Cardiopulmonary status limiting activity;Decreased skin integrity  PT Treatment Interventions DME instruction;Gait training;Stair training;Functional mobility training;Therapeutic activities;Therapeutic exercise;Balance training;Neuromuscular re-education;Patient/family education   PT Goals (Current goals can be found in the Care Plan section) Acute Rehab PT Goals Patient Stated Goal: to go home  PT Goal Formulation: With patient Time For Goal Achievement:  05/23/15 Potential to Achieve Goals: Good    Frequency Min 2X/week   Barriers to discharge Inaccessible home environment;Decreased caregiver support home alone at times    Co-evaluation               End of Session Equipment Utilized During Treatment: Oxygen Activity Tolerance: Patient limited by fatigue;Patient limited by lethargy Patient left: in bed;with call bell/phone within reach;with bed alarm set Nurse Communication: Mobility status         Time: 1335-1400 PT Time Calculation (min) (ACUTE ONLY): 25 min   Charges:   PT Evaluation $Initial PT Evaluation Tier I: 1 Procedure PT Treatments $Therapeutic Activity: 8-22 mins   PT G Codes:        Ivar DrapeStout, Wilma Wuthrich E 05/09/2015, 2:35 PM   Samul Dadauth Kellen Hover, PT MS Acute Rehab Dept. Number: [facility] [id number] and [facility] [phone]

## 2015-05-09 NOTE — ED Notes (Signed)
CBG 94 

## 2015-05-09 NOTE — Care Management (Signed)
Admitted to [facility] with falls/urinary tract infection. Lives with son, Jomarie LongsJoseph, [phone]).   Sees Dr. Tillman Abideichard Letvak.  Several conversations with Dr. Alphonsus SiasLetvak prior to presenting to the emergency room. Followed by [facility]. Chronic foley cath. Poor po intake x 2 weeks. Much weaker the last 2 weeks. Skilled Nursing facility x 2 in the past.  Physical therapy evaluation completed. Recommends skilled nursing facility. Gwenette GreetBrenda S Holland RN MSN Care Management [phone]

## 2015-05-09 NOTE — ED Notes (Addendum)
Andres [name], son [phone] would like to be contacted when patient gets to a hospital room.  Pt belongings taken home by son, Andres [name].

## 2015-05-09 NOTE — ED Provider Notes (Signed)
[facility] Emergency Department Provider Note   ____________________________________________  Time seen: 9 AM I have reviewed the triage vital signs and the triage nursing note.  HISTORY  Chief Complaint Weakness   Historian Patient, limited due to poor historian Son, with whom he lives  HPI Andres [name] is a 81 y.o. male who is brought in by his son for decreased by mouth intake, increased weakness, and concern for dehydration. The patient does have an indwelling Foley. He has had a history of acute renal failure in the past. There is report that the urine was foul-smelling. The son is concerned that the patient stop and eating due to ulcers in his mouth from dentures. Patient does not give a clear indication as to why he hasn't been eating. Symptoms are moderate. PCP is Dr. Alphonsus Sias    Past Medical History  Diagnosis Date  . CAD (coronary artery disease)   . Diverticulitis   . GERD (gastroesophageal reflux disease)   . Hyperlipidemia   . Hypertension   . BPH (benign prostatic hypertrophy)   . Diabetes mellitus   . DJD (degenerative joint disease)     lumbar spine  . IBS (irritable bowel syndrome)   . Stroke   . Depression 2012  . Acute MI 09/2012  . Chronic kidney disease     ACUTE RENAL FAILURE  . Foley catheter in place 05/09/15    for 1 year per family    Patient Active Problem List   Diagnosis Date Noted  . Preventative health care 12/17/2014  . Advance directive discussed with patient 12/17/2014  . Chronic retention of urine 04/30/2014  . Chronic kidney disease, stage III (moderate) 10/26/2013  . Pacemaker-[employer]tr[employer] 06/20/2012  . Episodic mood disorder   . CVA 12/01/2010  . SEBORRHEIC DERMATITIS 04/24/2010  . DEGENERATIVE JOINT DISEASE, LUMBAR SPINE 04/18/2009  . AV BLOCK, COMPLETE 04/03/2009  . Late effects of CVA (cerebrovascular accident) 01/14/2009  . ACTINIC KERATOSIS 06/01/2008  . IRRITABLE BOWEL SYNDROME 05/19/2007  .  Type 2 diabetes mellitus with renal manifestations, controlled 04/04/2007  . HYPERLIPIDEMIA 04/01/2007  . HYPERTENSION 04/01/2007  . Coronary atherosclerosis of native coronary artery 04/01/2007  . GERD 04/01/2007  . DIVERTICULOSIS, COLON 04/01/2007  . BPH with urinary obstruction 04/01/2007    Past Surgical History  Procedure Laterality Date  . Angioplasty  1995  . Doppler echocardiography  11/1999    syncope  . Pacemaker insertion      Pacer/cath  (ef 65%)  1/01 -  guidant Discovery II DR pulse generator  . Prostate surgery  07/2003  . Esophagogastroduodenoscopy  07/2005    neg  . Cardiac catheterization  09/2012    NSTEMI/STENT PLACED  . Insert / replace / remove pacemaker      [id number]    Current Outpatient Rx  Name  Route  Sig  Dispense  Refill  . aspirin 81 MG tablet   Oral   Take 81 mg by mouth daily.           Marland Kitchen atorvastatin (LIPITOR) 10 MG tablet      Take 1 tablet by mouth  daily   90 tablet   3   . citalopram (CELEXA) 10 MG tablet      Take 1 tablet by mouth  daily   90 tablet   1   . clopidogrel (PLAVIX) 75 MG tablet      Take 1 tablet by mouth  daily   90 tablet   3   .  docusate sodium (COLACE) 100 MG capsule   Oral   Take 100 mg by mouth 2 (two) times daily.         . finasteride (PROSCAR) 5 MG tablet      Take 1 tablet by mouth  daily   90 tablet   3   . fluticasone (FLONASE) 50 MCG/ACT nasal spray   Nasal   Place 2 sprays into the nose daily.   16 g   1   . GLIPIZIDE XL 5 MG 24 hr tablet      Take 1 tablet (5 mg total)  by mouth daily.   90 tablet   3   . isosorbide mononitrate (IMDUR) 30 MG 24 hr tablet      Take 1 tablet by mouth  daily   90 tablet   3   . lisinopril (PRINIVIL,ZESTRIL) 5 MG tablet      Take 1 tablet by mouth  daily   90 tablet   3   . metFORMIN (GLUCOPHAGE) 1000 MG tablet      Take one-half tablet by  mouth two times daily with  meals   90 tablet   3   . metoprolol tartrate (LOPRESSOR)  25 MG tablet      Take 1 tablet by mouth  twice a day   180 tablet   1   . nitroGLYCERIN (NITROSTAT) 0.4 MG SL tablet   Sublingual   Place 0.4 mg under the tongue every 5 (five) minutes as needed.         . triamcinolone cream (KENALOG) 0.1 %   Topical   Apply 1 application topically 2 (two) times daily as needed. For itchy areas   30 g   2   . feeding supplement (BOOST HIGH PROTEIN) LIQD   Oral   Take 1 Container by mouth daily.           Allergies Review of patient's allergies indicates no known allergies.  Family History  Problem Relation Age of Onset  . Diabetes Sister     Social History History  Substance Use Topics  . Smoking status: Former Games developer  . Smokeless tobacco: Never Used  . Alcohol Use: No     Comment: heavy in the past    Review of Systems  Constitutional: Negative for fever. Eyes: Negative for visual changes. ENT: Negative for sore throat. Cardiovascular: Negative for chest pain. Respiratory: Negative for shortness of breath. Gastrointestinal: No vomiting and diarrhea. Chronic abdominal pain Genitourinary: Negative for dysuria. Indwelling Foley catheter Musculoskeletal: Negative for back pain. Skin: Negative for rash. Neurological: Negative for headaches, focal weakness or numbness. At mental status baseline, but possible mild confusion. 10 point Review of Systems otherwise negative ____________________________________________   PHYSICAL EXAM:  VITAL SIGNS: ED Triage Vitals  Enc Vitals Group     BP 05/09/15 0753 112/64 mmHg     Pulse Rate 05/09/15 0753 80     Resp 05/09/15 0753 20     Temp 05/09/15 0753 97.4 F (36.3 C)     Temp Source 05/09/15 0753 Oral     SpO2 05/09/15 0753 99 %     Weight 05/09/15 0753 130 lb (58.968 kg)     Height 05/09/15 0753  (1.727 m)     Head Cir --      Peak Flow --      Pain Score 05/09/15 0754 4     Pain Loc --      Pain Edu? --  Excl. in GC? --      Constitutional: Alert and  cooperative. Heart of hearing. Poor historian.. Cachectic. No acute distress. Eyes: Conjunctivae are normal. PERRL. Normal extraocular movements. ENT   Head: Normocephalic and atraumatic.   Nose: No congestion/rhinnorhea.   Mouth/Throat: Mucous membranes are moderately dry dentures in place.   Neck: No stridor. Cardiovascular/Chest: Normal rate, regular rhythm.  No murmurs, rubs, or gallops. Respiratory: Normal respiratory effort without tachypnea nor retractions. Breath sounds are clear and equal bilaterally. No wheezes/rales/rhonchi. Gastrointestinal: Soft. No distention, no guarding, no rebound. Mild tenderness diffusely.  Genitourinary/rectal: Foley catheter in place Musculoskeletal: Nontender with normal range of motion in all extremities. No joint effusions.  No lower extremity tenderness nor edema. Poor muscle mass Neurologic:  Normal speech and language. No gross focal neurologic deficits are appreciated. Skin:  Skin is warm, dry and intact. No rash noted.   ____________________________________________   EKG I, Governor Rooksebecca Camdynn Maranto, MD, the attending physician have personally viewed and interpreted all ECGs.  75 bpm. Electronic ventricular pacemaker. ____________________________________________  LABS (pertinent positives/negatives)  White blood cell count 1.5, hemoglobin 11.0 Potassium 6.9 BUN 91 and creatinine 4.05 Urinalysis pending  ____________________________________________  RADIOLOGY All Xrays were viewed by me. Imaging interpreted by Radiologist.  None __________________________________________  PROCEDURES  Procedure(s) performed: None Critical Care performed: CRITICAL CARE Performed by: Governor RooksLORD, Mariaclara Spear   Total critical care time: 30 minutes  Critical care time was exclusive of separately billable procedures and treating other patients.  Critical care was necessary to treat or prevent imminent or life-threatening deterioration.  Critical care was  time spent personally by me on the following activities: development of treatment plan with patient and/or surrogate as well as nursing, discussions with consultants, evaluation of patient's response to treatment, examination of patient, obtaining history from patient or surrogate, ordering and performing treatments and interventions, ordering and review of laboratory studies, ordering and review of radiographic studies, pulse oximetry and re-evaluation of patient's condition.   ____________________________________________   ED COURSE / ASSESSMENT AND PLAN  CONSULTATIONS: Discussed with hospitalist face-to-face for admission  Pertinent labs & imaging results that were available during my care of the patient were reviewed by me and considered in my medical decision making (see chart for details).  Symptoms suggestive of failure to thrive and inability to eat and drink very well since being at home. He is very cachectic. No specific altered mental status although may be slightly more confused. No symptoms of acute stroke. History of symptoms are consistent with laboratory evaluation showing he is in acute renal failure. He is significantly hyperkalemic with Potassium of 6.9, prompted treatment with calcium, bicarbonate, insulin and glucose, albuterol, and Kayexalate. He was given IV fluids for initial treatment of dehydration. I did spend time discussing with the patient and his son, and the nurse in terms of treatment for hyperkalemia. Care provided emergently in order to prevent potential for cardiac decompensation due to hyperkalemia.   Patient / Family / Caregiver informed of clinical course, medical decision-making process, and agree with plan.   I discussed return precautions, follow-up instructions, and discharged instructions with patient and/or family.  ___________________________________________   FINAL CLINICAL IMPRESSION(S) / ED DIAGNOSES   Final diagnoses:  Acute renal  failure, unspecified acute renal failure type  Hyperkalemia     Governor Rooksebecca Misty Rago, MD 05/09/15 1118

## 2015-05-10 DIAGNOSIS — T83021A Displacement of indwelling urethral catheter, initial encounter: Secondary | ICD-10-CM | POA: Diagnosis present

## 2015-05-10 DIAGNOSIS — E875 Hyperkalemia: Secondary | ICD-10-CM | POA: Diagnosis present

## 2015-05-10 LAB — BASIC METABOLIC PANEL
Anion gap: 9 (ref 5–15)
BUN: 61 mg/dL — AB (ref 6–20)
CALCIUM: 8.9 mg/dL (ref 8.9–10.3)
CO2: 28 mmol/L (ref 22–32)
Chloride: 110 mmol/L (ref 101–111)
Creatinine, Ser: 2.73 mg/dL — ABNORMAL HIGH (ref 0.61–1.24)
GFR calc Af Amer: 23 mL/min — ABNORMAL LOW (ref >60)
GFR calc non Af Amer: 19 mL/min — ABNORMAL LOW (ref >60)
GLUCOSE: 137 mg/dL — AB (ref 65–99)
Potassium: 4.6 mmol/L (ref 3.5–5.1)
Sodium: 147 mmol/L — ABNORMAL HIGH (ref 135–145)

## 2015-05-10 LAB — GLUCOSE, CAPILLARY
GLUCOSE-CAPILLARY: 171 mg/dL — AB (ref 65–99)
Glucose-Capillary: 94 mg/dL (ref 65–99)
Glucose-Capillary: 186 mg/dL — ABNORMAL HIGH (ref 65–99)
Glucose-Capillary: 261 mg/dL — ABNORMAL HIGH (ref 65–99)
Glucose-Capillary: 375 mg/dL — ABNORMAL HIGH (ref 65–99)

## 2015-05-10 LAB — CBC
HCT: 26.4 % — ABNORMAL LOW (ref 40.0–52.0)
Hemoglobin: 8.8 g/dL — ABNORMAL LOW (ref 13.0–18.0)
MCH: 32 pg (ref 26.0–34.0)
MCHC: 33.5 g/dL (ref 32.0–36.0)
MCV: 95.6 fL (ref 80.0–100.0)
PLATELETS: 129 10*3/uL — AB (ref 150–440)
RBC: 2.77 MIL/uL — ABNORMAL LOW (ref 4.40–5.90)
RDW: 14.4 % (ref 11.5–14.5)
WBC: 7.5 10*3/uL (ref 3.8–10.6)

## 2015-05-10 MED ORDER — SODIUM CHLORIDE 0.45 % IV SOLN
INTRAVENOUS | Status: DC
Start: 2015-05-10 — End: 2015-05-11
  Administered 2015-05-10 (×2): via INTRAVENOUS

## 2015-05-10 MED ORDER — CIPROFLOXACIN HCL 250 MG PO TABS: 250.0000 mg | ORAL_TABLET | Freq: Two times a day (BID) | ORAL | Status: DC

## 2015-05-10 MED ORDER — ADULT MULTIVITAMIN W/MINERALS CH: 1.0000 | ORAL_TABLET | Freq: Every day | ORAL | Status: AC

## 2015-05-10 MED ORDER — CEFTRIAXONE SODIUM IN DEXTROSE 20 MG/ML IV SOLN
1.0000 g | INTRAVENOUS | Status: DC
  Administered 2015-05-10 – 2015-05-14 (×5): 1 g via INTRAVENOUS
  Filled 2015-05-10 (×6): qty 50

## 2015-05-10 MED ORDER — ALBUTEROL SULFATE (2.5 MG/3ML) 0.083% IN NEBU: 2.5000 mg | INHALATION_SOLUTION | RESPIRATORY_TRACT | Status: DC | PRN

## 2015-05-10 NOTE — Care Management (Signed)
Important Message  Patient Details  Name: Andres [name] MRN: [id number] Date of Birth: [birth year]   Medicare Important Message Given:  Yes-second notification given    Olegario MessierKathy A Allmond 05/10/2015, 10:06 AM

## 2015-05-10 NOTE — Discharge Summary (Signed)
[facility] [place] [facility]   PATIENT NAME: Andres [name]    MR#:  [id number]  DATE OF BIRTH:  [birth year]  DATE OF ADMISSION:  05/09/2015 ADMITTING PHYSICIAN: Milagros LollSrikar Mahati Vajda, MD  DATE OF DISCHARGE: No discharge date for patient encounter.  PRIMARY CARE PHYSICIAN: Tillman Abideichard Letvak, MD    ADMISSION DIAGNOSIS:  Hyperkalemia [E87.5] Acute renal failure, unspecified acute renal failure type [N17.9]  DISCHARGE DIAGNOSIS:  Active Problems:   ARF (acute renal failure)   Protein-calorie malnutrition, severe   UTI (urinary tract infection) due to urinary indwelling catheter   Hyperkalemia   SECONDARY DIAGNOSIS:   Past Medical History  Diagnosis Date  . CAD (coronary artery disease)   . Diverticulitis   . GERD (gastroesophageal reflux disease)   . Hyperlipidemia   . Hypertension   . BPH (benign prostatic hypertrophy)   . Diabetes mellitus   . DJD (degenerative joint disease)     lumbar spine  . IBS (irritable bowel syndrome)   . Stroke   . Depression 2012  . Acute MI 09/2012  . Chronic kidney disease     ACUTE RENAL FAILURE  . Foley catheter in place 05/09/15    for 1 year per family     ADMITTING HISTORY  Andres [name] is a 81 y.o. male with a known history of diabetes, hypertension, CAD, dementia presents to the emergency room brought in by his son for decreased oral intake, dehydration. Patient initially developed an ulcer in his mouth from his dentures which caused decreased oral intake. Even after the ulcer has healed patient has continued to not eat well which prompted his son to bring the patient to the hospital. Here patient's main complaint is some mild abdominal pain which is chronic per his son. No complaints of trouble swallowing. No shortness of breath or vomiting or diarrhea. No fever. He has been found to have acute renal failure with creatinine of 4.05 with potassium of 6.9 and is being admitted to the hospitalist service. He has  a chronic indwelling Foley catheter due to benign prostatic hypertrophy.   HOSPITAL COURSE:   Andres [name] is a 81 y.o. male with a known history of diabetes, hypertension, CAD, dementia presents to the emergency room brought in by his son for decreased oral intake, dehydration.   * ARF over CKD 3 with hyperkalemia Due to severe dehydration. Improving. K is normal now. Good UOP.  * Severe protein calorie malnutrition Due to decreased oral intake. Patient likely has worsening dementia. Add nutritional supplements. Regular diet.  * Hypertension Continue home medications.  * DM Hold oral hypoglycemics SSI  * DVT prophylaxis with heparin.  * UTI - catheter related ON IV abx in hospital. Ciprofloxacin at discharge.  D/C to SNF in stable condition    CONSULTS OBTAINED:    PT   DRUG ALLERGIES:  No Known Allergies  DISCHARGE MEDICATIONS:   Current Discharge Medication List    START taking these medications   Details  albuterol (PROVENTIL) (2.5 MG/3ML) 0.083% nebulizer solution Take 3 mLs (2.5 mg total) by nebulization every 2 (two) hours as needed for wheezing. Qty: 75 mL, Refills: 12    ciprofloxacin (CIPRO) 250 MG tablet Take 1 tablet (250 mg total) by mouth 2 (two) times daily. Qty: 10 tablet, Refills: 0    Multiple Vitamin (MULTIVITAMIN WITH MINERALS) TABS tablet Take 1 tablet by mouth daily.      CONTINUE these medications which have NOT CHANGED   Details  aspirin 81  MG tablet Take 81 mg by mouth daily.      atorvastatin (LIPITOR) 10 MG tablet Take 1 tablet by mouth  daily Qty: 90 tablet, Refills: 3    citalopram (CELEXA) 10 MG tablet Take 1 tablet by mouth  daily Qty: 90 tablet, Refills: 1    clopidogrel (PLAVIX) 75 MG tablet Take 1 tablet by mouth  daily Qty: 90 tablet, Refills: 3    docusate sodium (COLACE) 100 MG capsule Take 100 mg by mouth 2 (two) times daily.    finasteride (PROSCAR) 5 MG tablet Take 1 tablet by mouth  daily Qty: 90 tablet,  Refills: 3    fluticasone (FLONASE) 50 MCG/ACT nasal spray Place 2 sprays into the nose daily. Qty: 16 g, Refills: 1    GLIPIZIDE XL 5 MG 24 hr tablet Take 1 tablet (5 mg total)  by mouth daily. Qty: 90 tablet, Refills: 3    isosorbide mononitrate (IMDUR) 30 MG 24 hr tablet Take 1 tablet by mouth  daily Qty: 90 tablet, Refills: 3    lisinopril (PRINIVIL,ZESTRIL) 5 MG tablet Take 1 tablet by mouth  daily Qty: 90 tablet, Refills: 3    metFORMIN (GLUCOPHAGE) 1000 MG tablet Take one-half tablet by  mouth two times daily with  meals Qty: 90 tablet, Refills: 3    metoprolol tartrate (LOPRESSOR) 25 MG tablet Take 1 tablet by mouth  twice a day Qty: 180 tablet, Refills: 1    nitroGLYCERIN (NITROSTAT) 0.4 MG SL tablet Place 0.4 mg under the tongue every 5 (five) minutes as needed.    triamcinolone cream (KENALOG) 0.1 % Apply 1 application topically 2 (two) times daily as needed. For itchy areas Qty: 30 g, Refills: 2    feeding supplement (BOOST HIGH PROTEIN) LIQD Take 1 Container by mouth daily.         Today    VITAL SIGNS:  Blood pressure 109/46, pulse 67, temperature 97.5 F (36.4 C), temperature source Oral, resp. rate 18, height  (1.727 m), weight 51.71 kg (114 lb), SpO2 100 %.  I/O:   Intake/Output Summary (Last 24 hours) at 05/10/15 1137 Last data filed at 05/10/15 0900  Gross per 24 hour  Intake   1225 ml  Output    651 ml  Net    574 ml    PHYSICAL EXAMINATION:  Physical Exam  GENERAL:  81 y.o.-year-old patient lying in the bed with no acute distress. Decreased hearing. LUNGS: Normal breath sounds bilaterally, no wheezing, rales,rhonchi or crepitation. No use of accessory muscles of respiration.  CARDIOVASCULAR: S1, S2 normal. No murmurs, rubs, or gallops.  ABDOMEN: Soft, non-tender, non-distended. Bowel sounds present. No organomegaly or mass.  NEUROLOGIC: Moves all 4 extremities. PSYCHIATRIC: The patient is alert and awake SKIN: No obvious rash, lesion,  or ulcer.   DATA REVIEW:   CBC  Recent Labs Lab 05/10/15 0433  WBC 7.5  HGB 8.8*  HCT 26.4*  PLT 129*    Chemistries   Recent Labs Lab 05/09/15 1357  05/10/15 0433  NA 142  --  147*  K 6.2*  < > 4.6  CL 111  --  110  CO2 20*  --  28  GLUCOSE 151*  --  137*  BUN 83*  --  61*  CREATININE 3.62*  --  2.73*  CALCIUM 9.7  --  8.9  AST 20  --   --   ALT 14*  --   --   ALKPHOS 87  --   --  BILITOT 0.6  --   --   < > = values in this interval not displayed.  Cardiac Enzymes No results for input(s): TROPONINI in the last 168 hours.  Microbiology Results  Results for orders placed or performed in visit on 03/02/13  Urine culture     Status: None   Collection Time: 03/02/13 10:25 AM  Result Value Ref Range Status   Micro Text Report   Final       SOURCE: INDWELLING CATHETER    COMMENT                   NO GROWTH IN 18-24 HOURS   ANTIBIOTIC                                                      Culture, blood (single)     Status: None   Collection Time: 03/02/13 11:01 AM  Result Value Ref Range Status   Micro Text Report   Final       COMMENT                   NO GROWTH AEROBICALLY/ANAEROBICALLY IN 5 DAYS   ANTIBIOTIC                                                      Culture, blood (single)     Status: None   Collection Time: 03/02/13 11:12 AM  Result Value Ref Range Status   Micro Text Report   Final       COMMENT                   NO GROWTH AEROBICALLY/ANAEROBICALLY IN 5 DAYS   ANTIBIOTIC                                                        RADIOLOGY:  US Renal  05/09/2015   CLINICAL DATA:  Acute renal failure  EXAM: RENAL / URINARY TRACT ULTRASOUND COMPLETE  COMPARISON:  03/01/2013  FINDINGS: Right Kidney:  Length: 11.1 cm. Increased parenchymal echogenicity. Inferior pole cyst measures 1.6 x 1.8 x 1.8 cm. No mass or hydronephrosis.  Left Kidney:  Length: 12.4 cm. Increased parenchymal echogenicity. Cyst within the midpole measures 1.3 x 1.4 x 1.6  cm. No mass or hydronephrosis visualized.  Bladder:  Appears normal for degree of bladder distention.  IMPRESSION: 1. Bilateral echogenic kidneys compatible with chronic medical renal disease.   Electronically Signed   By: Signa Kell M.D.   On: 05/09/2015 17:12      Follow up with PCP in 1 week.  Management plans discussed with the patient, family and they are in agreement.  CODE STATUS:     Code Status Orders        Start     Ordered   05/09/15 1149  Do not attempt resuscitation (DNR)   Continuous    Question Answer Comment  In the event of cardiac or respiratory ARREST Do not call a "code  blue"   In the event of cardiac or respiratory ARREST Do not perform Intubation, CPR, defibrillation or ACLS   In the event of cardiac or respiratory ARREST Use medication by any route, position, wound care, and other measures to relive pain and suffering. May use oxygen, suction and manual treatment of airway obstruction as needed for comfort.      05/09/15 1148    Advance Directive Documentation        Most Recent Value   Type of Advance Directive  Healthcare Power of Attorney   Pre-existing out of facility DNR order (yellow form or pink MOST form)     "MOST" Form in Place?        TOTAL TIME TAKING CARE OF THIS PATIENT ON DAY OF DISCHARGE: more than 30 minutes.    Milagros Loll R M.D on 05/10/2015 at 11:37 AM  Between 7am to 6pm - Pager - [phone]  After 6pm go to [email or web address] - password [email or web address]  [facility]  Office  [phone]  CC: Primary care physician; Tillman Abide, MD

## 2015-05-10 NOTE — Progress Notes (Addendum)
[facility] - [place] at [facility]   PATIENT NAME: Andres [name]    MR#:  [id number]  DATE OF BIRTH:  07/11/1926  SUBJECTIVE:  CHIEF COMPLAINT:   Chief Complaint  Patient presents with  . Weakness  Feels well. Eating well. No pain/SOB.  REVIEW OF SYSTEMS:    Review of Systems  Constitutional: Negative for fever and chills.  HENT: Negative for sore throat.   Eyes: Negative for blurred vision, double vision and pain.  Respiratory: Negative for cough, hemoptysis, shortness of breath and wheezing.   Cardiovascular: Negative for chest pain, palpitations, orthopnea and leg swelling.  Gastrointestinal: Negative for heartburn, nausea, vomiting, abdominal pain, diarrhea and constipation.  Genitourinary: Negative for dysuria and hematuria.  Musculoskeletal: Negative for back pain and joint pain.  Skin: Negative for rash.  Neurological: Negative for sensory change, speech change, focal weakness and headaches.  Endo/Heme/Allergies: Does not bruise/bleed easily.  Psychiatric/Behavioral: Negative for depression. The patient is not nervous/anxious.       DRUG ALLERGIES:  No Known Allergies  VITALS:  Blood pressure 109/46, pulse 67, temperature 97.5 F (36.4 C), temperature source Oral, resp. rate 18, height  (1.727 m), weight 51.71 kg (114 lb), SpO2 100 %.  PHYSICAL EXAMINATION:   Physical Exam  GENERAL:  81 y.o.-year-old patient lying in the bed with no acute distress.  EYES: Pupils equal, round, reactive to light and accommodation. No scleral icterus. Extraocular muscles intact.  HEENT: Head atraumatic, normocephalic. Oropharynx and nasopharynx clear. Hearing loss NECK:  Supple, no jugular venous distention. No thyroid enlargement, no tenderness.  LUNGS: Normal breath sounds bilaterally, no wheezing, rales, rhonchi. No use of accessory muscles of respiration.  CARDIOVASCULAR: S1, S2 normal. No murmurs, rubs, or gallops.  ABDOMEN: Soft, nontender,  nondistended. Bowel sounds present. No organomegaly or mass. Foley in place EXTREMITIES: No cyanosis, clubbing or edema b/l.    NEUROLOGIC: Cranial nerves II through XII are intact. No focal Motor or sensory deficits b/l.   PSYCHIATRIC: The patient is alert and awake. SKIN: No obvious rash, lesion, or ulcer.    LABORATORY PANEL:   CBC  Recent Labs Lab 05/10/15 0433  WBC 7.5  HGB 8.8*  HCT 26.4*  PLT 129*   ------------------------------------------------------------------------------------------------------------------  Chemistries   Recent Labs Lab 05/09/15 1357  05/10/15 0433  NA 142  --  147*  K 6.2*  < > 4.6  CL 111  --  110  CO2 20*  --  28  GLUCOSE 151*  --  137*  BUN 83*  --  61*  CREATININE 3.62*  --  2.73*  CALCIUM 9.7  --  8.9  AST 20  --   --   ALT 14*  --   --   ALKPHOS 87  --   --   BILITOT 0.6  --   --   < > = values in this interval not displayed. ------------------------------------------------------------------------------------------------------------------  Cardiac Enzymes No results for input(s): TROPONINI in the last 168 hours. ------------------------------------------------------------------------------------------------------------------  RADIOLOGY:  US Renal  05/09/2015   CLINICAL DATA:  Acute renal failure  EXAM: RENAL / URINARY TRACT ULTRASOUND COMPLETE  COMPARISON:  03/01/2013  FINDINGS: Right Kidney:  Length: 11.1 cm. Increased parenchymal echogenicity. Inferior pole cyst measures 1.6 x 1.8 x 1.8 cm. No mass or hydronephrosis.  Left Kidney:  Length: 12.4 cm. Increased parenchymal echogenicity. Cyst within the midpole measures 1.3 x 1.4 x 1.6 cm. No mass or hydronephrosis visualized.  Bladder:  Appears normal for degree of  bladder distention.  IMPRESSION: 1. Bilateral echogenic kidneys compatible with chronic medical renal disease.   Electronically Signed   By: Signa Kellaylor  Stroud M.D.   On: 05/09/2015 17:12     ASSESSMENT AND PLAN:    Andres [name] is a 81 y.o. male with a known history of diabetes, hypertension, CAD, dementia presents to the emergency room brought in by his son for decreased oral intake, dehydration.   * ARF over CKD 3 with hyperkalemia Due to severe dehydration. Improving. K is normal now. Good UOP. Repeat BMP tomorrow  * Severe protein calorie malnutrition Due to decreased oral intake. Patient likely has worsening dementia. Add nutritional supplements. Regular diet.  *Catheter related UTI Start IV ceftriaxone  * Hypertension Continue home medications but hold lisinopril.  * DM Hold oral hypoglycemics SSI  * DVT prophylaxis with heparin.  All the records are reviewed and case discussed with Care Management/Social Workerr. Management plans discussed with the patient, family and they are in agreement.  CODE STATUS: DNR  DVT Prophylaxis: SCDs  TOTAL TIME TAKING CARE OF THIS PATIENT: 35 minutes.   POSSIBLE D/C IN 1-2 DAYS, DEPENDING ON CLINICAL CONDITION.   Milagros LollSudini, Jeanne Terrance R M.D on 05/10/2015 at 11:27 AM  Between 7am to 6pm - Pager - [phone]  After 6pm go to [email or web address] - password [email or web address] [facility]  [facility]  Office  [phone]  CC: Primary care physician; Tillman Abideichard Letvak, MD

## 2015-05-10 NOTE — Plan of Care (Signed)
Problem: Discharge Progression Outcomes Goal: Discharge plan in place and appropriate Individualization: Pt prefers to be called Andres [name] who lives at home with his son. Hx CAD, GERD, HTN, BPH, DM, IBS, controlled by home medications.  High fall risk. Hourly rounding, bed alarm on. Room near nurses station.  Goal: Other Discharge Outcomes/Goals 1. No c/o pain. Resting during the night.  2. Hemodynamically:              -VSS, afebrile, IVF infusing             -K 6.2. Insulin, D50, & Sodium Bicarb given. Will be rechecked this morning.             -Chronic Foley catheter in place w/ adequate output.  3. Pt able to roll in bed when performing care. Generalized weakness remains.

## 2015-05-10 NOTE — Progress Notes (Signed)
Physical Therapy Treatment Patient Details Name: Andres [name] MRN: [id number] DOB: [birth year] Today's Date: 05/10/2015    History of Present Illness 81 yo male with onset of UTI and anorexia with abd pain was admitted, now referred to PT .  Has CKD with hyperkalemia, had an incident with his foley where the cath was pulled and has ongoing bleeding.   PMHx:  CAD, diverticulitis, Lumbar DJD, stroke, MI, CKD    PT Comments    Pt was seen for evaluation of his ability to stand and get into chair with better effort to stand and control LE's.  His plan is for SNF as he was an independent ambulator before the hospital and needs to regain this level.  Pt is home alone normally awith family checking on him.  Follow Up Recommendations  SNF     Equipment Recommendations  3in1 (PT)    Recommendations for Other Services       Precautions / Restrictions Precautions Precautions: Fall Restrictions Weight Bearing Restrictions: Yes    Mobility  Bed Mobility Overal bed mobility: Needs Assistance Bed Mobility: Supine to Sit     Supine to sit: Min assist;Mod assist     General bed mobility comments: able to sit bedside now and can use UE's to help scoot but in pain from catheter  Transfers Overall transfer level: Needs assistance Equipment used: 1 person hand held assist Transfers: Sit to/from Stand;Stand Pivot Transfers Sit to Stand: Min assist Stand pivot transfers: Min assist       General transfer comment: assisted to chair wiht pt voluntarily standing several times with PT to increase comfort with catheter  Ambulation/Gait Ambulation/Gait assistance: Min assist Ambulation Distance (Feet): 4 Feet Assistive device: 1 person hand held assist Gait Pattern/deviations: Decreased dorsiflexion - right;Decreased dorsiflexion - left;Antalgic;Wide base of support Gait velocity: slow Gait velocity interpretation: Below normal speed for age/gender General Gait Details: sidesteps to  chair with slow step to pace   Stairs            Wheelchair Mobility    Modified Rankin (Stroke Patients Only)       Balance     Sitting balance-Leahy Scale: Fair                              Cognition Arousal/Alertness: Awake/alert Behavior During Therapy: Flat affect Overall Cognitive Status: Within Functional Limits for tasks assessed                      Exercises      General Comments General comments (skin integrity, edema, etc.): Pt more alert and feeling sick but willing to move.        Pertinent Vitals/Pain Pain Assessment: Faces Faces Pain Scale: Hurts whole lot Pain Location: catheter insertion Pain Intervention(s): Limited activity within patient's tolerance;Monitored during session;Other (comment) (Nurse in room to talk with pt about it)    Home Living                      Prior Function            PT Goals (current goals can now be found in the care plan section) Acute Rehab PT Goals Patient Stated Goal: go home when able Progress towards PT goals: Progressing toward goals    Frequency  Min 2X/week    PT Plan Current plan remains appropriate    Co-evaluation  End of Session   Activity Tolerance: Patient limited by lethargy;Patient tolerated treatment well Patient left: in chair;with call bell/phone within reach;with chair alarm set;with nursing/sitter in room     Time: 1015-1039 PT Time Calculation (min) (ACUTE ONLY): 24 min  Charges:  $Therapeutic Activity: 23-37 mins                    G Codes:      Ivar Drape 06-09-15, 12:42 PM   Samul Dada, PT MS Acute Rehab Dept. Number: [facility] [id number] and [facility] [phone]

## 2015-05-10 NOTE — Progress Notes (Signed)
Speech Language Pathology Treatment: Dysphagia  Patient Details Name: Andres [name] MRN: [id number] DOB: 1926/04/21 Today's Date: 05/10/2015 Time: [id number] SLP Time Calculation (min) (ACUTE ONLY): 20 min  Assessment / Plan / Recommendation Clinical Impression  Pt tolerated dysphagia I diet w/thin liquid boluses w/out overt s/s of aspiration. Pt reports his mouth does not hurt today. Initially pt refused mech soft trials, however after PT sat pt upright on edge of bed pt was willing to take one bite of mech soft. Pt masticated and cleared well, but then c/o nausea. Recommend continue w/current diet Dysaphagia I w/thin liquids. Will f/u re: toleration of diet and further mech soft/solid trials in 1-2 days.   HPI Other Pertinent Information: Per chart review, pt is a 81 y.o. male with a known history of diabetes, hypertension, CAD, dementia presents to the emergency room brought in by his son for decreased oral intake, dehydration. Pt was complaining of mouth pain since admission, but today reports his mouth feels better.    Pertinent Vitals Pain Assessment: No/denies pain  SLP Plan  Continue with current plan of care    Recommendations Diet recommendations: Dysphagia 1 (puree);Thin liquid Liquids provided via: Straw Medication Administration: Whole meds with liquid Supervision: Patient able to self feed Compensations: Minimize environmental distractions;Slow rate;Small sips/bites Postural Changes and/or Swallow Maneuvers: Seated upright 90 degrees;Upright 30-60 min after meal              Oral Care Recommendations: Patient independent with oral care Follow up Recommendations: Skilled Nursing facility Plan: Continue with current plan of care    GO     ,Maaran 05/10/2015, 10:46 AM

## 2015-05-10 NOTE — Plan of Care (Addendum)
Problem: Discharge Progression Outcomes Goal: Other Discharge Outcomes/Goals Outcome: Progressing Patient continues as fall risk. Hourly rounding, bed alarm on. Room near nurses station PT worked with patient today and her was up to chair in afternoon No c/o pain. Hemodynamically:VSS expect for low BP metoprolol held in am MD aware, afebrile, IVF infusing and continues on ABX  K+ down to 4.6 Chronic Foley catheter in place w/ adequate output.   Bed search for possible SNF placement

## 2015-05-10 NOTE — Progress Notes (Signed)
Nutrition Follow-Up  DOCUMENTATION CODES:  Severe malnutrition in context of acute illness/injury  INTERVENTION:  Meals and Snacks: Cater to patient preferences; spoke with SLP this am although pt ate bite of rice krispies this am he will remain on pureed at this time.  Medical Food Supplement Therapy: recommend continuing supplements as ordered Ensure Enlive (each supplement provides 350kcal and 20 grams of protein), Magic cup  NUTRITION DIAGNOSIS:  Inadequate oral intake related to inability to eat, mouth pain as evidenced by per patient/family report, meal completion < 25%  GOAL:  Patient will meet greater than or equal to 90% of their needs; ongoing  MONITOR:   (Energy Intake, Electrolyte and renal Profile, Anthropometrics, Digestive System)  ASSESSMENT:  Pt s/p kayexelate administration, potassium improved.  Current Nutrition: per RN Thayer Ohmhris pt drank ensure and was eating bites from tray this am and not c/o mouth pain. Pt ate applesauce.  Diet Order:  DIET - DYS 1 Room service appropriate?: Yes; Fluid consistency:: Thin Diet - low sodium heart healthy  Gastrointestinal Profile: Last BM: 6/30   Medications: 0.45%NS at 13800mL/hr, MVI, Novolog, Kayexelate given, Sodium bicarbonate  Electrolyte/Renal Profile and Glucose Profile:   Recent Labs Lab 05/09/15 0824 05/09/15 1357 05/09/15 2034 05/10/15 0433  NA 139 142  --  147*  K 6.9* 6.2* 6.4* 4.6  CL 106 111  --  110  CO2 22 20*  --  28  BUN 91* 83*  --  61*  CREATININE 4.05* 3.62*  --  2.73*  CALCIUM 9.9 9.7  --  8.9  GLUCOSE 109* 151*  --  137*   Weight Trend since Admission: Filed Weights   05/09/15 0753 05/09/15 1238 05/10/15 0501  Weight: 130 lb (58.968 kg) 117 lb (53.071 kg) 114 lb (51.71 kg)    BMI:  Body mass index is 17.34 kg/(m^2).  Estimated Nutritional Needs:  Kcal:  1775-2097kcals, BEE: 1345kcals, TEE: (IF 1.1-1.3)(AF 1.2) using IBW of 70kg  Protein:  70-84g protein (1.0-1.2g/kg) using IBW  of 70kg  Fluid:  1750-218000mL of fluid (25-6630mL/kg) using IBW of 70kg  Skin:  Reviewed, no issues  EDUCATION NEEDS:  Education needs no appropriate at this time  HIGH Care Level  Leda QuailAllyson Kariya Lavergne, RD, LDN Pager [phone]

## 2015-05-10 NOTE — Discharge Instructions (Signed)
°  DIET:  °Cardiac diet ° °DISCHARGE CONDITION:  °Stable ° °ACTIVITY:  °Activity as tolerated ° °OXYGEN:  °Home Oxygen: No. °  °Oxygen Delivery: room air ° °DISCHARGE LOCATION:  °nursing home  ° °If you experience worsening of your admission symptoms, develop shortness of breath, life threatening emergency, suicidal or homicidal thoughts you must seek medical attention immediately by calling 911 or calling your MD immediately  if symptoms less severe. ° °You Must read complete instructions/literature along with all the possible adverse reactions/side effects for all the Medicines you take and that have been prescribed to you. Take any new Medicines after you have completely understood and accpet all the possible adverse reactions/side effects.  ° °Please note ° °You were cared for by a hospitalist during your hospital stay. If you have any questions about your discharge medications or the care you received while you were in the hospital after you are discharged, you can call the unit and asked to speak with the hospitalist on call if the hospitalist that took care of you is not available. Once you are discharged, your primary care physician will handle any further medical issues. Please note that NO REFILLS for any discharge medications will be authorized once you are discharged, as it is imperative that you return to your primary care physician (or establish a relationship with a primary care physician if you do not have one) for your aftercare needs so that they can reassess your need for medications and monitor your lab values. ° ° ° °

## 2015-05-10 NOTE — Clinical Social Work Note (Signed)
Clinical Social Work Assessment  Patient Details  Name: Andres [name] MRN: [id number] Date of Birth: 1926/01/22  Date of referral:  05/10/15               Reason for consult:  Facility Placement                Permission sought to share information with:  Family Supports Permission granted to share information::  Yes, Verbal Permission Granted  Name::      Xiomar Crompton, son)   Housing/Transportation Living arrangements for the past 2 months:  [place] of Information:  Patient, Adult Children Patient Interpreter Needed:  None Criminal Activity/Legal Involvement Pertinent to Current Situation/Hospitalization:  No - Comment as needed Significant Relationships:  Adult Children Lives with:  Adult Children Do you feel safe going back to the place where you live?    Need for family participation in patient care:  Yes (Comment)  Care giving concerns:  None reported  Facilities manager / plan:  CSW met with pt address consult. CSW introduced herself and explained role of social work. CSW also explained process of discharging to SNF. PT is recommending SNF. Pt is agreeable, however would like son's input. CSW contacted pt's son, who declined SNF in favor of home health. CSW up [facility] on change in discharge plan. FL2 in on chart in the event of SNF placement. CSW is signing off as no further needs identified.   Employment status:  Retired Therapist, music PT Recommendations:  [place] / Referral to community resources:  [place]  Patient/Family's Response to care:  Pt's son declined SNF.   Patient/Family's Understanding of and Emotional Response to Diagnosis, Current Treatment, and Prognosis:  Pt's son feels that SNF did not benefit pt, and would like home health services.   Emotional Assessment Appearance:  Appears stated age Attitude/Demeanor/Rapport:   (appropriate) Affect (typically observed):   Accepting, Adaptable Orientation:  Oriented to Self, Oriented to Place, Oriented to  Time, Oriented to Situation Alcohol / Substance use:  Never Used Psych involvement (Current and /or in the community):  No (Comment)  Discharge Needs  Concerns to be addressed:  No discharge needs identified Readmission within the last 30 days:  No Current discharge risk:  None Barriers to Discharge:  No Barriers Identified   Darden Dates, LCSW 05/10/2015, 2:10 PM

## 2015-05-10 NOTE — Progress Notes (Signed)
Per MD hold metopolol

## 2015-05-11 LAB — GLUCOSE, CAPILLARY
GLUCOSE-CAPILLARY: 81 mg/dL (ref 65–99)
Glucose-Capillary: 306 mg/dL — ABNORMAL HIGH (ref 65–99)
Glucose-Capillary: 203 mg/dL — ABNORMAL HIGH (ref 65–99)
Glucose-Capillary: 240 mg/dL — ABNORMAL HIGH (ref 65–99)

## 2015-05-11 LAB — HEMOGLOBIN: HEMOGLOBIN: 8.4 g/dL — AB (ref 13.0–18.0)

## 2015-05-11 LAB — BASIC METABOLIC PANEL
Anion gap: 3 — ABNORMAL LOW (ref 5–15)
BUN: 47 mg/dL — AB (ref 6–20)
CO2: 27 mmol/L (ref 22–32)
CREATININE: 1.93 mg/dL — AB (ref 0.61–1.24)
Calcium: 7.9 mg/dL — ABNORMAL LOW (ref 8.9–10.3)
Chloride: 105 mmol/L (ref 101–111)
GFR calc Af Amer: 34 mL/min — ABNORMAL LOW (ref >60)
GFR calc non Af Amer: 30 mL/min — ABNORMAL LOW (ref >60)
Glucose, Bld: 382 mg/dL — ABNORMAL HIGH (ref 65–99)
POTASSIUM: 4.6 mmol/L (ref 3.5–5.1)
Sodium: 135 mmol/L (ref 135–145)

## 2015-05-11 MED ORDER — SODIUM CHLORIDE 0.9 % IV SOLN
INTRAVENOUS | Status: DC
  Administered 2015-05-11: 17:00:00 via INTRAVENOUS
  Administered 2015-05-11: 11:00:00 75 mL/h via INTRAVENOUS
  Administered 2015-05-12: 06:00:00 via INTRAVENOUS

## 2015-05-11 MED ORDER — GLIPIZIDE ER 5 MG PO TB24
5.0000 mg | ORAL_TABLET | Freq: Every day | ORAL | Status: DC
  Administered 2015-05-11 – 2015-05-14 (×4): 5 mg via ORAL
  Filled 2015-05-11 (×4): qty 1

## 2015-05-11 MED ORDER — SODIUM CHLORIDE 0.9 % IV BOLUS (SEPSIS)
500.0000 mL | Freq: Once | INTRAVENOUS | Status: AC
  Administered 2015-05-11: 11:00:00 500 mL via INTRAVENOUS

## 2015-05-11 NOTE — Progress Notes (Signed)
Spoke to Dr Elpidio AnisSudini about BP 83/38 HR48.  Metoprolol held.

## 2015-05-11 NOTE — Plan of Care (Signed)
Problem: Discharge Progression Outcomes Goal: Other Discharge Outcomes/Goals Outcome: Progressing Progression towards Goal: patient with foley intact, large amount of output this shift ( 1500) , remains on iv fluids, and  Iv abt's. No complaints of pain, slept this shift, He is afebrile. High fall risk.

## 2015-05-11 NOTE — Plan of Care (Signed)
Problem: Discharge Progression Outcomes Goal: Other Discharge Outcomes/Goals Outcome: Progressing Progress toward goals: Pain: pt c/o abd pain (cramping) prior to vomiting.  Zofran given with relief from abd cramping and vomiting.   Hemodynamically stable:Bp had decrease to 83/38 HR 48.  NS bolus given.VS afterBolus:  BP 93/41 mmHg  Pulse 72  Temp(Src) 98.1 F (36.7 C) (Oral)  Resp 16  Ht  (1.727 m)  Wt 123 lb 4.8 oz (55.929 kg)  BMI 18.75 kg/m2  SpO2 100%  Diet: pt is eating minimal. Not drinking ensure.  Activity:  Up to chair for lunch.  1 assist back to bed.  Pt has slept most of day.  Awakens easily.

## 2015-05-11 NOTE — Progress Notes (Signed)
[facility] [place] at [facility]   PATIENT NAME: Andres BlitzJoseph [name]    MR#:  [id number]  DATE OF BIRTH:  23-Sep-1926  SUBJECTIVE:  CHIEF COMPLAINT:   Chief Complaint  Patient presents with  . Weakness  Feels well. Eating well. No pain/SOB. On 2 L O2. Says he wears O2 at home. No family at bedside.  REVIEW OF SYSTEMS:    Review of Systems  Constitutional: Negative for fever and chills.  HENT: Negative for sore throat.   Eyes: Negative for blurred vision, double vision and pain.  Respiratory: Negative for cough, hemoptysis, shortness of breath and wheezing.   Cardiovascular: Negative for chest pain, palpitations, orthopnea and leg swelling.  Gastrointestinal: Negative for heartburn, nausea, vomiting, abdominal pain, diarrhea and constipation.  Genitourinary: Negative for dysuria and hematuria.  Musculoskeletal: Negative for back pain and joint pain.  Skin: Negative for rash.  Neurological: Negative for sensory change, speech change, focal weakness and headaches.  Endo/Heme/Allergies: Does not bruise/bleed easily.  Psychiatric/Behavioral: Negative for depression. The patient is not nervous/anxious.       DRUG ALLERGIES:  No Known Allergies  VITALS:  Blood pressure 83/38, pulse 65, temperature 98.1 F (36.7 C), temperature source Oral, resp. rate 16, height 5\' 8"  (1.727 m), weight 55.929 kg (123 lb 4.8 oz), SpO2 100 %.  PHYSICAL EXAMINATION:   Physical Exam  GENERAL:  81 y.o.-year-old patient lying in the bed with no acute distress.  EYES: Pupils equal, round, reactive to light and accommodation. No scleral icterus. Extraocular muscles intact.  HEENT: Head atraumatic, normocephalic. Oropharynx and nasopharynx clear. Hearing loss NECK:  Supple, no jugular venous distention. No thyroid enlargement, no tenderness.  LUNGS: Normal breath sounds bilaterally, no wheezing, rales, rhonchi. No use of accessory muscles of respiration.  CARDIOVASCULAR: S1, S2  normal. No murmurs, rubs, or gallops.  ABDOMEN: Soft, nontender, nondistended. Bowel sounds present. No organomegaly or mass. Foley in place EXTREMITIES: No cyanosis, clubbing or edema b/l.    NEUROLOGIC: Cranial nerves II through XII are intact. No focal Motor or sensory deficits b/l.   PSYCHIATRIC: The patient is alert and awake. SKIN: No obvious rash, lesion, or ulcer.    LABORATORY PANEL:   CBC  Recent Labs Lab 05/10/15 0433 05/11/15 0424  WBC 7.5  --   HGB 8.8* 8.4*  HCT 26.4*  --   PLT 129*  --    ------------------------------------------------------------------------------------------------------------------  Chemistries   Recent Labs Lab 05/09/15 1357  05/11/15 0424  NA 142  < > 135  K 6.2*  < > 4.6  CL 111  < > 105  CO2 20*  < > 27  GLUCOSE 151*  < > 382*  BUN 83*  < > 47*  CREATININE 3.62*  < > 1.93*  CALCIUM 9.7  < > 7.9*  AST 20  --   --   ALT 14*  --   --   ALKPHOS 87  --   --   BILITOT 0.6  --   --   < > = values in this interval not displayed. ------------------------------------------------------------------------------------------------------------------  Cardiac Enzymes No results for input(s): TROPONINI in the last 168 hours. ------------------------------------------------------------------------------------------------------------------  RADIOLOGY:  [place] Renal  05/09/2015   CLINICAL DATA:  Acute renal failure  EXAM: RENAL / URINARY TRACT ULTRASOUND COMPLETE  COMPARISON:  03/01/2013  FINDINGS: Right Kidney:  Length: 11.1 cm. Increased parenchymal echogenicity. Inferior pole cyst measures 1.6 x 1.8 x 1.8 cm. No mass or hydronephrosis.  Left Kidney:  Length: 12.4  cm. Increased parenchymal echogenicity. Cyst within the midpole measures 1.3 x 1.4 x 1.6 cm. No mass or hydronephrosis visualized.  Bladder:  Appears normal for degree of bladder distention.  IMPRESSION: 1. Bilateral echogenic kidneys compatible with chronic medical renal disease.    Electronically Signed   By: Signa Kell M.D.   On: 05/09/2015 17:12     ASSESSMENT AND PLAN:   Andres [name] is a 81 y.o. male with a known history of diabetes, hypertension, CAD, dementia presents to the emergency room brought in by his son for decreased oral intake, dehydration.   *Catheter related UTI with hypotension Started IV ceftriaxone Check Ucx. Get 2 Blood cx. Bolus 1 liter NS stat High risk for detreioration  * ARF over CKD 3 with hyperkalemia Due to severe dehydration. Improving. K is normal now. Good UOP. Repeat BMP tomorrow  * Severe protein calorie malnutrition Due to decreased oral intake. Patient likely has worsening dementia. Add nutritional supplements. Regular diet.  * Hypertension meds held due to hypotension  * DM Restart glipizide SSI  * DVT prophylaxis with heparin.  * AOCD Check hem occult stool  All the records are reviewed and case discussed with Care Management/Social Workerr. Management plans discussed with the patient, family and they are in agreement.  CODE STATUS: DNR  DVT Prophylaxis: SCDs  TOTAL CRITICAL CARE TIME TAKING CARE OF THIS PATIENT: 35 minutes.    Milagros Loll R M.D on 05/11/2015 at 11:36 AM  Between 7am to 6pm - Pager - [phone]  After 6pm go to [email or web address] - password [email or web address] [facility]  [place] [facility]  Office  [phone]  CC: Primary care physician; Tillman Abide, MD

## 2015-05-12 ENCOUNTER — Inpatient Hospital Stay: Payer: Medicare Other

## 2015-05-12 ENCOUNTER — Inpatient Hospital Stay
Admit: 2015-05-12 | Discharge: 2015-05-12 | Disposition: A | Discharge status: Home / Self Care | Payer: Medicare Other | Attending: Internal Medicine | Admitting: Internal Medicine

## 2015-05-12 LAB — BASIC METABOLIC PANEL
ANION GAP: 7 (ref 5–15)
BUN: 31 mg/dL — ABNORMAL HIGH (ref 6–20)
CALCIUM: 8.2 mg/dL — AB (ref 8.9–10.3)
CO2: 25 mmol/L (ref 22–32)
Chloride: 107 mmol/L (ref 101–111)
Creatinine, Ser: 1.69 mg/dL — ABNORMAL HIGH (ref 0.61–1.24)
GFR calc Af Amer: 40 mL/min — ABNORMAL LOW (ref >60)
GFR, EST NON AFRICAN AMERICAN: 35 mL/min — AB (ref >60)
Glucose, Bld: 88 mg/dL (ref 65–99)
POTASSIUM: 4.6 mmol/L (ref 3.5–5.1)
SODIUM: 139 mmol/L (ref 135–145)

## 2015-05-12 LAB — GLUCOSE, CAPILLARY
GLUCOSE-CAPILLARY: 323 mg/dL — AB (ref 65–99)
Glucose-Capillary: 106 mg/dL — ABNORMAL HIGH (ref 65–99)
Glucose-Capillary: 227 mg/dL — ABNORMAL HIGH (ref 65–99)
Glucose-Capillary: 134 mg/dL — ABNORMAL HIGH (ref 65–99)

## 2015-05-12 LAB — HEMOGLOBIN: HEMOGLOBIN: 8 g/dL — AB (ref 13.0–18.0)

## 2015-05-12 MED ORDER — IOHEXOL 240 MG/ML SOLN
25.0000 mL | INTRAMUSCULAR | Status: AC
  Administered 2015-05-12: 12:00:00 via ORAL
  Administered 2015-05-12: 25 mL via ORAL

## 2015-05-12 MED ORDER — FUROSEMIDE 10 MG/ML IJ SOLN
20.0000 mg | Freq: Two times a day (BID) | INTRAMUSCULAR | Status: DC
  Administered 2015-05-12: 20 mg via INTRAVENOUS
  Filled 2015-05-12 (×2): qty 2

## 2015-05-12 MED ORDER — FUROSEMIDE 10 MG/ML IJ SOLN
20.0000 mg | Freq: Once | INTRAMUSCULAR | Status: AC
  Administered 2015-05-12: 13:00:00 20 mg via INTRAVENOUS
  Filled 2015-05-12: qty 2

## 2015-05-12 NOTE — Progress Notes (Signed)
*  PRELIMINARY RESULTS* °Echocardiogram °2D Echocardiogram has been performed. ° °Andres [name] °05/12/2015, 12:23 PM °

## 2015-05-12 NOTE — Plan of Care (Signed)
Problem: Discharge Progression Outcomes Goal: Other Discharge Outcomes/Goals Outcome: Progressing Patient is alert, disoriented to time. HOH, no c/o pain. Son is concerned about poor PO intake, wants to speak with MD. BP remains low, fluids remain at 75 ml/hr of NS. No N/V.

## 2015-05-12 NOTE — Plan of Care (Signed)
Problem: Discharge Progression Outcomes Goal: Other Discharge Outcomes/Goals Outcome: Progressing Progress toward goals: Pain: pt has co/o of abd and chest pain most of day.  Pain meds given as ordered with minimal relief.  Dr Elpidio AnisSudini aware of chest pian.  ABD CT scan ordered. Hemodynamically stable: Reported by tele clerk that the pt had increase HR 103 and slightly elevated ST. Dr Elpidio AnisSudini aware. Diet: Pt eating minimally to nothing.  Vomited after CT scan. Zofran given with good results. Activity: Pt is a one assist to chair.

## 2015-05-12 NOTE — Progress Notes (Signed)
[facility] [place] [facility]   PATIENT NAME: Andres [name]    MR#:  [id number]  DATE OF BIRTH:  Jun 24, 1926  SUBJECTIVE:  CHIEF COMPLAINT:   Chief Complaint  Patient presents with  . Weakness  Complains of bilateral chest pain. Pleuritic. SOB. Nausea. On 4 L O2.  REVIEW OF SYSTEMS:    Review of Systems  Constitutional: Negative for fever and chills.  HENT: Negative for sore throat.   Eyes: Negative for blurred vision, double vision and pain.  Respiratory: Negative for cough, hemoptysis, shortness of breath and wheezing.   Cardiovascular: Negative for chest pain, palpitations, orthopnea and leg swelling.  Gastrointestinal: Negative for heartburn, nausea, vomiting, abdominal pain, diarrhea and constipation.  Genitourinary: Negative for dysuria and hematuria.  Musculoskeletal: Negative for back pain and joint pain.  Skin: Negative for rash.  Neurological: Negative for sensory change, speech change, focal weakness and headaches.  Endo/Heme/Allergies: Does not bruise/bleed easily.  Psychiatric/Behavioral: Negative for depression. The patient is not nervous/anxious.       DRUG ALLERGIES:  No Known Allergies  VITALS:  Blood pressure 94/47, pulse 68, temperature 97.6 F (36.4 C), temperature source Oral, resp. rate 18, height 5\' 8"  (1.727 m), weight 56.155 kg (123 lb 12.8 oz), SpO2 97 %.  PHYSICAL EXAMINATION:   Physical Exam  GENERAL:  81 y.o.-year-old patient lying in the bed. EYES: Pupils equal, round, reactive to light and accommodation. No scleral icterus. Extraocular muscles intact.  HEENT: Head atraumatic, normocephalic. Oropharynx and nasopharynx clear. Hearing loss NECK:  Supple, no jugular venous distention. No thyroid enlargement, no tenderness.  LUNGS: Bilateral crackles. CARDIOVASCULAR: S1, S2 normal. No murmurs, rubs, or gallops.  ABDOMEN: Soft, nontender, nondistended. Bowel sounds present. No organomegaly or mass. Foley in  place EXTREMITIES: No cyanosis, clubbing or edema b/l.    NEUROLOGIC: Cranial nerves II through XII are intact. No focal Motor or sensory deficits b/l.   PSYCHIATRIC: The patient is alert and awake. SKIN: No obvious rash, lesion, or ulcer.    LABORATORY PANEL:   CBC  Recent Labs Lab 05/10/15 0433  05/12/15 0517  WBC 7.5  --   --   HGB 8.8*  < > 8.0*  HCT 26.4*  --   --   PLT 129*  --   --   < > = values in this interval not displayed. ------------------------------------------------------------------------------------------------------------------  Chemistries   Recent Labs Lab 05/09/15 1357  05/12/15 0517  NA 142  < > 139  K 6.2*  < > 4.6  CL 111  < > 107  CO2 20*  < > 25  GLUCOSE 151*  < > 88  BUN 83*  < > 31*  CREATININE 3.62*  < > 1.69*  CALCIUM 9.7  < > 8.2*  AST 20  --   --   ALT 14*  --   --   ALKPHOS 87  --   --   BILITOT 0.6  --   --   < > = values in this interval not displayed. ------------------------------------------------------------------------------------------------------------------  Cardiac Enzymes No results for input(s): TROPONINI in the last 168 hours. ------------------------------------------------------------------------------------------------------------------  RADIOLOGY:  Dg Chest 2 View  05/12/2015   CLINICAL DATA:  Renal failure.  Chest pain.  EXAM: CHEST  2 VIEW  COMPARISON:  03/02/2013.  FINDINGS: Cardiopericardial silhouette is within normal limits for projection. Dual lead LEFT subclavian pacemaker. Monitoring leads project over the chest.  There is interstitial and alveolar pulmonary edema. Kerley B-lines are present at the  periphery of the bases. Small bilateral pleural effusions are evident layering posteriorly on the lateral projection. Thickening of the fissures is evident.  IMPRESSION: Findings compatible with CHF/volume overload with bilateral pleural effusions, interstitial and mild alveolar pulmonary edema.   Electronically  Signed   By: Andreas Newport M.D.   On: 05/12/2015 10:20     ASSESSMENT AND PLAN:   Andres [name] is a 81 y.o. male with a known history of diabetes, hypertension, CAD, dementia presents to the emergency room brought in by his son for decreased oral intake, dehydration.   * Acute on chronic diastolic CHF STAT lasix 20 mg IV once. Then scheduled. Check ECHO. I/Os Stop IVF  *Catheter related UTI with hypotension Started IV ceftriaxone Ucx pending  * ARF over CKD 3 with hyperkalemia Due to severe dehydration. Improving.  Stop IVF due to CHF  * Severe protein calorie malnutrition Due to decreased oral intake. Patient likely has worsening dementia. Add nutritional supplements. Regular diet.  * Hypertension meds held due to hypotension  * DM Restart glipizide SSI  * DVT prophylaxis with heparin.  * AOCD Check hem occult stool  * Weight loss and anemia Check Ct bad/pelvis. Cancer?  All the records are reviewed and case discussed with Care Management/Social Workerr. Management plans discussed with the patient, family and they are in agreement.  CODE STATUS: DNR  DVT Prophylaxis: SCDs  TOTAL CRITICAL CARE TIME TAKING CARE OF THIS PATIENT: 40 minutes.    Milagros Loll R M.D on 05/12/2015 at 11:35 AM  Between 7am to 6pm - Pager - [phone]  After 6pm go to [email or web address] - password [email or web address] [facility]  [place] [facility]  Office  [phone]  CC: Primary care physician; Tillman Abide, MD

## 2015-05-12 NOTE — Progress Notes (Signed)
Received cal form tele clerk that patinet's HR increased to 103 with a slight ST elevation.  This is different form this am.  Dr Elpidio AnisSudini notified of above information.  Stat EKG ordered

## 2015-05-13 LAB — GLUCOSE, CAPILLARY
GLUCOSE-CAPILLARY: 120 mg/dL — AB (ref 65–99)
Glucose-Capillary: 74 mg/dL (ref 65–99)
Glucose-Capillary: 158 mg/dL — ABNORMAL HIGH (ref 65–99)
Glucose-Capillary: 292 mg/dL — ABNORMAL HIGH (ref 65–99)

## 2015-05-13 LAB — BASIC METABOLIC PANEL
ANION GAP: 6 (ref 5–15)
BUN: 29 mg/dL — ABNORMAL HIGH (ref 6–20)
CHLORIDE: 104 mmol/L (ref 101–111)
CO2: 30 mmol/L (ref 22–32)
CREATININE: 1.74 mg/dL — AB (ref 0.61–1.24)
Calcium: 8.5 mg/dL — ABNORMAL LOW (ref 8.9–10.3)
GFR calc non Af Amer: 34 mL/min — ABNORMAL LOW (ref >60)
GFR, EST AFRICAN AMERICAN: 39 mL/min — AB (ref >60)
Glucose, Bld: 69 mg/dL (ref 65–99)
Potassium: 4.2 mmol/L (ref 3.5–5.1)
SODIUM: 140 mmol/L (ref 135–145)

## 2015-05-13 LAB — HEMOGLOBIN: Hemoglobin: 9 g/dL — ABNORMAL LOW (ref 13.0–18.0)

## 2015-05-13 MED ORDER — FUROSEMIDE 20 MG PO TABS
20.0000 mg | ORAL_TABLET | Freq: Two times a day (BID) | ORAL | Status: DC
  Administered 2015-05-14: 20 mg via ORAL
  Filled 2015-05-13: qty 1

## 2015-05-13 MED ORDER — POLYETHYLENE GLYCOL 3350 17 G PO PACK
17.0000 g | PACK | Freq: Every day | ORAL | Status: DC
  Administered 2015-05-13 – 2015-05-14 (×2): 17 g via ORAL
  Filled 2015-05-13 (×2): qty 1

## 2015-05-13 NOTE — Progress Notes (Signed)
Speech Language Pathology Treatment: Dysphagia  Patient Details Name: Andres [name] MRN: [id number] DOB: November 28, 1925 Today's Date: 05/13/2015 Time: [id number] SLP Time Calculation (min) (ACUTE ONLY): 48 min  Assessment / Plan / Recommendation Clinical Impression  Pt appeared to safely tolerate trials of puree and consumed ~50% of the meat and vegetable during the meal today; he drank all of the Ensure drink supplement. He only took a few small bites of the dessert and fruit stating he was "full". Suspect pt does not feel the need to eat large amounts at one time and is not hungry for such. Pt appears at reduced risk for aspiration. No oral phase deficits were noted w/ this consistency of diet. As pt is able to eat and safely tolerate this diet, rec. continue w/ Purees foods and thin liquids(Ensure supplement) w/ introduction of soft solids; more puree foods may allow for pt to have increased oral intake overall d/t the ease of eating in regard to the oral phase. General aspiration precautions and meds in puree as nec. ST will f/u w/ continued trials of soft solids next 1-2 days.   HPI Other Pertinent Information: Per chart review, pt is a 81 y.o. male with a known history of diabetes, hypertension, CAD, dementia who presented to the emergency room brought in by his son for decreased oral intake, dehydration. Pt c/o of mouth pain since having an oral ulcer d/t ill-fitting dentures. The dentures have been refitted per report and the ulcer healed. Pt denies any significant mouth pain, although, his oral intake has been less overall per report.   Pertinent Vitals Pain Assessment: No/denies pain  SLP Plan  Continue with current plan of care    Recommendations Diet recommendations: Dysphagia 1 (puree);Thin liquid Liquids provided via: Cup;Straw Medication Administration: Whole meds with liquid (w/ puree as Arther Damesnec.) Supervision: Patient able to self feed (w/ setup at meals) Compensations: Slow rate;Small  sips/bites Postural Changes and/or Swallow Maneuvers: Seated upright 90 degrees              General recommendations:  (TBD) Oral Care Recommendations: Oral care BID;Oral care before and after PO;Staff/trained caregiver to provide oral care (assist) Follow up Recommendations: Skilled Nursing facility (TBD) Plan: Continue with current plan of care    GO     Andres [name] 05/13/2015, 1:42 PM

## 2015-05-13 NOTE — Plan of Care (Addendum)
Problem: Discharge Progression Outcomes Goal: Other Discharge Outcomes/Goals Outcome: Progressing Plan of care progress to goal:  Pt up to chair with nursing staff, 1 assist. Ate much better for lunch, 50% of meal. IV ABX infusing. On tele, V paced with BBB. Weaned to 1L O2 [place]. C/o pain 1x, oxycodone given. Patient stated relief. Afebrile. Miralax and colace given. Still waiting on bowel movement. Possible SNF placement, if son and patient agrees.

## 2015-05-13 NOTE — Plan of Care (Addendum)
Problem: Discharge Progression Outcomes Goal: Other Discharge Outcomes/Goals Outcome: Progressing Plan of care progress to goal for: 1. Pain-no c/o pain this shift 2. Hemodynamically-             -VSS, pt remains afebrile this shift  -chronic foley in place with good urine output 3. Complications-no evidence of this shift 4. Diet-pt has poor po intake 5. Activity-pt is 1 assist to the BR

## 2015-05-13 NOTE — Progress Notes (Signed)
[facility] - [place] at [facility]   PATIENT NAME: Andres [name]    MR#:  [id number]  DATE OF BIRTH:  Jul 27, 1926  SUBJECTIVE:  CHIEF COMPLAINT:   Chief Complaint  Patient presents with  . Weakness   Chest pain is resolved. No nausea. Feels weak.  REVIEW OF SYSTEMS:    Review of Systems  Constitutional: Negative for fever and chills.  HENT: Negative for sore throat.   Eyes: Negative for blurred vision, double vision and pain.  Respiratory: Negative for cough, hemoptysis, shortness of breath and wheezing.   Cardiovascular: Negative for chest pain, palpitations, orthopnea and leg swelling.  Gastrointestinal: Negative for heartburn, nausea, vomiting, abdominal pain, diarrhea and constipation.  Genitourinary: Negative for dysuria and hematuria.  Musculoskeletal: Negative for back pain and joint pain.  Skin: Negative for rash.  Neurological: Negative for sensory change, speech change, focal weakness and headaches.  Endo/Heme/Allergies: Does not bruise/bleed easily.  Psychiatric/Behavioral: Negative for depression. The patient is not nervous/anxious.       DRUG ALLERGIES:  No Known Allergies  VITALS:  Blood pressure 90/50, pulse 75, temperature 97.8 F (36.6 C), temperature source Oral, resp. rate 18, height  (1.727 m), weight 57.471 kg (126 lb 11.2 oz), SpO2 100 %.  PHYSICAL EXAMINATION:   Physical Exam  GENERAL:  81 y.o.-year-old patient lying in the bed. EYES: Pupils equal, round, reactive to light and accommodation. No scleral icterus. Extraocular muscles intact.  HEENT: Head atraumatic, normocephalic. Oropharynx and nasopharynx clear. Hearing loss NECK:  Supple, no jugular venous distention. No thyroid enlargement, no tenderness.  LUNGS: Bilateral crackles. CARDIOVASCULAR: S1, S2 normal. No murmurs, rubs, or gallops.  ABDOMEN: Soft, nontender, nondistended. Bowel sounds present. No organomegaly or mass. Foley in place EXTREMITIES: No  cyanosis, clubbing or edema b/l.    NEUROLOGIC: Cranial nerves II through XII are intact. No focal Motor or sensory deficits b/l.   PSYCHIATRIC: The patient is alert and awake. SKIN: No obvious rash, lesion, or ulcer.    LABORATORY PANEL:   CBC  Recent Labs Lab 05/10/15 0433  05/13/15 0540  WBC 7.5  --   --   HGB 8.8*  < > 9.0*  HCT 26.4*  --   --   PLT 129*  --   --   < > = values in this interval not displayed. ------------------------------------------------------------------------------------------------------------------  Chemistries   Recent Labs Lab 05/09/15 1357  05/13/15 0540  NA 142  < > 140  K 6.2*  < > 4.2  CL 111  < > 104  CO2 20*  < > 30  GLUCOSE 151*  < > 69  BUN 83*  < > 29*  CREATININE 3.62*  < > 1.74*  CALCIUM 9.7  < > 8.5*  AST 20  --   --   ALT 14*  --   --   ALKPHOS 87  --   --   BILITOT 0.6  --   --   < > = values in this interval not displayed. ------------------------------------------------------------------------------------------------------------------  Cardiac Enzymes No results for input(s): TROPONINI in the last 168 hours. ------------------------------------------------------------------------------------------------------------------  RADIOLOGY:  Ct Abdomen Pelvis Wo Contrast  05/12/2015   CLINICAL DATA:  Weight loss, mid abdominal pain, anorexia, dehydration, acute on chronic renal failure, catheter due to TI with hypotension, past history coronary artery disease post MI, GERD, hypertension, diabetes mellitus  EXAM: CT ABDOMEN AND PELVIS WITHOUT CONTRAST  TECHNIQUE: Multidetector CT imaging of the abdomen and pelvis was performed following the standard  protocol without IV contrast.  COMPARISON:  03/01/2013  FINDINGS: BILATERAL pleural effusions and basilar atelectasis with probable additional infiltrates in both lower lobes.  Scattered pleural calcifications bilaterally.  Atherosclerotic calcifications aorta and coronary arteries.   Pacemaker leads at RIGHT atrium and RIGHT ventricle.  Gallbladder surgically absent.  Small peripelvic RIGHT renal cysts.  No definite ureteral calcification or dilatation.  Foley catheter within urinary bladder.  Within limits of a nonenhanced exam no focal abnormalities of the liver, spleen, pancreas, or adrenal glands.  Increased stool in rectum.  Distal colonic diverticulosis without evidence of diverticulitis.  Gaseous distention of stomach.  Bowel loops otherwise unremarkable.  No mass, adenopathy, free fluid, free air or acute inflammatory process.  Bones demineralized.  IMPRESSION: Distal colonic diverticulosis.  Suspect prior asbestos exposure with bibasilar pleural effusions, atelectasis and questionable lower lobe infiltrates.  Extensive atherosclerotic disease.   Electronically Signed   By: Ulyses SouthwardMark  Boles M.D.   On: 05/12/2015 15:52   Dg Chest 2 View  05/12/2015   CLINICAL DATA:  Renal failure.  Chest pain.  EXAM: CHEST  2 VIEW  COMPARISON:  03/02/2013.  FINDINGS: Cardiopericardial silhouette is within normal limits for projection. Dual lead LEFT subclavian pacemaker. Monitoring leads project over the chest.  There is interstitial and alveolar pulmonary edema. Kerley B-lines are present at the periphery of the bases. Small bilateral pleural effusions are evident layering posteriorly on the lateral projection. Thickening of the fissures is evident.  IMPRESSION: Findings compatible with CHF/volume overload with bilateral pleural effusions, interstitial and mild alveolar pulmonary edema.   Electronically Signed   By: Andreas NewportGeoffrey  Lamke M.D.   On: 05/12/2015 10:20     ASSESSMENT AND PLAN:   Andres [name] is a 81 y.o. male with a known history of diabetes, hypertension, CAD, dementia presents to the emergency room brought in by his son for decreased oral intake, dehydration.   * Acute on chronic diastolic CHF Improving on lasix. Change to PO  *Catheter related UTI with hypotension Started IV  ceftriaxone Ucx pending  * ARF over CKD 3 with hyperkalemia Due to severe dehydration. Improving.  Stopped IVF due to CHF  * Severe protein calorie malnutrition Due to decreased oral intake. Patient likely has worsening dementia. Add nutritional supplements. Regular diet.  * Hypertension meds held due to hypotension  * DM Restart glipizide SSI  * DVT prophylaxis with heparin.  * AOCD Check hem occult stool  * Weight loss and anemia Ct abdomen showed nothing acute. No mass.   Discussed with son. SNF if no improvement in appetite and still weak. May transition to hospice home if no improvement.  All the records are reviewed and case discussed with Care Management/Social Workerr. Management plans discussed with the patient, family and they are in agreement.  CODE STATUS: DNR  DVT Prophylaxis: SCDs  TOTAL  TIME TAKING CARE OF THIS PATIENT: 40 minutes.    Milagros LollSudini, Symeon Puleo R M.D on 05/13/2015 at 11:25 AM  Between 7am to 6pm - Pager - [phone]  After 6pm go to [email or web address] - password [email or web address] [facility]  [facility]  Office  [phone]  CC: Primary care physician; Tillman Abideichard Letvak, MD

## 2015-05-13 NOTE — Care Management (Signed)
Spoke with Dr. Elpidio AnisSudini this morning. States that the son, Jomarie LongsJoseph, has reconsidered. If his father does not improve he would like him to go to a skilled facility. Possible get Hospice involved, if appetite doesn't improve. May be ready for discharge tomorrow. IM issued. Gwenette GreetBrenda S Holland RN MSN Care Management [phone]

## 2015-05-14 ENCOUNTER — Telehealth: Payer: Self-pay | Admitting: *Deleted

## 2015-05-14 ENCOUNTER — Encounter: Payer: Self-pay | Admitting: Internal Medicine

## 2015-05-14 DIAGNOSIS — F0150 Vascular dementia without behavioral disturbance: Secondary | ICD-10-CM

## 2015-05-14 DIAGNOSIS — E43 Unspecified severe protein-calorie malnutrition: Secondary | ICD-10-CM | POA: Diagnosis not present

## 2015-05-14 DIAGNOSIS — M6281 Muscle weakness (generalized): Secondary | ICD-10-CM | POA: Diagnosis not present

## 2015-05-14 DIAGNOSIS — I5032 Chronic diastolic (congestive) heart failure: Secondary | ICD-10-CM | POA: Diagnosis not present

## 2015-05-14 DIAGNOSIS — I10 Essential (primary) hypertension: Secondary | ICD-10-CM | POA: Diagnosis not present

## 2015-05-14 DIAGNOSIS — I5023 Acute on chronic systolic (congestive) heart failure: Secondary | ICD-10-CM | POA: Diagnosis not present

## 2015-05-14 DIAGNOSIS — N179 Acute kidney failure, unspecified: Secondary | ICD-10-CM | POA: Diagnosis not present

## 2015-05-14 DIAGNOSIS — T8351XA Infection and inflammatory reaction due to indwelling urinary catheter, initial encounter: Secondary | ICD-10-CM | POA: Diagnosis not present

## 2015-05-14 DIAGNOSIS — E875 Hyperkalemia: Secondary | ICD-10-CM | POA: Diagnosis not present

## 2015-05-14 DIAGNOSIS — I5033 Acute on chronic diastolic (congestive) heart failure: Secondary | ICD-10-CM | POA: Diagnosis not present

## 2015-05-14 DIAGNOSIS — N170 Acute kidney failure with tubular necrosis: Secondary | ICD-10-CM | POA: Diagnosis not present

## 2015-05-14 DIAGNOSIS — I959 Hypotension, unspecified: Secondary | ICD-10-CM | POA: Diagnosis not present

## 2015-05-14 LAB — PLATELET COUNT: Platelets: 156 10*3/uL (ref 150–440)

## 2015-05-14 LAB — GLUCOSE, CAPILLARY
Glucose-Capillary: 101 mg/dL — ABNORMAL HIGH (ref 65–99)
Glucose-Capillary: 265 mg/dL — ABNORMAL HIGH (ref 65–99)

## 2015-05-14 MED ORDER — BISACODYL 10 MG RE SUPP
10.0000 mg | Freq: Once | RECTAL | Status: AC
  Administered 2015-05-14: 10 mg via RECTAL
  Filled 2015-05-14: qty 1

## 2015-05-14 MED ORDER — MINERAL OIL RE ENEM
1.0000 | ENEMA | Freq: Once | RECTAL | Status: AC
  Administered 2015-05-14: 11:00:00 1 via RECTAL

## 2015-05-14 MED ORDER — FLEET ENEMA 7-19 GM/118ML RE ENEM: 1.0000 | ENEMA | Freq: Once | RECTAL | Status: DC

## 2015-05-14 MED ORDER — CIPROFLOXACIN HCL 250 MG PO TABS: 250.0000 mg | ORAL_TABLET | Freq: Two times a day (BID) | ORAL | Status: AC

## 2015-05-14 MED ORDER — LACTULOSE 10 GM/15ML PO SOLN
20.0000 g | Freq: Once | ORAL | Status: AC
  Administered 2015-05-14: 11:00:00 20 g via ORAL
  Filled 2015-05-14: qty 30

## 2015-05-14 MED ORDER — METOPROLOL TARTRATE 25 MG PO TABS: 12.5000 mg | ORAL_TABLET | Freq: Two times a day (BID) | ORAL | Status: DC

## 2015-05-14 NOTE — Discharge Summary (Signed)
[facility] [place] [facility]   PATIENT NAME: Andres [name]    MR#:  [id number]  DATE OF BIRTH:  1926/11/02  DATE OF ADMISSION:  05/09/2015 ADMITTING PHYSICIAN: Milagros LollSrikar Naya Ilagan, MD  DATE OF DISCHARGE: No discharge date for patient encounter.  PRIMARY CARE PHYSICIAN: Tillman Abideichard Letvak, MD    ADMISSION DIAGNOSIS:  Hyperkalemia [E87.5] Acute renal failure, unspecified acute renal failure type [N17.9]  DISCHARGE DIAGNOSIS:  Active Problems:   ARF (acute renal failure)   Protein-calorie malnutrition, severe   UTI (urinary tract infection) due to urinary indwelling catheter   Hyperkalemia   Acute on chronic diastolic heart failure   Dementia   SECONDARY DIAGNOSIS:   Past Medical History  Diagnosis Date  . CAD (coronary artery disease)   . Diverticulitis   . GERD (gastroesophageal reflux disease)   . Hyperlipidemia   . Hypertension   . BPH (benign prostatic hypertrophy)   . Diabetes mellitus   . DJD (degenerative joint disease)     lumbar spine  . IBS (irritable bowel syndrome)   . Stroke   . Depression 2012  . Acute MI 09/2012  . Chronic kidney disease     ACUTE RENAL FAILURE  . Foley catheter in place 05/09/15    for 1 year per family     ADMITTING HISTORY  Andres BlitzJoseph Lingle is a 81 y.o. male with a known history of diabetes, hypertension, CAD, dementia presents to the emergency room brought in by his son for decreased oral intake, dehydration. Patient initially developed an ulcer in his mouth from his dentures which caused decreased oral intake. Even after the ulcer has healed patient has continued to not eat well which prompted his son to bring the patient to the hospital. Here patient's main complaint is some mild abdominal pain which is chronic per his son. No complaints of trouble swallowing. No shortness of breath or vomiting or diarrhea. No fever. He has been found to have acute renal failure with creatinine of 4.05 with potassium of 6.9  and is being admitted to the hospitalist service. He has a chronic indwelling Foley catheter due to benign prostatic hypertrophy.   HOSPITAL COURSE:   Andres BlitzJoseph Broad is a 81 y.o. male with a known history of diabetes, hypertension, CAD, dementia presents to the emergency room brought in by his son for decreased oral intake, dehydration.   * Acute on chronic diastolic CHF Improving on lasix. Changed to PO. Will stop lasix as he has poor PO intake. Can restart as OP if needed.  *Catheter related UTI with hypotension Started IV ceftriaxone. Change to Cipro PO Ucx negative Bcx negative  * ARF over CKD 3 with hyperkalemia Due to severe dehydration. Improving.  Stopped IVF due to CHF  * Severe protein calorie malnutrition Due to decreased oral intake. Patient likely has worsening dementia. Add nutritional supplements. Regular diet.  * Hypertension meds held due to hypotension  * DM Restart glipizide SSI  * DVT prophylaxis with heparin.  * AOCD Stable  * Weight loss and anemia Ct abdomen showed nothing acute. No mass.  Discussed with son. SNF if no improvement in appetite and still weak. May transition to hospice home if no improvement at SNF.  Stable at time of discharge   CONSULTS OBTAINED:     DRUG ALLERGIES:  No Known Allergies  DISCHARGE MEDICATIONS:   Current Discharge Medication List    START taking these medications   Details  albuterol (PROVENTIL) (2.5 MG/3ML) 0.083% nebulizer solution Take  3 mLs (2.5 mg total) by nebulization every 2 (two) hours as needed for wheezing. Qty: 75 mL, Refills: 12    ciprofloxacin (CIPRO) 250 MG tablet Take 1 tablet (250 mg total) by mouth 2 (two) times daily. Qty: 10 tablet, Refills: 0    Multiple Vitamin (MULTIVITAMIN WITH MINERALS) TABS tablet Take 1 tablet by mouth daily.      CONTINUE these medications which have CHANGED   Details  metoprolol tartrate (LOPRESSOR) 25 MG tablet Take 0.5 tablets (12.5 mg total)  by mouth 2 (two) times daily. Qty: 180 tablet, Refills: 1      CONTINUE these medications which have NOT CHANGED   Details  aspirin 81 MG tablet Take 81 mg by mouth daily.      atorvastatin (LIPITOR) 10 MG tablet Take 1 tablet by mouth  daily Qty: 90 tablet, Refills: 3    citalopram (CELEXA) 10 MG tablet Take 1 tablet by mouth  daily Qty: 90 tablet, Refills: 1    clopidogrel (PLAVIX) 75 MG tablet Take 1 tablet by mouth  daily Qty: 90 tablet, Refills: 3    docusate sodium (COLACE) 100 MG capsule Take 100 mg by mouth 2 (two) times daily.    finasteride (PROSCAR) 5 MG tablet Take 1 tablet by mouth  daily Qty: 90 tablet, Refills: 3    fluticasone (FLONASE) 50 MCG/ACT nasal spray Place 2 sprays into the nose daily. Qty: 16 g, Refills: 1    GLIPIZIDE XL 5 MG 24 hr tablet Take 1 tablet (5 mg total)  by mouth daily. Qty: 90 tablet, Refills: 3    metFORMIN (GLUCOPHAGE) 1000 MG tablet Take one-half tablet by  mouth two times daily with  meals Qty: 90 tablet, Refills: 3    nitroGLYCERIN (NITROSTAT) 0.4 MG SL tablet Place 0.4 mg under the tongue every 5 (five) minutes as needed.    triamcinolone cream (KENALOG) 0.1 % Apply 1 application topically 2 (two) times daily as needed. For itchy areas Qty: 30 g, Refills: 2    feeding supplement (BOOST HIGH PROTEIN) LIQD Take 1 Container by mouth daily.      STOP taking these medications     isosorbide mononitrate (IMDUR) 30 MG 24 hr tablet      lisinopril (PRINIVIL,ZESTRIL) 5 MG tablet          Today    VITAL SIGNS:  Blood pressure 103/46, pulse 80, temperature 98.8 F (37.1 C), temperature source Oral, resp. rate 18, height 5\' 8"  (1.727 m), weight 55.021 kg (121 lb 4.8 oz), SpO2 97 %.  I/O:   Intake/Output Summary (Last 24 hours) at 05/14/15 1231 Last data filed at 05/14/15 0745  Gross per 24 hour  Intake    200 ml  Output    600 ml  Net   -400 ml    PHYSICAL EXAMINATION:  Physical Exam  GENERAL:  81 y.o.-year-old  patient lying in the bed with no acute distress. Decreased hearing LUNGS: Normal breath sounds bilaterally, no wheezing, rales,rhonchi or crepitation. No use of accessory muscles of respiration.  CARDIOVASCULAR: S1, S2 normal. No murmurs, rubs, or gallops.  ABDOMEN: Soft, non-tender, non-distended. Bowel sounds present. No organomegaly or mass.  NEUROLOGIC: Moves all 4 extremities. PSYCHIATRIC: The patient is alert SKIN: No obvious rash, lesion, or ulcer.   DATA REVIEW:   CBC  Recent Labs Lab 05/10/15 0433  05/13/15 0540 05/14/15 0427  WBC 7.5  --   --   --   HGB 8.8*  < > 9.0*  --  HCT 26.4*  --   --   --   PLT 129*  --   --  156  < > = values in this interval not displayed.  Chemistries   Recent Labs Lab 05/09/15 1357  05/13/15 0540  NA 142  < > 140  K 6.2*  < > 4.2  CL 111  < > 104  CO2 20*  < > 30  GLUCOSE 151*  < > 69  BUN 83*  < > 29*  CREATININE 3.62*  < > 1.74*  CALCIUM 9.7  < > 8.5*  AST 20  --   --   ALT 14*  --   --   ALKPHOS 87  --   --   BILITOT 0.6  --   --   < > = values in this interval not displayed.  Cardiac Enzymes No results for input(s): TROPONINI in the last 168 hours.  Microbiology Results  Results for orders placed or performed during the hospital encounter of 05/09/15  Culture, blood (routine x 2)     Status: None (Preliminary result)   Collection Time: 05/11/15 11:39 AM  Result Value Ref Range Status   Specimen Description BLOOD  Final   Special Requests NONE  Final   Culture NO GROWTH 3 DAYS  Final   Report Status PENDING  Incomplete  Culture, blood (routine x 2)     Status: None (Preliminary result)   Collection Time: 05/11/15 11:52 AM  Result Value Ref Range Status   Specimen Description BLOOD  Final   Special Requests NONE  Final   Culture NO GROWTH 3 DAYS  Final   Report Status PENDING  Incomplete    RADIOLOGY:  Ct Abdomen Pelvis Wo Contrast  05/12/2015   CLINICAL DATA:  Weight loss, mid abdominal pain, anorexia,  dehydration, acute on chronic renal failure, catheter due to TI with hypotension, past history coronary artery disease post MI, GERD, hypertension, diabetes mellitus  EXAM: CT ABDOMEN AND PELVIS WITHOUT CONTRAST  TECHNIQUE: Multidetector CT imaging of the abdomen and pelvis was performed following the standard protocol without IV contrast.  COMPARISON:  03/01/2013  FINDINGS: BILATERAL pleural effusions and basilar atelectasis with probable additional infiltrates in both lower lobes.  Scattered pleural calcifications bilaterally.  Atherosclerotic calcifications aorta and coronary arteries.  Pacemaker leads at RIGHT atrium and RIGHT ventricle.  Gallbladder surgically absent.  Small peripelvic RIGHT renal cysts.  No definite ureteral calcification or dilatation.  Foley catheter within urinary bladder.  Within limits of a nonenhanced exam no focal abnormalities of the liver, spleen, pancreas, or adrenal glands.  Increased stool in rectum.  Distal colonic diverticulosis without evidence of diverticulitis.  Gaseous distention of stomach.  Bowel loops otherwise unremarkable.  No mass, adenopathy, free fluid, free air or acute inflammatory process.  Bones demineralized.  IMPRESSION: Distal colonic diverticulosis.  Suspect prior asbestos exposure with bibasilar pleural effusions, atelectasis and questionable lower lobe infiltrates.  Extensive atherosclerotic disease.   Electronically Signed   By: Ulyses Southward M.D.   On: 05/12/2015 15:52      Follow up with PCP in 1 week.  Management plans discussed with the patient, family and they are in agreement.  CODE STATUS:     Code Status Orders        Start     Ordered   05/09/15 1149  Do not attempt resuscitation (DNR)   Continuous    Question Answer Comment  In the event of cardiac or respiratory ARREST Do not  call a "code blue"   In the event of cardiac or respiratory ARREST Do not perform Intubation, CPR, defibrillation or ACLS   In the event of cardiac or  respiratory ARREST Use medication by any route, position, wound care, and other measures to relive pain and suffering. May use oxygen, suction and manual treatment of airway obstruction as needed for comfort.      05/09/15 1148    Advance Directive Documentation        Most Recent Value   Type of Advance Directive  Healthcare Power of Attorney   Pre-existing out of facility DNR order (yellow form or pink MOST form)     "MOST" Form in Place?        TOTAL TIME TAKING CARE OF THIS PATIENT ON DAY OF DISCHARGE: more than 30 minutes.    Milagros Loll R M.D on 05/14/2015 at 12:31 PM  Between 7am to 6pm - Pager - [phone]  After 6pm go to [email or web address] - password [email or web address] [facility]  [facility]  Office  [phone]  CC: Primary care physician; Tillman Abide, MD

## 2015-05-14 NOTE — Clinical Social Work Placement (Signed)
   CLINICAL SOCIAL WORK PLACEMENT  NOTE  Date:  05/14/2015  Patient Details  Name: Andres [name] MRN: [id number] Date of Birth: 04/14/1926  Clinical Social Work is seeking post-discharge placement for this patient at the Skilled  Nursing Facility level of care (*CSW will initial, date and re-position this form in  chart as items are completed):  Yes   Patient/family provided with [facility] Clinical Social Work Department's list of facilities offering this level of care within the geographic area requested by the patient (or if unable, by the patient's family).  Yes   Patient/family informed of their freedom to choose among providers that offer the needed level of care, that participate in Medicare, Medicaid or managed care program needed by the patient, have an available bed and are willing to accept the patient.  Yes   Patient/family informed of [facility]'s ownership interest in [facility] and [facility], as well as of the fact that they are under no obligation to receive care at these facilities.  PASRR submitted to EDS on       PASRR number received on       Existing PASRR number confirmed on 05/10/15     FL2 transmitted to all facilities in geographic area requested by pt/family on 05/10/15     FL2 transmitted to all facilities within larger geographic area on       Patient informed that his/her managed care company has contracts with or will negotiate with certain facilities, including the following:        Yes   Patient/family informed of bed offers received.  Patient [facility] bed at  [facility]     Physician recommends and patient chooses bed at  [facility](SNF)    Patient to be transferred to  [facility]) on 05/14/15.  Patient to be transferred to facility by  [facility]     Patient family notified on 05/14/15 of transfer.  Name of family member notified:   (pt's son, Aurelio BrashJoey, and pt's daughter in Social workerlaw, Gavin PoundDeborah)     PHYSICIAN        Additional Comment:    _______________________________________________ Dede QuerySarah Lesley Galentine, LCSW 05/14/2015, 2:30 PM

## 2015-05-14 NOTE — Progress Notes (Signed)
Nutrition Follow-up  DOCUMENTATION CODES:  Severe malnutrition in context of acute illness/injury  INTERVENTION:  Meals and Snacks: Cater to patient preferences; SLP following Medical Food Supplement Therapy: Continue supplements as ordered. Ensure Enlive (each supplement provides 350kcal and 20 grams of protein), Magic cup  NUTRITION DIAGNOSIS:  Inadequate oral intake related to inability to eat, mouth pain as evidenced by per patient/family report, meal completion < 25%.  GOAL:  Patient will meet greater than or equal to 90% of their needs  MONITOR:   (Energy Intake, Electrolyte and renal Profile, Anthropometrics, Digestive System)   ASSESSMENT:  Pt reports pain in mouth is better this am. Per MD note, CT scan of abdomen was negative. Per MD order, d/c to SNF when bed available.  Diet Order:  DIET - DYS 1 Room service appropriate?: Yes; Fluid consistency:: Thin Diet - low sodium heart healthy Diet general  Current Nutrition: Pt ate 100% of waffle and apple juice this am. Pt reports wanting to wait a little bit before drinking Ensure. Pt does not remember if he ate dinner last night. Yesterday pt ate 50% of meat/vegetable and 100% of Ensure with SLP present per note.    Gastrointestinal Profile: Last BM: 6/30   Medications: Lasix, Glucotrol, Colace, Novolog, Miralax, MVI, mineral oil enema  Electrolyte/Renal Profile and Glucose Profile:   Recent Labs Lab 05/11/15 0424 05/12/15 0517 05/13/15 0540  NA 135 139 140  K 4.6 4.6 4.2  CL 105 107 104  CO2 27 25 30   BUN 47* 31* 29*  CREATININE 1.93* 1.69* 1.74*  CALCIUM 7.9* 8.2* 8.5*  GLUCOSE 382* 88 69   Protein Profile:  Recent Labs Lab 05/09/15 1357  ALBUMIN 3.6   Nutritional Anemia Profile:  CBC Latest Ref Rng 05/14/2015 05/13/2015 05/12/2015  WBC 3.8 - 10.6 K/uL - - -  Hemoglobin 13.0 - 18.0 g/dL - 9.0(L) 8.0(L)  Hematocrit 40.0 - 52.0 % - - -  Platelets 150 - 440 K/uL 156 - -    Weight Trend since  Admission: Filed Weights   05/12/15 0500 05/13/15 0500 05/14/15 0457  Weight: 123 lb 12.8 oz (56.155 kg) 126 lb 11.2 oz (57.471 kg) 121 lb 4.8 oz (55.021 kg)    BMI:  Body mass index is 18.45 kg/(m^2).  Estimated Nutritional Needs:  Kcal:  1775-2097kcals, BEE: 1345kcals, TEE: (IF 1.1-1.3)(AF 1.2) using IBW of 70kg  Protein:  70-84g protein (1.0-1.2g/kg) using IBW of 70kg  Fluid:  1750-214600mL of fluid (25-5330mL/kg) using IBW of 70kg  Skin:  Reviewed, no issues  EDUCATION NEEDS:  Education needs no appropriate at this time   MODERATE Care Level  Leda QuailAllyson Aliena Ghrist, RD, LDN Pager [phone]

## 2015-05-14 NOTE — Plan of Care (Signed)
Problem: Discharge Progression Outcomes Goal: Other Discharge Outcomes/Goals Outcome: Progressing Plan of care progress to goal for: 1. Pain-no c/o pain this shift 2. Hemodynamically-             -VSS, pt remains afebrile this shift             -chronic foley in place with good urine output             -pt to discharge to SNF 3. Complications-no evidence of this shift 4. Diet-pt has poor po intake 5. Activity-pt is 1 assist to the BR

## 2015-05-14 NOTE — Progress Notes (Signed)
Pt discharged to SNF. Report given to Ranee Gosselinonna Anderson, RN. Pt to leave by EMS.

## 2015-05-14 NOTE — Clinical Social Work Note (Signed)
Pt is ready for discharge today. Pt's son has changed his mind about discharge plan. Pt's son has opted for SNF for STR prior to returning home. CSW offered beds, [facility]. Pt is in agreement. CSW notified facility. [facility]. CSW sent discharge summary via [employer]. Pt and family are in agreement with discharge plan. RN will call report and EMS will provide transportation. CSW is signing off as no further needs identified.   Dede QuerySarah Jihad Brownlow, MSW, LCSW Clinical Social Worker  [phone]

## 2015-05-14 NOTE — Telephone Encounter (Signed)
Transition Care Management Follow-up Telephone Call  How have you been since you were released from the hospital? Spoke to Andres [name], patient's son (DPR signed), patient reports some weakness   Do you understand why you were in the hospital? yes   Do you understand the discharge instrcutions? yes   [facility]   Items Reviewed:  Medications reviewed: yes  Allergies reviewed: n/a  Dietary changes reviewed: n/a  Referrals reviewed: n/a   Functional Questionnaire:   Activities of Daily Living (ADLs):   He states they are independent in the following: continence States they require assistance with the following: ambulation, bathing and hygiene, feeding, grooming, toileting and dressing   Any transportation issues/concerns?: no, provided by family or rehab facility   Any patient concerns? no   Confirmed importance and date/time of follow-up visits scheduled: yes, f/u with Dr. Alphonsus SiasLetvak 7/8  Confirmed with patient if condition begins to worsen call PCP or go to the ER.  Patient was given the office number and encouraged to call back with question or concerns.  : yes

## 2015-05-16 LAB — CULTURE, BLOOD (ROUTINE X 2)
CULTURE: NO GROWTH
Culture: NO GROWTH

## 2015-05-17 ENCOUNTER — Ambulatory Visit: Payer: Medicare Other | Admitting: Internal Medicine

## 2015-05-24 LAB — URINE CULTURE

## 2015-06-03 ENCOUNTER — Telehealth: Payer: Self-pay | Admitting: Internal Medicine

## 2015-06-03 DIAGNOSIS — R2681 Unsteadiness on feet: Secondary | ICD-10-CM | POA: Diagnosis not present

## 2015-06-03 DIAGNOSIS — Z9181 History of falling: Secondary | ICD-10-CM | POA: Diagnosis not present

## 2015-06-03 DIAGNOSIS — F0390 Unspecified dementia without behavioral disturbance: Secondary | ICD-10-CM | POA: Diagnosis not present

## 2015-06-03 DIAGNOSIS — E43 Unspecified severe protein-calorie malnutrition: Secondary | ICD-10-CM | POA: Diagnosis not present

## 2015-06-03 DIAGNOSIS — I5042 Chronic combined systolic (congestive) and diastolic (congestive) heart failure: Secondary | ICD-10-CM | POA: Diagnosis not present

## 2015-06-03 DIAGNOSIS — N401 Enlarged prostate with lower urinary tract symptoms: Secondary | ICD-10-CM | POA: Diagnosis not present

## 2015-06-03 DIAGNOSIS — N183 Chronic kidney disease, stage 3 (moderate): Secondary | ICD-10-CM | POA: Diagnosis not present

## 2015-06-03 DIAGNOSIS — Z7982 Long term (current) use of aspirin: Secondary | ICD-10-CM | POA: Diagnosis not present

## 2015-06-03 DIAGNOSIS — I252 Old myocardial infarction: Secondary | ICD-10-CM | POA: Diagnosis not present

## 2015-06-03 DIAGNOSIS — M6281 Muscle weakness (generalized): Secondary | ICD-10-CM | POA: Diagnosis not present

## 2015-06-03 DIAGNOSIS — Z466 Encounter for fitting and adjustment of urinary device: Secondary | ICD-10-CM | POA: Diagnosis not present

## 2015-06-03 DIAGNOSIS — I2510 Atherosclerotic heart disease of native coronary artery without angina pectoris: Secondary | ICD-10-CM | POA: Diagnosis not present

## 2015-06-03 DIAGNOSIS — M47816 Spondylosis without myelopathy or radiculopathy, lumbar region: Secondary | ICD-10-CM | POA: Diagnosis not present

## 2015-06-03 DIAGNOSIS — I129 Hypertensive chronic kidney disease with stage 1 through stage 4 chronic kidney disease, or unspecified chronic kidney disease: Secondary | ICD-10-CM | POA: Diagnosis not present

## 2015-06-03 DIAGNOSIS — R338 Other retention of urine: Secondary | ICD-10-CM | POA: Diagnosis not present

## 2015-06-03 DIAGNOSIS — F329 Major depressive disorder, single episode, unspecified: Secondary | ICD-10-CM | POA: Diagnosis not present

## 2015-06-03 DIAGNOSIS — Z7902 Long term (current) use of antithrombotics/antiplatelets: Secondary | ICD-10-CM | POA: Diagnosis not present

## 2015-06-03 DIAGNOSIS — E1122 Type 2 diabetes mellitus with diabetic chronic kidney disease: Secondary | ICD-10-CM | POA: Diagnosis not present

## 2015-06-03 NOTE — Telephone Encounter (Signed)
Spoke with Herbert Seta and gave verbal order to ok, she will fax over order to let Dr. Alphonsus Sias know what's going on.

## 2015-06-03 NOTE — Telephone Encounter (Signed)
Herbert Seta called wants to get a verbal order for skilled nursing for foley catheter management

## 2015-06-05 DIAGNOSIS — F0390 Unspecified dementia without behavioral disturbance: Secondary | ICD-10-CM | POA: Diagnosis not present

## 2015-06-05 DIAGNOSIS — R338 Other retention of urine: Secondary | ICD-10-CM | POA: Diagnosis not present

## 2015-06-05 DIAGNOSIS — M6281 Muscle weakness (generalized): Secondary | ICD-10-CM | POA: Diagnosis not present

## 2015-06-05 DIAGNOSIS — E43 Unspecified severe protein-calorie malnutrition: Secondary | ICD-10-CM | POA: Diagnosis not present

## 2015-06-05 DIAGNOSIS — I252 Old myocardial infarction: Secondary | ICD-10-CM | POA: Diagnosis not present

## 2015-06-05 DIAGNOSIS — R2681 Unsteadiness on feet: Secondary | ICD-10-CM | POA: Diagnosis not present

## 2015-06-05 DIAGNOSIS — E1122 Type 2 diabetes mellitus with diabetic chronic kidney disease: Secondary | ICD-10-CM | POA: Diagnosis not present

## 2015-06-05 DIAGNOSIS — Z9181 History of falling: Secondary | ICD-10-CM | POA: Diagnosis not present

## 2015-06-05 DIAGNOSIS — N183 Chronic kidney disease, stage 3 (moderate): Secondary | ICD-10-CM | POA: Diagnosis not present

## 2015-06-05 DIAGNOSIS — I129 Hypertensive chronic kidney disease with stage 1 through stage 4 chronic kidney disease, or unspecified chronic kidney disease: Secondary | ICD-10-CM | POA: Diagnosis not present

## 2015-06-05 DIAGNOSIS — I2510 Atherosclerotic heart disease of native coronary artery without angina pectoris: Secondary | ICD-10-CM | POA: Diagnosis not present

## 2015-06-05 DIAGNOSIS — F329 Major depressive disorder, single episode, unspecified: Secondary | ICD-10-CM | POA: Diagnosis not present

## 2015-06-05 DIAGNOSIS — I5042 Chronic combined systolic (congestive) and diastolic (congestive) heart failure: Secondary | ICD-10-CM | POA: Diagnosis not present

## 2015-06-05 DIAGNOSIS — M47816 Spondylosis without myelopathy or radiculopathy, lumbar region: Secondary | ICD-10-CM | POA: Diagnosis not present

## 2015-06-05 DIAGNOSIS — Z7902 Long term (current) use of antithrombotics/antiplatelets: Secondary | ICD-10-CM | POA: Diagnosis not present

## 2015-06-05 DIAGNOSIS — Z466 Encounter for fitting and adjustment of urinary device: Secondary | ICD-10-CM | POA: Diagnosis not present

## 2015-06-05 DIAGNOSIS — N401 Enlarged prostate with lower urinary tract symptoms: Secondary | ICD-10-CM | POA: Diagnosis not present

## 2015-06-05 DIAGNOSIS — Z7982 Long term (current) use of aspirin: Secondary | ICD-10-CM | POA: Diagnosis not present

## 2015-06-06 DIAGNOSIS — I5042 Chronic combined systolic (congestive) and diastolic (congestive) heart failure: Secondary | ICD-10-CM | POA: Diagnosis not present

## 2015-06-06 DIAGNOSIS — N183 Chronic kidney disease, stage 3 (moderate): Secondary | ICD-10-CM | POA: Diagnosis not present

## 2015-06-06 DIAGNOSIS — N401 Enlarged prostate with lower urinary tract symptoms: Secondary | ICD-10-CM | POA: Diagnosis not present

## 2015-06-06 DIAGNOSIS — I252 Old myocardial infarction: Secondary | ICD-10-CM | POA: Diagnosis not present

## 2015-06-06 DIAGNOSIS — M6281 Muscle weakness (generalized): Secondary | ICD-10-CM | POA: Diagnosis not present

## 2015-06-06 DIAGNOSIS — R2681 Unsteadiness on feet: Secondary | ICD-10-CM | POA: Diagnosis not present

## 2015-06-06 DIAGNOSIS — I129 Hypertensive chronic kidney disease with stage 1 through stage 4 chronic kidney disease, or unspecified chronic kidney disease: Secondary | ICD-10-CM | POA: Diagnosis not present

## 2015-06-06 DIAGNOSIS — Z7902 Long term (current) use of antithrombotics/antiplatelets: Secondary | ICD-10-CM | POA: Diagnosis not present

## 2015-06-06 DIAGNOSIS — E43 Unspecified severe protein-calorie malnutrition: Secondary | ICD-10-CM | POA: Diagnosis not present

## 2015-06-06 DIAGNOSIS — F329 Major depressive disorder, single episode, unspecified: Secondary | ICD-10-CM | POA: Diagnosis not present

## 2015-06-06 DIAGNOSIS — F0390 Unspecified dementia without behavioral disturbance: Secondary | ICD-10-CM | POA: Diagnosis not present

## 2015-06-06 DIAGNOSIS — M47816 Spondylosis without myelopathy or radiculopathy, lumbar region: Secondary | ICD-10-CM | POA: Diagnosis not present

## 2015-06-06 DIAGNOSIS — Z9181 History of falling: Secondary | ICD-10-CM | POA: Diagnosis not present

## 2015-06-06 DIAGNOSIS — Z466 Encounter for fitting and adjustment of urinary device: Secondary | ICD-10-CM | POA: Diagnosis not present

## 2015-06-06 DIAGNOSIS — R338 Other retention of urine: Secondary | ICD-10-CM | POA: Diagnosis not present

## 2015-06-06 DIAGNOSIS — E1122 Type 2 diabetes mellitus with diabetic chronic kidney disease: Secondary | ICD-10-CM | POA: Diagnosis not present

## 2015-06-06 DIAGNOSIS — I2510 Atherosclerotic heart disease of native coronary artery without angina pectoris: Secondary | ICD-10-CM | POA: Diagnosis not present

## 2015-06-06 DIAGNOSIS — Z7982 Long term (current) use of aspirin: Secondary | ICD-10-CM | POA: Diagnosis not present

## 2015-06-07 ENCOUNTER — Telehealth: Payer: Self-pay

## 2015-06-07 DIAGNOSIS — F329 Major depressive disorder, single episode, unspecified: Secondary | ICD-10-CM | POA: Diagnosis not present

## 2015-06-07 DIAGNOSIS — E43 Unspecified severe protein-calorie malnutrition: Secondary | ICD-10-CM | POA: Diagnosis not present

## 2015-06-07 DIAGNOSIS — Z466 Encounter for fitting and adjustment of urinary device: Secondary | ICD-10-CM | POA: Diagnosis not present

## 2015-06-07 DIAGNOSIS — I5042 Chronic combined systolic (congestive) and diastolic (congestive) heart failure: Secondary | ICD-10-CM | POA: Diagnosis not present

## 2015-06-07 DIAGNOSIS — F0390 Unspecified dementia without behavioral disturbance: Secondary | ICD-10-CM | POA: Diagnosis not present

## 2015-06-07 DIAGNOSIS — R2681 Unsteadiness on feet: Secondary | ICD-10-CM | POA: Diagnosis not present

## 2015-06-07 DIAGNOSIS — R338 Other retention of urine: Secondary | ICD-10-CM | POA: Diagnosis not present

## 2015-06-07 DIAGNOSIS — Z9181 History of falling: Secondary | ICD-10-CM | POA: Diagnosis not present

## 2015-06-07 DIAGNOSIS — N183 Chronic kidney disease, stage 3 (moderate): Secondary | ICD-10-CM | POA: Diagnosis not present

## 2015-06-07 DIAGNOSIS — I129 Hypertensive chronic kidney disease with stage 1 through stage 4 chronic kidney disease, or unspecified chronic kidney disease: Secondary | ICD-10-CM | POA: Diagnosis not present

## 2015-06-07 DIAGNOSIS — M47816 Spondylosis without myelopathy or radiculopathy, lumbar region: Secondary | ICD-10-CM | POA: Diagnosis not present

## 2015-06-07 DIAGNOSIS — N401 Enlarged prostate with lower urinary tract symptoms: Secondary | ICD-10-CM | POA: Diagnosis not present

## 2015-06-07 DIAGNOSIS — I2510 Atherosclerotic heart disease of native coronary artery without angina pectoris: Secondary | ICD-10-CM | POA: Diagnosis not present

## 2015-06-07 DIAGNOSIS — M6281 Muscle weakness (generalized): Secondary | ICD-10-CM | POA: Diagnosis not present

## 2015-06-07 DIAGNOSIS — E1122 Type 2 diabetes mellitus with diabetic chronic kidney disease: Secondary | ICD-10-CM | POA: Diagnosis not present

## 2015-06-07 DIAGNOSIS — Z7902 Long term (current) use of antithrombotics/antiplatelets: Secondary | ICD-10-CM | POA: Diagnosis not present

## 2015-06-07 DIAGNOSIS — I252 Old myocardial infarction: Secondary | ICD-10-CM | POA: Diagnosis not present

## 2015-06-07 DIAGNOSIS — Z7982 Long term (current) use of aspirin: Secondary | ICD-10-CM | POA: Diagnosis not present

## 2015-06-07 NOTE — Telephone Encounter (Signed)
Donia Guiles RN with [facility] left v/m; pt was seen on 06/06/15 and [place] request home health orders to assess and treat ; pt has Stage 2 pressure wound on sacrum. Please advise.

## 2015-06-08 NOTE — Telephone Encounter (Signed)
Okay for home health  Check with son and see if I need to put him back on my home visit list

## 2015-06-10 NOTE — Telephone Encounter (Signed)
I would recommend a hydrogel dressing 3 times per week and monitor for signs of secondary infection.  Check with son about whether he will bring him in for follow up or if I should put him back on the home visit list

## 2015-06-10 NOTE — Telephone Encounter (Signed)
Spoke with nurse and advised results   Left message for son on VM asking if he would be bringing in pt for OV or would they need home visit.

## 2015-06-10 NOTE — Telephone Encounter (Signed)
Spoke with nurse and she will be going out once a month to change and check foley cath, also nurse states pt has a stage 2 pressure wound on sacrum, they where treating [facility] with a DuoDerm, per nurse no oozing, the skin around the wound is red and she just wanted to know if Dr. Alphonsus Sias wanted to do anything about it? And if so how many days per week? Please advise

## 2015-06-11 ENCOUNTER — Observation Stay
Admission: EM | Admit: 2015-06-11 | Discharge: 2015-06-14 | Disposition: A | Discharge status: Home / Self Care | Payer: Medicare Other | Attending: Internal Medicine | Admitting: Internal Medicine

## 2015-06-11 ENCOUNTER — Emergency Department: Payer: Medicare Other

## 2015-06-11 DIAGNOSIS — B952 Enterococcus as the cause of diseases classified elsewhere: Secondary | ICD-10-CM | POA: Insufficient documentation

## 2015-06-11 DIAGNOSIS — N401 Enlarged prostate with lower urinary tract symptoms: Secondary | ICD-10-CM | POA: Insufficient documentation

## 2015-06-11 DIAGNOSIS — I130 Hypertensive heart and chronic kidney disease with heart failure and stage 1 through stage 4 chronic kidney disease, or unspecified chronic kidney disease: Secondary | ICD-10-CM | POA: Insufficient documentation

## 2015-06-11 DIAGNOSIS — I252 Old myocardial infarction: Secondary | ICD-10-CM | POA: Diagnosis not present

## 2015-06-11 DIAGNOSIS — L89152 Pressure ulcer of sacral region, stage 2: Secondary | ICD-10-CM | POA: Insufficient documentation

## 2015-06-11 DIAGNOSIS — G9340 Encephalopathy, unspecified: Secondary | ICD-10-CM | POA: Diagnosis present

## 2015-06-11 DIAGNOSIS — N179 Acute kidney failure, unspecified: Secondary | ICD-10-CM | POA: Diagnosis not present

## 2015-06-11 DIAGNOSIS — Z466 Encounter for fitting and adjustment of urinary device: Secondary | ICD-10-CM | POA: Diagnosis not present

## 2015-06-11 DIAGNOSIS — Z833 Family history of diabetes mellitus: Secondary | ICD-10-CM | POA: Insufficient documentation

## 2015-06-11 DIAGNOSIS — Z7951 Long term (current) use of inhaled steroids: Secondary | ICD-10-CM | POA: Diagnosis not present

## 2015-06-11 DIAGNOSIS — L570 Actinic keratosis: Secondary | ICD-10-CM | POA: Diagnosis not present

## 2015-06-11 DIAGNOSIS — I442 Atrioventricular block, complete: Secondary | ICD-10-CM | POA: Diagnosis not present

## 2015-06-11 DIAGNOSIS — B961 Klebsiella pneumoniae [K. pneumoniae] as the cause of diseases classified elsewhere: Secondary | ICD-10-CM | POA: Insufficient documentation

## 2015-06-11 DIAGNOSIS — Z79899 Other long term (current) drug therapy: Secondary | ICD-10-CM | POA: Insufficient documentation

## 2015-06-11 DIAGNOSIS — E46 Unspecified protein-calorie malnutrition: Secondary | ICD-10-CM | POA: Insufficient documentation

## 2015-06-11 DIAGNOSIS — E11649 Type 2 diabetes mellitus with hypoglycemia without coma: Secondary | ICD-10-CM | POA: Insufficient documentation

## 2015-06-11 DIAGNOSIS — I2510 Atherosclerotic heart disease of native coronary artery without angina pectoris: Secondary | ICD-10-CM | POA: Insufficient documentation

## 2015-06-11 DIAGNOSIS — K219 Gastro-esophageal reflux disease without esophagitis: Secondary | ICD-10-CM | POA: Diagnosis not present

## 2015-06-11 DIAGNOSIS — R471 Dysarthria and anarthria: Secondary | ICD-10-CM | POA: Diagnosis not present

## 2015-06-11 DIAGNOSIS — I5033 Acute on chronic diastolic (congestive) heart failure: Secondary | ICD-10-CM | POA: Diagnosis not present

## 2015-06-11 DIAGNOSIS — L219 Seborrheic dermatitis, unspecified: Secondary | ICD-10-CM | POA: Diagnosis not present

## 2015-06-11 DIAGNOSIS — R531 Weakness: Secondary | ICD-10-CM | POA: Diagnosis not present

## 2015-06-11 DIAGNOSIS — Z7902 Long term (current) use of antithrombotics/antiplatelets: Secondary | ICD-10-CM | POA: Diagnosis not present

## 2015-06-11 DIAGNOSIS — Z7982 Long term (current) use of aspirin: Secondary | ICD-10-CM | POA: Insufficient documentation

## 2015-06-11 DIAGNOSIS — E43 Unspecified severe protein-calorie malnutrition: Secondary | ICD-10-CM | POA: Insufficient documentation

## 2015-06-11 DIAGNOSIS — R338 Other retention of urine: Secondary | ICD-10-CM | POA: Diagnosis not present

## 2015-06-11 DIAGNOSIS — L89150 Pressure ulcer of sacral region, unstageable: Secondary | ICD-10-CM | POA: Diagnosis not present

## 2015-06-11 DIAGNOSIS — Z87891 Personal history of nicotine dependence: Secondary | ICD-10-CM | POA: Diagnosis not present

## 2015-06-11 DIAGNOSIS — M1990 Unspecified osteoarthritis, unspecified site: Secondary | ICD-10-CM | POA: Insufficient documentation

## 2015-06-11 DIAGNOSIS — F329 Major depressive disorder, single episode, unspecified: Secondary | ICD-10-CM | POA: Diagnosis not present

## 2015-06-11 DIAGNOSIS — L8990 Pressure ulcer of unspecified site, unspecified stage: Secondary | ICD-10-CM | POA: Insufficient documentation

## 2015-06-11 DIAGNOSIS — R4182 Altered mental status, unspecified: Secondary | ICD-10-CM | POA: Diagnosis not present

## 2015-06-11 DIAGNOSIS — E1122 Type 2 diabetes mellitus with diabetic chronic kidney disease: Secondary | ICD-10-CM | POA: Insufficient documentation

## 2015-06-11 DIAGNOSIS — Z1611 Resistance to penicillins: Secondary | ICD-10-CM | POA: Insufficient documentation

## 2015-06-11 DIAGNOSIS — E875 Hyperkalemia: Secondary | ICD-10-CM | POA: Diagnosis not present

## 2015-06-11 DIAGNOSIS — Y846 Urinary catheterization as the cause of abnormal reaction of the patient, or of later complication, without mention of misadventure at the time of the procedure: Secondary | ICD-10-CM | POA: Diagnosis not present

## 2015-06-11 DIAGNOSIS — G9341 Metabolic encephalopathy: Principal | ICD-10-CM | POA: Insufficient documentation

## 2015-06-11 DIAGNOSIS — N138 Other obstructive and reflux uropathy: Secondary | ICD-10-CM | POA: Insufficient documentation

## 2015-06-11 DIAGNOSIS — N390 Urinary tract infection, site not specified: Secondary | ICD-10-CM

## 2015-06-11 DIAGNOSIS — N183 Chronic kidney disease, stage 3 (moderate): Secondary | ICD-10-CM | POA: Insufficient documentation

## 2015-06-11 DIAGNOSIS — E1129 Type 2 diabetes mellitus with other diabetic kidney complication: Secondary | ICD-10-CM | POA: Insufficient documentation

## 2015-06-11 DIAGNOSIS — R197 Diarrhea, unspecified: Secondary | ICD-10-CM | POA: Diagnosis not present

## 2015-06-11 DIAGNOSIS — E785 Hyperlipidemia, unspecified: Secondary | ICD-10-CM | POA: Insufficient documentation

## 2015-06-11 DIAGNOSIS — M6281 Muscle weakness (generalized): Secondary | ICD-10-CM | POA: Diagnosis not present

## 2015-06-11 DIAGNOSIS — Z9181 History of falling: Secondary | ICD-10-CM | POA: Diagnosis not present

## 2015-06-11 DIAGNOSIS — Z8673 Personal history of transient ischemic attack (TIA), and cerebral infarction without residual deficits: Secondary | ICD-10-CM | POA: Insufficient documentation

## 2015-06-11 DIAGNOSIS — Z681 Body mass index (BMI) 19 or less, adult: Secondary | ICD-10-CM | POA: Diagnosis not present

## 2015-06-11 DIAGNOSIS — Z1639 Resistance to other specified antimicrobial drug: Secondary | ICD-10-CM | POA: Insufficient documentation

## 2015-06-11 DIAGNOSIS — K5790 Diverticulosis of intestine, part unspecified, without perforation or abscess without bleeding: Secondary | ICD-10-CM | POA: Diagnosis not present

## 2015-06-11 DIAGNOSIS — R4781 Slurred speech: Secondary | ICD-10-CM | POA: Diagnosis not present

## 2015-06-11 DIAGNOSIS — I5042 Chronic combined systolic (congestive) and diastolic (congestive) heart failure: Secondary | ICD-10-CM | POA: Diagnosis not present

## 2015-06-11 DIAGNOSIS — F0390 Unspecified dementia without behavioral disturbance: Secondary | ICD-10-CM | POA: Diagnosis not present

## 2015-06-11 DIAGNOSIS — R2681 Unsteadiness on feet: Secondary | ICD-10-CM | POA: Diagnosis not present

## 2015-06-11 DIAGNOSIS — I129 Hypertensive chronic kidney disease with stage 1 through stage 4 chronic kidney disease, or unspecified chronic kidney disease: Secondary | ICD-10-CM | POA: Diagnosis not present

## 2015-06-11 DIAGNOSIS — T8351XA Infection and inflammatory reaction due to indwelling urinary catheter, initial encounter: Secondary | ICD-10-CM | POA: Diagnosis not present

## 2015-06-11 DIAGNOSIS — E441 Mild protein-calorie malnutrition: Secondary | ICD-10-CM | POA: Diagnosis present

## 2015-06-11 LAB — CBC
HCT: 29.3 % — ABNORMAL LOW (ref 40.0–52.0)
Hemoglobin: 9.7 g/dL — ABNORMAL LOW (ref 13.0–18.0)
MCH: 32.2 pg (ref 26.0–34.0)
MCHC: 33.2 g/dL (ref 32.0–36.0)
MCV: 97.2 fL (ref 80.0–100.0)
PLATELETS: 196 10*3/uL (ref 150–440)
RBC: 3.02 MIL/uL — ABNORMAL LOW (ref 4.40–5.90)
RDW: 15.3 % — ABNORMAL HIGH (ref 11.5–14.5)
WBC: 10 10*3/uL (ref 3.8–10.6)

## 2015-06-11 LAB — BASIC METABOLIC PANEL
Anion gap: 11 (ref 5–15)
BUN: 43 mg/dL — ABNORMAL HIGH (ref 6–20)
CALCIUM: 9.8 mg/dL (ref 8.9–10.3)
CO2: 26 mmol/L (ref 22–32)
Chloride: 104 mmol/L (ref 101–111)
Creatinine, Ser: 1.87 mg/dL — ABNORMAL HIGH (ref 0.61–1.24)
GFR calc Af Amer: 36 mL/min — ABNORMAL LOW (ref >60)
GFR, EST NON AFRICAN AMERICAN: 31 mL/min — AB (ref >60)
GLUCOSE: 63 mg/dL — AB (ref 65–99)
Potassium: 5.1 mmol/L (ref 3.5–5.1)
Sodium: 141 mmol/L (ref 135–145)

## 2015-06-11 LAB — URINALYSIS COMPLETE WITH MICROSCOPIC (ARMC ONLY)
BILIRUBIN URINE: NEGATIVE
Glucose, UA: NEGATIVE mg/dL
HGB URINE DIPSTICK: NEGATIVE
NITRITE: NEGATIVE
PH: 5 (ref 5.0–8.0)
PROTEIN: 30 mg/dL — AB
Specific Gravity, Urine: 1.015 (ref 1.005–1.030)

## 2015-06-11 LAB — GLUCOSE, CAPILLARY
Glucose-Capillary: 54 mg/dL — ABNORMAL LOW (ref 65–99)
Glucose-Capillary: 100 mg/dL — ABNORMAL HIGH (ref 65–99)

## 2015-06-11 LAB — TROPONIN I: TROPONIN I: 0.03 ng/mL (ref <0.031)

## 2015-06-11 MED ORDER — INSULIN ASPART 100 UNIT/ML ~~LOC~~ SOLN: 0.0000 [IU] | Freq: Every day | SUBCUTANEOUS | Status: DC

## 2015-06-11 MED ORDER — ATORVASTATIN CALCIUM 10 MG PO TABS
10.0000 mg | ORAL_TABLET | Freq: Every day | ORAL | Status: DC
  Administered 2015-06-12 – 2015-06-14 (×3): 10 mg via ORAL
  Filled 2015-06-11 (×3): qty 1

## 2015-06-11 MED ORDER — HEPARIN SODIUM (PORCINE) 5000 UNIT/ML IJ SOLN
5000.0000 [IU] | Freq: Three times a day (TID) | INTRAMUSCULAR | Status: DC
  Administered 2015-06-12 – 2015-06-14 (×9): 5000 [IU] via SUBCUTANEOUS
  Filled 2015-06-11 (×9): qty 1

## 2015-06-11 MED ORDER — ASPIRIN EC 81 MG PO TBEC
81.0000 mg | DELAYED_RELEASE_TABLET | Freq: Every day | ORAL | Status: DC
  Administered 2015-06-12 – 2015-06-14 (×3): 81 mg via ORAL
  Filled 2015-06-11 (×3): qty 1

## 2015-06-11 MED ORDER — TRIAMCINOLONE ACETONIDE 0.1 % EX CREA
1.0000 "application " | TOPICAL_CREAM | Freq: Two times a day (BID) | CUTANEOUS | Status: DC | PRN
  Filled 2015-06-11: qty 15

## 2015-06-11 MED ORDER — ONDANSETRON HCL 4 MG PO TABS: 4.0000 mg | ORAL_TABLET | Freq: Four times a day (QID) | ORAL | Status: DC | PRN

## 2015-06-11 MED ORDER — OXYCODONE HCL 5 MG PO TABS: 5.0000 mg | ORAL_TABLET | ORAL | Status: DC | PRN

## 2015-06-11 MED ORDER — ONDANSETRON HCL 4 MG/2ML IJ SOLN: 4.0000 mg | Freq: Four times a day (QID) | INTRAMUSCULAR | Status: DC | PRN

## 2015-06-11 MED ORDER — ACETAMINOPHEN 650 MG RE SUPP
650.0000 mg | Freq: Four times a day (QID) | RECTAL | Status: DC | PRN
Start: 2015-06-11 — End: 2015-06-14

## 2015-06-11 MED ORDER — DEXTROSE 5 % IV SOLN
1.0000 g | Freq: Once | INTRAVENOUS | Status: AC
  Administered 2015-06-11: 1 g via INTRAVENOUS
  Filled 2015-06-11: qty 10

## 2015-06-11 MED ORDER — CLOPIDOGREL BISULFATE 75 MG PO TABS
75.0000 mg | ORAL_TABLET | Freq: Every day | ORAL | Status: DC
  Administered 2015-06-12 – 2015-06-14 (×3): 75 mg via ORAL
  Filled 2015-06-11 (×3): qty 1

## 2015-06-11 MED ORDER — DEXTROSE 5 % IV SOLN
1.0000 g | INTRAVENOUS | Status: DC
  Administered 2015-06-12 – 2015-06-13 (×2): 1 g via INTRAVENOUS
  Filled 2015-06-11 (×4): qty 10

## 2015-06-11 MED ORDER — INSULIN ASPART 100 UNIT/ML ~~LOC~~ SOLN
0.0000 [IU] | Freq: Three times a day (TID) | SUBCUTANEOUS | Status: DC
  Administered 2015-06-12: 2 [IU] via SUBCUTANEOUS
  Filled 2015-06-11: qty 2

## 2015-06-11 MED ORDER — DOCUSATE SODIUM 100 MG PO CAPS: 100.0000 mg | ORAL_CAPSULE | Freq: Two times a day (BID) | ORAL | Status: DC | PRN

## 2015-06-11 MED ORDER — ACETAMINOPHEN 325 MG PO TABS: 650.0000 mg | ORAL_TABLET | Freq: Four times a day (QID) | ORAL | Status: DC | PRN

## 2015-06-11 MED ORDER — MORPHINE SULFATE 2 MG/ML IJ SOLN: 2.0000 mg | INTRAMUSCULAR | Status: DC | PRN

## 2015-06-11 MED ORDER — ADULT MULTIVITAMIN W/MINERALS CH
1.0000 | ORAL_TABLET | Freq: Every day | ORAL | Status: DC
  Administered 2015-06-12 – 2015-06-14 (×3): 1 via ORAL
  Filled 2015-06-11 (×3): qty 1

## 2015-06-11 MED ORDER — FINASTERIDE 5 MG PO TABS
5.0000 mg | ORAL_TABLET | Freq: Every day | ORAL | Status: DC
  Administered 2015-06-12 – 2015-06-14 (×3): 5 mg via ORAL
  Filled 2015-06-11 (×3): qty 1

## 2015-06-11 MED ORDER — DEXTROSE 5 % AND 0.45 % NACL IV BOLUS
1000.0000 mL | Freq: Once | INTRAVENOUS | Status: AC
  Administered 2015-06-11: 1000 mL via INTRAVENOUS

## 2015-06-11 MED ORDER — CITALOPRAM HYDROBROMIDE 10 MG PO TABS
10.0000 mg | ORAL_TABLET | Freq: Every day | ORAL | Status: DC
  Administered 2015-06-12 – 2015-06-14 (×3): 10 mg via ORAL
  Filled 2015-06-11 (×3): qty 1

## 2015-06-11 MED ORDER — SODIUM CHLORIDE 0.9 % IV SOLN
INTRAVENOUS | Status: DC
  Administered 2015-06-12 – 2015-06-14 (×4): via INTRAVENOUS

## 2015-06-11 MED ORDER — METOPROLOL TARTRATE 25 MG PO TABS
25.0000 mg | ORAL_TABLET | Freq: Two times a day (BID) | ORAL | Status: DC
  Administered 2015-06-12 – 2015-06-14 (×4): 25 mg via ORAL
  Filled 2015-06-11 (×4): qty 1

## 2015-06-11 NOTE — ED Notes (Signed)
Noticed bed was wet, upon further observation, foley is leaking around port.  Dr. Clint Guy notified.

## 2015-06-11 NOTE — ED Notes (Addendum)
Pt to triage via w/c, smiling, with no distress; shakes "no" when asked if in pain; son reports pt's speech slurred and "had to help him sit up today"; st unsure of time onset; son poor historian, unable to state whether pt has had CVA in past; smile symmetrical, MAE equally with generalized weakness

## 2015-06-11 NOTE — H&P (Signed)
[facility] - [place] at [facility]   PATIENT NAME: Andres [name]    MR#:  [id number]  DATE OF BIRTH:  Jan 11, 1926   DATE OF ADMISSION:  06/11/2015  PRIMARY CARE PHYSICIAN: Tillman Abide, MD   REQUESTING/REFERRING PHYSICIAN: Paduchowski  CHIEF COMPLAINT:   Chief Complaint  Patient presents with  . Altered Mental Status  . Weakness    HISTORY OF PRESENT ILLNESS:  Andres [name]  is a 81 y.o. male with a known history of dementia, type 2 diabetes non-insulin-requiring uncomplicated, stroke without residual deficit presenting with altered mental status. The patient is unable to provide meaningful information given mental status medical condition history obtained from emergency department staff as well as son. Son states L mental status as confusion with slurred speech. Check blood glucose at home noted to be hypoglycemic glucose in the 50s came To Hospital Further Workup and Evaluation.  PAST MEDICAL HISTORY:   Past Medical History  Diagnosis Date  . CAD (coronary artery disease)   . Diverticulitis   . GERD (gastroesophageal reflux disease)   . Hyperlipidemia   . Hypertension   . BPH (benign prostatic hypertrophy)   . Diabetes mellitus   . DJD (degenerative joint disease)     lumbar spine  . IBS (irritable bowel syndrome)   . Stroke   . Depression 2012  . Acute MI 09/2012  . Chronic kidney disease     ACUTE RENAL FAILURE  . Foley catheter in place 05/09/15    for 1 year per family    PAST SURGICAL HISTORY:   Past Surgical History  Procedure Laterality Date  . Angioplasty  1995  . Doppler echocardiography  11/1999    syncope  . Pacemaker insertion      Pacer/cath  (ef 65%)  1/01 -  guidant Discovery II DR pulse generator  . Prostate surgery  07/2003  . Esophagogastroduodenoscopy  07/2005    neg  . Cardiac catheterization  09/2012    NSTEMI/STENT PLACED  . Insert / replace / remove pacemaker      [id number]    SOCIAL HISTORY:    History  Substance Use Topics  . Smoking status: Former Games developer  . Smokeless tobacco: Never Used  . Alcohol Use: No     Comment: heavy in the past    FAMILY HISTORY:   Family History  Problem Relation Age of Onset  . Diabetes Sister     DRUG ALLERGIES:  No Known Allergies  REVIEW OF SYSTEMS:  Unable to obtain given patient's mental status/medical condition   MEDICATIONS AT HOME:   Prior to Admission medications   Medication Sig Start Date End Date Taking? Authorizing Provider  aspirin EC 81 MG tablet Take 81 mg by mouth daily.   Yes Historical Provider, MD  atorvastatin (LIPITOR) 10 MG tablet Take 10 mg by mouth daily.    Yes Historical Provider, MD  citalopram (CELEXA) 10 MG tablet Take 10 mg by mouth daily.   Yes Historical Provider, MD  clopidogrel (PLAVIX) 75 MG tablet Take 75 mg by mouth daily.   Yes Historical Provider, MD  docusate sodium (COLACE) 100 MG capsule Take 100 mg by mouth 2 (two) times daily as needed for mild constipation.    Yes Historical Provider, MD  finasteride (PROSCAR) 5 MG tablet Take 5 mg by mouth daily.   Yes Historical Provider, MD  fluticasone (FLONASE) 50 MCG/ACT nasal spray Place 2 sprays into the nose daily. Patient taking differently: Place 2 sprays into  the nose daily as needed for rhinitis.  05/10/13  Yes Karie Schwalbe, MD  glipiZIDE (GLUCOTROL XL) 5 MG 24 hr tablet Take 5 mg by mouth daily.   Yes Historical Provider, MD  metFORMIN (GLUCOPHAGE) 1000 MG tablet Take 500 mg by mouth 2 (two) times daily.   Yes Historical Provider, MD  metoprolol tartrate (LOPRESSOR) 25 MG tablet Take 0.5 tablets (12.5 mg total) by mouth 2 (two) times daily. Patient taking differently: Take 25 mg by mouth 2 (two) times daily.  05/14/15  Yes Srikar Sudini, MD  Multiple Vitamin (MULTIVITAMIN WITH MINERALS) TABS tablet Take 1 tablet by mouth daily. 05/10/15  Yes Srikar Sudini, MD  nitroGLYCERIN (NITROSTAT) 0.4 MG SL tablet Place 0.4 mg under the tongue every 5  (five) minutes as needed for chest pain.    Yes Historical Provider, MD  triamcinolone cream (KENALOG) 0.1 % Apply 1 application topically 2 (two) times daily as needed. For itchy areas Patient taking differently: Apply 1 application topically 2 (two) times daily as needed (for dry skin).  10/26/13  Yes Karie Schwalbe, MD  albuterol (PROVENTIL) (2.5 MG/3ML) 0.083% nebulizer solution Take 3 mLs (2.5 mg total) by nebulization every 2 (two) hours as needed for wheezing. Patient not taking: Reported on 06/11/2015 05/10/15   Milagros Loll, MD      VITAL SIGNS:  Blood pressure 114/52, pulse 59, temperature 97.9 F (36.6 C), temperature source Oral, resp. rate 21, SpO2 96 %.  PHYSICAL EXAMINATION:  VITAL SIGNS: Filed Vitals:   06/11/15 2200  BP: 114/52  Pulse: 59  Temp:   Resp: 21   GENERAL:81 y.o.male currently in moderate acute distress. Given mental status HEAD: Normocephalic, atraumatic.  EYES: Pupils equal, round, reactive to light. Extraocular muscles intact. No scleral icterus.  MOUTH: Dry mucosal membrane. Dentition intact. No abscess noted.  EAR, NOSE, THROAT: Clear without exudates. No external lesions.  NECK: Supple. No thyromegaly. No nodules. No JVD.  PULMONARY: Clear to ascultation, without wheeze rails or rhonci. No use of accessory muscles, Good respiratory effort. good air entry bilaterally CHEST: Nontender to palpation.  CARDIOVASCULAR: S1 and S2. Regular rate and rhythm. No murmurs, rubs, or gallops. No edema. Pedal pulses 2+ bilaterally.  GASTROINTESTINAL: Soft, nontender, nondistended. No masses. Positive bowel sounds. No hepatosplenomegaly.  MUSCULOSKELETAL: No swelling, clubbing, or edema. Range of motion full in all extremities.  NEUROLOGIC: Cranial nerves II through XII are intact. No gross focal neurological deficits. Sensation intact. Reflexes intact.  SKIN: No ulceration, lesions, rashes, or cyanosis. Skin warm and dry. Turgor intact.  PSYCHIATRIC: Mood, affect  flattened. The patient is awake, alert and oriented x self. Insight, judgment poor.    LABORATORY PANEL:   CBC  Recent Labs Lab 06/11/15 2031  WBC 10.0  HGB 9.7*  HCT 29.3*  PLT 196   ------------------------------------------------------------------------------------------------------------------  Chemistries   Recent Labs Lab 06/11/15 2031  NA 141  K 5.1  CL 104  CO2 26  GLUCOSE 63*  BUN 43*  CREATININE 1.87*  CALCIUM 9.8   ------------------------------------------------------------------------------------------------------------------  Cardiac Enzymes  Recent Labs Lab 06/11/15 2023  TROPONINI 0.03   ------------------------------------------------------------------------------------------------------------------  RADIOLOGY:  Ct Head Wo Contrast  06/11/2015   CLINICAL DATA:  Altered mental status.  Generalized weakness.  EXAM: CT HEAD WITHOUT CONTRAST  TECHNIQUE: Contiguous axial images were obtained from the base of the skull through the vertex without intravenous contrast.  COMPARISON:  03/02/2013  FINDINGS: There is no intracranial hemorrhage, mass or evidence of acute infarction. There is no  extra-axial fluid collection. There is moderately severe cerebral and cerebellar atrophy. There is white matter hypodensity consistent with chronic microvascular disease. There are chronic lacunar infarctions involving the left caudate, left anterior lentiform nuclei, right subinsular region. No interval change is evident.  There is chronic opacification of the right maxillary sinus with associated bony sclerosis. No bony destruction is evident. This is also unchanged.  IMPRESSION: Moderately severe generalized atrophy and chronic microvascular disease. Chronic lacunar infarctions in the basal ganglia. No acute intracranial findings.  Chronic right maxillary sinus opacification.   Electronically Signed   By: Ellery Plunk M.D.   On: 06/11/2015 21:11    EKG:   Orders  placed or performed during the hospital encounter of 06/11/15  . ED EKG  . ED EKG    IMPRESSION AND PLAN:   81 year old Caucasian gentleman history of type 2 diabetes uncomplicated, history of CVA without deficit presenting with altered mental status.  1. Encephalopathy, metabolic: Combination of hypoglycemia as well as UTI: Glucose has improved, follow mental status 2. Urinary tract infection, site unspecified: Complicated by indwelling Foley catheter present for approximately 2 weeks, ceftriaxone, follow culture data 3. Type 2 diabetes, hypoglycemic: Hold oral agents follow Accu-Cheks 4. Hyperlipidemia unspecified: Statin therapy 5. Venous thromboembolism prophylactic: Heparin subcutaneous    All the records are reviewed and case discussed with ED provider. Management plans discussed with the patient, family and they are in agreement.  CODE STATUS: Full  TOTAL TIME TAKING CARE OF THIS PATIENT: 35 minutes.    Andres [name],  Mardi Mainland.D on 06/11/2015 at 10:42 PM  Between 7am to 6pm - Pager - [phone]  After 6pm: House Pager: - [phone]  [facility]  Office  [phone]  CC: Primary care physician; Tillman Abide, MD

## 2015-06-11 NOTE — ED Notes (Signed)
FSBS 54; pt given 8oz OJ to drink and charge nurse notified

## 2015-06-11 NOTE — ED Provider Notes (Signed)
[facility] Emergency Department Provider Note  Time seen: 8:59 PM  I have reviewed the triage vital signs and the nursing notes.   HISTORY  Chief Complaint No chief complaint on file.    HPI Andres [name] is a 81 y.o. male with past medical history of coronary artery disease, gastric reflux, hyperlipidemia, hypertension, diabetes, CVA, MI, CK D who presents to the emergency department with slurred speech and altered mental status. According to the son patient did not eat lunch or dinner tonight. Had very slurred speech and seemed to have slower responses, so they brought him to the emergency department for further evaluation. Son states at baseline the patient is able to mostly care for himself, walks by himself with a walker, feeds himself, etc. They do have home health from 12 PM to 5 PM every day to help with daily activities of life. Patient denies any pain, denies headache. He states he feels like his legs are weaker but cannot identify a specific side. The son states earlier today the patient was saying the left side of his face felt numb. Upon arrival to the emergency department the patient's finger stick blood glucose was noted to be 54, and the patient was given orange juice. The son states minimal improvement after the orange juice, the patient remains with slurred speech. No clear onset of symptoms.     Past Medical History  Diagnosis Date  . CAD (coronary artery disease)   . Diverticulitis   . GERD (gastroesophageal reflux disease)   . Hyperlipidemia   . Hypertension   . BPH (benign prostatic hypertrophy)   . Diabetes mellitus   . DJD (degenerative joint disease)     lumbar spine  . IBS (irritable bowel syndrome)   . Stroke   . Depression 2012  . Acute MI 09/2012  . Chronic kidney disease     ACUTE RENAL FAILURE  . Foley catheter in place 05/09/15    for 1 year per family    Patient Active Problem List   Diagnosis Date Noted  . Acute on  chronic diastolic heart failure 05/14/2015  . Dementia 05/14/2015  . UTI (urinary tract infection) due to urinary indwelling catheter 05/10/2015  . Hyperkalemia 05/10/2015  . ARF (acute renal failure) 05/09/2015  . Protein-calorie malnutrition, severe 05/09/2015  . Preventative health care 12/17/2014  . Advance directive discussed with patient 12/17/2014  . Chronic retention of urine 04/30/2014  . Chronic kidney disease, stage III (moderate) 10/26/2013  . Pacemaker-[employer] 06/20/2012  . Episodic mood disorder   . CVA 12/01/2010  . SEBORRHEIC DERMATITIS 04/24/2010  . DEGENERATIVE JOINT DISEASE, LUMBAR SPINE 04/18/2009  . AV BLOCK, COMPLETE 04/03/2009  . Late effects of CVA (cerebrovascular accident) 01/14/2009  . ACTINIC KERATOSIS 06/01/2008  . IRRITABLE BOWEL SYNDROME 05/19/2007  . Type 2 diabetes mellitus with renal manifestations, controlled 04/04/2007  . HYPERLIPIDEMIA 04/01/2007  . HYPERTENSION 04/01/2007  . Coronary atherosclerosis of native coronary artery 04/01/2007  . GERD 04/01/2007  . DIVERTICULOSIS, COLON 04/01/2007  . BPH with urinary obstruction 04/01/2007    Past Surgical History  Procedure Laterality Date  . Angioplasty  1995  . Doppler echocardiography  11/1999    syncope  . Pacemaker insertion      Pacer/cath  (ef 65%)  1/01 -  guidant Discovery II DR pulse generator  . Prostate surgery  07/2003  . Esophagogastroduodenoscopy  07/2005    neg  . Cardiac catheterization  09/2012    NSTEMI/STENT PLACED  .  Insert / replace / remove pacemaker      [id number]    Current Outpatient Rx  Name  Route  Sig  Dispense  Refill  . albuterol (PROVENTIL) (2.5 MG/3ML) 0.083% nebulizer solution   Nebulization   Take 3 mLs (2.5 mg total) by nebulization every 2 (two) hours as needed for wheezing.   75 mL   12   . aspirin 81 MG tablet   Oral   Take 81 mg by mouth daily.           Marland Kitchen atorvastatin (LIPITOR) 10 MG tablet      Take 1 tablet by mouth  daily   90  tablet   3   . citalopram (CELEXA) 10 MG tablet      Take 1 tablet by mouth  daily   90 tablet   1   . clopidogrel (PLAVIX) 75 MG tablet      Take 1 tablet by mouth  daily   90 tablet   3   . docusate sodium (COLACE) 100 MG capsule   Oral   Take 100 mg by mouth 2 (two) times daily.         . feeding supplement (BOOST HIGH PROTEIN) LIQD   Oral   Take 1 Container by mouth daily.         . finasteride (PROSCAR) 5 MG tablet      Take 1 tablet by mouth  daily   90 tablet   3   . fluticasone (FLONASE) 50 MCG/ACT nasal spray   Nasal   Place 2 sprays into the nose daily.   16 g   1   . GLIPIZIDE XL 5 MG 24 hr tablet      Take 1 tablet (5 mg total)  by mouth daily.   90 tablet   3   . metFORMIN (GLUCOPHAGE) 1000 MG tablet      Take one-half tablet by  mouth two times daily with  meals   90 tablet   3   . metoprolol tartrate (LOPRESSOR) 25 MG tablet   Oral   Take 0.5 tablets (12.5 mg total) by mouth 2 (two) times daily.   180 tablet   1   . Multiple Vitamin (MULTIVITAMIN WITH MINERALS) TABS tablet   Oral   Take 1 tablet by mouth daily.         . nitroGLYCERIN (NITROSTAT) 0.4 MG SL tablet   Sublingual   Place 0.4 mg under the tongue every 5 (five) minutes as needed.         . triamcinolone cream (KENALOG) 0.1 %   Topical   Apply 1 application topically 2 (two) times daily as needed. For itchy areas   30 g   2     Allergies Review of patient's allergies indicates no known allergies.  Family History  Problem Relation Age of Onset  . Diabetes Sister     Social History History  Substance Use Topics  . Smoking status: Former Games developer  . Smokeless tobacco: Never Used  . Alcohol Use: No     Comment: heavy in the past    Review of Systems Constitutional: Negative for fever. Cardiovascular: Negative for chest pain. Respiratory: Negative for shortness of breath. Gastrointestinal: Negative for abdominal pain Neurological: Negative for .  States bilateral lower extremity weakness. 10-point ROS otherwise negative.  ____________________________________________   PHYSICAL EXAM:  VITAL SIGNS: ED Triage Vitals  Enc Vitals Group     BP 06/11/15 2007 136/57 mmHg  Pulse Rate 06/11/15 2007 64     Resp 06/11/15 2007 18     Temp 06/11/15 2007 97.9 F (36.6 C)     Temp Source 06/11/15 2007 Oral     SpO2 06/11/15 2007 97 %     Weight --      Height --      Head Cir --      Peak Flow --      Pain Score --      Pain Loc --      Pain Edu? --      Excl. in GC? --     Constitutional: Alert and oriented. Slow responses, but oriented. Appeared to be accurate responses. Eyes: Strabismus, baseline. ENT   Head: Normocephalic and atraumatic. Cardiovascular: Normal rate, regular rhythm. No murmurs, rubs, or gallops. Respiratory: Normal respiratory effort without tachypnea nor retractions. Breath sounds are clear  Gastrointestinal: Soft and nontender. No distention.  Musculoskeletal: Nontender with normal range of motion in all extremities.  Neurologic:  Patient does have slurred speech. Slowed speech. Able to move all extremities. Good strength in all extremities 5/5. Patient states equal sensation in all extremities. No appreciable facial droop. Patient does have strabismus, but son states this is baseline. Skin:  Skin is warm, dry and intact.  Psychiatric: Mood and affect are normal. Speech and behavior are normal.   ____________________________________________    EKG  EKG reviewed and interpreted by myself shows electronic ventricular paced rhythm at 64 bpm, widened QRS, no concerning changes noted  ____________________________________________    RADIOLOGY  CT scan of the head is negative for any acute abnormality. Positive for chronic changes.  ____________________________________________   INITIAL IMPRESSION / ASSESSMENT AND PLAN / ED COURSE  Pertinent labs & imaging results that were available during my  care of the patient were reviewed by me and considered in my medical decision making (see chart for details).  Given the patient's slurred speech which the son states is new we'll proceed with a stroke workup including CT head. Patient denies any headache, focal weakness or numbness besides feeling general weakness especially in the lower extremities. We will check labs, including urinalysis. No appreciable deficits on exam besides slurred speech. Did not appear to improve following orange juice administration.  CT shows no acute changes. Labs are significant for a significant urinary tract infection, we will take blood cultures, urine culture, and start the patient on IV Rocephin and admitted to the hospital for further treatment and evaluation. ____________________________________________   FINAL CLINICAL IMPRESSION(S) / ED DIAGNOSES  Altered mental status Urinary tract infection   Minna Antis, MD 06/11/15 2153

## 2015-06-11 NOTE — ED Notes (Signed)
Pt's son states foley catheter has been in place for about 2 weeks.  He has a foley for chronic urinary retention and has used a foley for 2 years.  It is changed monthly by [facility] RN

## 2015-06-12 ENCOUNTER — Telehealth: Payer: Self-pay

## 2015-06-12 DIAGNOSIS — E11649 Type 2 diabetes mellitus with hypoglycemia without coma: Secondary | ICD-10-CM | POA: Diagnosis not present

## 2015-06-12 DIAGNOSIS — E785 Hyperlipidemia, unspecified: Secondary | ICD-10-CM | POA: Diagnosis not present

## 2015-06-12 DIAGNOSIS — E119 Type 2 diabetes mellitus without complications: Secondary | ICD-10-CM | POA: Diagnosis not present

## 2015-06-12 DIAGNOSIS — E441 Mild protein-calorie malnutrition: Secondary | ICD-10-CM | POA: Diagnosis present

## 2015-06-12 DIAGNOSIS — L8990 Pressure ulcer of unspecified site, unspecified stage: Secondary | ICD-10-CM | POA: Insufficient documentation

## 2015-06-12 DIAGNOSIS — N390 Urinary tract infection, site not specified: Secondary | ICD-10-CM | POA: Diagnosis not present

## 2015-06-12 DIAGNOSIS — G9340 Encephalopathy, unspecified: Secondary | ICD-10-CM | POA: Diagnosis not present

## 2015-06-12 LAB — GLUCOSE, CAPILLARY
GLUCOSE-CAPILLARY: 103 mg/dL — AB (ref 65–99)
GLUCOSE-CAPILLARY: 118 mg/dL — AB (ref 65–99)
Glucose-Capillary: 62 mg/dL — ABNORMAL LOW (ref 65–99)
Glucose-Capillary: 121 mg/dL — ABNORMAL HIGH (ref 65–99)
Glucose-Capillary: 111 mg/dL — ABNORMAL HIGH (ref 65–99)
Glucose-Capillary: 65 mg/dL (ref 65–99)

## 2015-06-12 LAB — C DIFFICILE QUICK SCREEN W PCR REFLEX
C DIFFICILE (CDIFF) INTERP: NEGATIVE
C Diff antigen: NEGATIVE
C Diff toxin: NEGATIVE

## 2015-06-12 MED ORDER — LOPERAMIDE HCL 1 MG/5ML PO LIQD: 2.0000 mg | ORAL | Status: DC | PRN

## 2015-06-12 MED ORDER — LOPERAMIDE HCL 1 MG/5ML PO LIQD
2.0000 mg | Freq: Once | ORAL | Status: AC
  Administered 2015-06-12: 16:00:00 2 mg via ORAL
  Filled 2015-06-12 (×2): qty 10

## 2015-06-12 MED ORDER — ENSURE ENLIVE PO LIQD
237.0000 mL | Freq: Three times a day (TID) | ORAL | Status: DC
  Administered 2015-06-12 – 2015-06-14 (×6): 237 mL via ORAL

## 2015-06-12 NOTE — Plan of Care (Signed)
Problem: Discharge Progression Outcomes Goal: Other Discharge Outcomes/Goals Outcome: Progressing Patient had no c/o pain this shift VSS, foley in place and draining  Patient had several loose stool was checked and was negative for c-diff Patient is tolerating diet well Patient son stopped by near shift end and and said they the slurred speech patient had on admission is new and would call rounding MD in morning to discuss patients care plan

## 2015-06-12 NOTE — Plan of Care (Signed)
Problem: Discharge Progression Outcomes Goal: Discharge plan in place and appropriate Individualism Outcome: Progressing Pt prefers to be called Andres [name]. Lives at home with son. Has home health RN everyday to assist with bath, grooming, adls HOH, wears hearing aid Left ear.    Goal: Other Discharge Outcomes/Goals Outcome: Progressing Plan of care progress to goal: Pain - pt complains of no pain this shift Hemodynamically stable - VSS Complications - pt has slurred speech, says it r/t prior stroke Activity - pt uses walker at home, put on falls here r/t AMS upon arrival in ED

## 2015-06-12 NOTE — Telephone Encounter (Signed)
Dr. Alphonsus Sias just spoke with son

## 2015-06-12 NOTE — Care Management (Signed)
Admitted to [facility] with the diagnosis of encephalopathy. Lives with son, Delores, [phone]). Sees Dr. Alphonsus Sias. Discharged from [facility] to [facility] on 05/14/15. Now is being followed by [facility] in the home. Nurse, physical therapy, and aide. Uses a rolling walker in the home. IV Rocephin continues. Foley intact. Will need physical therapy evaluation. Gwenette Greet RN MSN Care Management [phone]

## 2015-06-12 NOTE — Progress Notes (Signed)
[facility] Physicians - [place] at [facility]   PATIENT NAME: Andres [name]    MR#:  [id number]  DATE OF BIRTH:  1926-04-09  SUBJECTIVE:  CHIEF COMPLAINT:   Patient is resting comfortably, reporting diarrhea, requesting Imodium , more alert today  REVIEW OF SYSTEMS:  CONSTITUTIONAL: No fever, fatigue or weakness.  EYES: No blurred or double vision.  EARS, NOSE, AND THROAT: No tinnitus or ear pain.  RESPIRATORY: No cough, shortness of breath, wheezing or hemoptysis.  CARDIOVASCULAR: No chest pain, orthopnea, edema.  GASTROINTESTINAL: No nausea, vomiting, reporting diarrhea, no abdominal pain.  GENITOURINARY: No dysuria, hematuria.  ENDOCRINE: No polyuria, nocturia,  HEMATOLOGY: No anemia, easy bruising or bleeding SKIN: No rash or lesion. MUSCULOSKELETAL: No joint pain or arthritis.   NEUROLOGIC: No tingling, numbness. Mumbling speech probably his baseline  PSYCHIATRY: No anxiety or depression.   DRUG ALLERGIES:  No Known Allergies  VITALS:  Blood pressure 118/55, pulse 60, temperature 97.8 F (36.6 C), temperature source Oral, resp. rate 18, height  (1.727 m), weight 53.207 kg (117 lb 4.8 oz), SpO2 100 %.  PHYSICAL EXAMINATION:  GENERAL:  81 y.o.-year-old patient lying in the bed with no acute distress.  EYES: Pupils equal, round, reactive to light and accommodation. No scleral icterus. HEENT: Head atraumatic, normocephalic. Oropharynx and nasopharynx clear.  NECK:  Supple, no jugular venous distention. No thyroid enlargement, no tenderness.  LUNGS: Normal breath sounds bilaterally, no wheezing, rales,rhonchi or crepitation. No use of accessory muscles of respiration.  CARDIOVASCULAR: S1, S2 normal. No murmurs, rubs, or gallops.  ABDOMEN: Soft, nontender, nondistended. Bowel sounds present. No organomegaly or mass.  EXTREMITIES: No pedal edema, cyanosis, or clubbing.  NEUROLOGIC: Cranial nerves II through XII are intact. Garbled speech. Sensation intact.  Gait not checked.  PSYCHIATRIC: The patient is alert and oriented x 3.  SKIN: No obvious rash, lesion. Sacral decub with his ulcers stage II   LABORATORY PANEL:   CBC  Recent Labs Lab 06/11/15 2031  WBC 10.0  HGB 9.7*  HCT 29.3*  PLT 196   ------------------------------------------------------------------------------------------------------------------  Chemistries   Recent Labs Lab 06/11/15 2031  NA 141  K 5.1  CL 104  CO2 26  GLUCOSE 63*  BUN 43*  CREATININE 1.87*  CALCIUM 9.8   ------------------------------------------------------------------------------------------------------------------  Cardiac Enzymes  Recent Labs Lab 06/11/15 2023  TROPONINI 0.03   ------------------------------------------------------------------------------------------------------------------  RADIOLOGY:  Ct Head Wo Contrast  06/11/2015   CLINICAL DATA:  Altered mental status.  Generalized weakness.  EXAM: CT HEAD WITHOUT CONTRAST  TECHNIQUE: Contiguous axial images were obtained from the base of the skull through the vertex without intravenous contrast.  COMPARISON:  03/02/2013  FINDINGS: There is no intracranial hemorrhage, mass or evidence of acute infarction. There is no extra-axial fluid collection. There is moderately severe cerebral and cerebellar atrophy. There is white matter hypodensity consistent with chronic microvascular disease. There are chronic lacunar infarctions involving the left caudate, left anterior lentiform nuclei, right subinsular region. No interval change is evident.  There is chronic opacification of the right maxillary sinus with associated bony sclerosis. No bony destruction is evident. This is also unchanged.  IMPRESSION: Moderately severe generalized atrophy and chronic microvascular disease. Chronic lacunar infarctions in the basal ganglia. No acute intracranial findings.  Chronic right maxillary sinus opacification.   Electronically Signed   By: Ellery Plunk M.D.   On: 06/11/2015 21:11    EKG:   Orders placed or performed during the hospital encounter of 06/11/15  . ED  EKG  . ED EKG  . EKG 12-Lead  . EKG 12-Lead    ASSESSMENT AND PLAN:   81 year old Caucasian gentleman history of type 2 diabetes uncomplicated, history of CVA without deficit presenting with altered mental status.  1. Encephalopathy, metabolic: Combination of hypoglycemia as well as UTI:  Glucose has improved, mental status is relatively better, monitor closely  2. Urinary tract infection, site unspecified:  Complicated by indwelling Foley catheter present for approximately 2 weeks, ceftriaxone, follow culture data  3. Diarrhea C. difficile toxin is negative. Will provide Imodium Check BMP in a.m.   4. Type 2 diabetes, hypoglycemic: Hold oral agents follow Accu-Cheks  5. Hyperlipidemia unspecified: Statin therapy  6. Pressure ulcer on coccyx stage II Reposition patient every 2 hours  7 moderate malnutrition Provide supplements as recommended by dietitian.  8. Venous thromboembolism prophylactic: Heparin subcutaneous     All the records are reviewed and case discussed with Care Management/Social Workerr. Management plans discussed with the patient, family and they are in agreement.  CODE STATUS: Full code  TOTAL TIME TAKING CARE OF THIS PATIENT:  reviewing medical records, follow-up, progress note and coronation of care-35 minutes.   POSSIBLE D/C IN 3 DAYS, DEPENDING ON CLINICAL CONDITION.   Ramonita Lab M.D on 06/12/2015 at 1:42 PM  Between 7am to 6pm - Pager - [phone] After 6pm go to [email or web address] - password [email or web address] [facility]  [place] [facility]  Office  [phone]  CC: Primary care physician; Tillman Abide, MD

## 2015-06-12 NOTE — Progress Notes (Signed)
Initial Nutrition Assessment  DOCUMENTATION CODES:   Non-severe (moderate) malnutrition in context of chronic illness  INTERVENTION:   Meals and Snacks: Cater to patient preferences, Recommend Mechanical Soft (dysphgia III diet order for ease of chewing and swallowing) Coordination of Care: if pt at risk for aspiration, recommend SLP evaluation (RD notes SLP following on last admission (May 09, 2015), pt on dysphagia I as pt having difficulty chewing with sores from dentures at that time) Medical Food Supplement Therapy: Recommend Ensure Enlive po BID, each supplement provides 350 kcal and 20 grams of protein   NUTRITION DIAGNOSIS:   Inadequate oral intake related to acute illness as evidenced by meal completion < 25%.  GOAL:   Patient will meet greater than or equal to 90% of their needs  MONITOR:    (Energy Intake, Anthropometrics, Digestive System, Electrolyte and renal Profile, skin)  REASON FOR ASSESSMENT:   Malnutrition Screening Tool    ASSESSMENT:   Pt admitted with encephalopathy, hypoglycemia and UTI.  Past Medical History  Diagnosis Date  . CAD (coronary artery disease)   . Diverticulitis   . GERD (gastroesophageal reflux disease)   . Hyperlipidemia   . Hypertension   . BPH (benign prostatic hypertrophy)   . Diabetes mellitus   . DJD (degenerative joint disease)     lumbar spine  . IBS (irritable bowel syndrome)   . Stroke   . Depression 2012  . Acute MI 09/2012  . Chronic kidney disease     ACUTE RENAL FAILURE  . Foley catheter in place 05/09/15    for 1 year per family     Diet Order:  DIET DYS 3 Room service appropriate?: Yes; Fluid consistency:: Thin    Current Nutrition: Pt reports eating a very large breakfast this am. Breakfast tray in pt room, pt had eaten fruit only, no french toast or eggs, with 50% of coffee.  Food/Nutrition-Related History: Unable to assess as pt somewhat confused. On last admission, May 09, 2015, pt had sores on  gums from dentures and pt reported at that time, eating hardly anything for weeks. Pt drinking Ensure on last admission.    Medications: NS at 56mL/hr, Novolog  Electrolyte/Renal Profile and Glucose Profile:   Recent Labs Lab 06/11/15 2031  NA 141  K 5.1  CL 104  CO2 26  BUN 43*  CREATININE 1.87*  CALCIUM 9.8  GLUCOSE 63*   Protein Profile: No results for input(s): ALBUMIN in the last 168 hours.  Gastrointestinal Profile: Last BM: multiple loose stools this am   Nutrition-Focused Physical Exam Findings: Nutrition-Focused physical exam completed. Findings are moderate fat depletion, severe muscle depletion, and no edema.     Weight Change: Per [facility] pt weight 121lbs 05/14/2015 (4% loss in one month), 130lbs in February (6% weight loss in 10 months)   Skin:   (Stage II pressure ulcer, coccyx)  Height:   Ht Readings from Last 1 Encounters:  06/11/15 5\' 8"  (1.727 m)    Weight:   Wt Readings from Last 1 Encounters:  06/11/15 117 lb 4.8 oz (53.207 kg)    Wt Readings from Last 10 Encounters:  06/11/15 117 lb 4.8 oz (53.207 kg)  05/14/15 121 lb 4.8 oz (55.021 kg)  12/17/14 130 lb (58.968 kg)  12/11/14 130 lb (58.968 kg)  07/27/14 130 lb (58.968 kg)  04/30/14 127 lb (57.607 kg)  12/06/13 121 lb (54.885 kg)  10/26/13 125 lb 8 oz (56.926 kg)  09/29/13 123 lb 12 oz (56.133 kg)  07/14/13  127 lb (57.607 kg)     Ideal Body Weight:   70kg  BMI:  Body mass index is 17.84 kg/(m^2).  Estimated Nutritional Needs:   Kcal:  1775-2097kcals, BEE: 1345kcals, TEE: (IF 1.1-1.3)(AF 1.2) using IBW of 70kg  Protein:  70-84g protein (1.0-1.2g/kg) using IBW of 70kg  Fluid:  1750-2177mL of fluid (25-12mL/kg) using IBW of 70kg  EDUCATION NEEDS:   No education needs identified at this time   HIGH Care Level  Leda Quail, RD, LDN Pager [phone]

## 2015-06-12 NOTE — Progress Notes (Signed)
Paged Dr. Anne Hahn about holding pm metoprolol.  Pts pulse 59. BP 104/52.  Per Dr. Anne Hahn, hold pm Metoprolol  tab.

## 2015-06-12 NOTE — Telephone Encounter (Signed)
[name] pts son left v/m; pt was admitted to [facility] rm 109 on 06/11/15 with severe UTI. [name] also wants to discuss pts day care with [facility]; calendar year ends in Sept 2016. Left v/m for [name] to cb.

## 2015-06-13 DIAGNOSIS — N390 Urinary tract infection, site not specified: Secondary | ICD-10-CM | POA: Diagnosis not present

## 2015-06-13 DIAGNOSIS — E11649 Type 2 diabetes mellitus with hypoglycemia without coma: Secondary | ICD-10-CM | POA: Diagnosis not present

## 2015-06-13 DIAGNOSIS — R404 Transient alteration of awareness: Secondary | ICD-10-CM | POA: Diagnosis not present

## 2015-06-13 DIAGNOSIS — E785 Hyperlipidemia, unspecified: Secondary | ICD-10-CM | POA: Diagnosis not present

## 2015-06-13 DIAGNOSIS — G9340 Encephalopathy, unspecified: Secondary | ICD-10-CM | POA: Diagnosis not present

## 2015-06-13 LAB — GLUCOSE, CAPILLARY
GLUCOSE-CAPILLARY: 91 mg/dL (ref 65–99)
GLUCOSE-CAPILLARY: 119 mg/dL — AB (ref 65–99)
GLUCOSE-CAPILLARY: 137 mg/dL — AB (ref 65–99)
Glucose-Capillary: 81 mg/dL (ref 65–99)

## 2015-06-13 MED ORDER — INSULIN ASPART 100 UNIT/ML ~~LOC~~ SOLN: 0.0000 [IU] | Freq: Every day | SUBCUTANEOUS | Status: DC

## 2015-06-13 MED ORDER — KETOROLAC TROMETHAMINE 15 MG/ML IJ SOLN: 15.0000 mg | Freq: Once | INTRAMUSCULAR | Status: DC

## 2015-06-13 MED ORDER — INSULIN ASPART 100 UNIT/ML ~~LOC~~ SOLN
0.0000 [IU] | Freq: Three times a day (TID) | SUBCUTANEOUS | Status: DC
  Administered 2015-06-14: 13:00:00 3 [IU] via SUBCUTANEOUS
  Administered 2015-06-14: 18:00:00 2 [IU] via SUBCUTANEOUS
  Filled 2015-06-13: qty 2
  Filled 2015-06-13: qty 3

## 2015-06-13 NOTE — Progress Notes (Signed)
[facility] Physicians - [place] at [facility]   PATIENT NAME: Andres [name]    MR#:  [id number]  DATE OF BIRTH:  [birth year]  SUBJECTIVE:  CHIEF COMPLAINT:   Patient is resting comfortably, unclear speech, somewhat droopy mouth , diarrhea is better. No past medical history of stroke per son  REVIEW OF SYSTEMS:  CONSTITUTIONAL: No fever, fatigue or weakness.  EYES: No blurred or double vision.  EARS, NOSE, AND THROAT: No tinnitus or ear pain.  RESPIRATORY: No cough, shortness of breath, wheezing or hemoptysis.  CARDIOVASCULAR: No chest pain, orthopnea, edema.  GASTROINTESTINAL: No nausea, vomiting, improving diarrhea, no abdominal pain.  GENITOURINARY: No dysuria, hematuria.  ENDOCRINE: No polyuria, nocturia,  HEMATOLOGY: No anemia, easy bruising or bleeding SKIN: No rash or lesion. MUSCULOSKELETAL: No joint pain or arthritis.   NEUROLOGIC: No tingling, numbness. Mumbling speech, with   droopy mouth which are new    DRUG ALLERGIES:  No Known Allergies  VITALS:  Blood pressure 117/50, pulse 59, temperature 97.6 F (36.4 C), temperature source Oral, resp. rate 18, height  (1.727 m), weight 53.207 kg (117 lb 4.8 oz), SpO2 100 %.  PHYSICAL EXAMINATION:  GENERAL:  81 y.o.-year-old patient lying in the bed with no acute distress.  EYES: Pupils equal, round, reactive to light and accommodation. No scleral icterus. HEENT: Head atraumatic, normocephalic. Oropharynx and nasopharynx clear.  NECK:  Supple, no jugular venous distention.   LUNGS: Normal breath sounds bilaterally, no wheezing, rales,rhonchi or crepitation. No use of accessory muscles of respiration.  CARDIOVASCULAR: S1, S2 normal. No murmurs, rubs, or gallops.  ABDOMEN: Soft, nontender, nondistended. Bowel sounds present. No organomegaly or mass.  EXTREMITIES: No pedal edema, cyanosis, or clubbing.  NEUROLOGIC: Strength is 5 out of 5 in all extremities.  Garbled speech. Sensation intact.reflexes are 2+   Gait not checked.  PSYCHIATRIC: The patient is alert and oriented x 2- 3.  SKIN: No obvious rash, lesion. Sacral decub with his ulcers stage II   LABORATORY PANEL:   CBC  Recent Labs Lab 06/11/15 2031  WBC 10.0  HGB 9.7*  HCT 29.3*  PLT 196   ------------------------------------------------------------------------------------------------------------------  Chemistries   Recent Labs Lab 06/11/15 2031  NA 141  K 5.1  CL 104  CO2 26  GLUCOSE 63*  BUN 43*  CREATININE 1.87*  CALCIUM 9.8   ------------------------------------------------------------------------------------------------------------------  Cardiac Enzymes  Recent Labs Lab 06/11/15 2023  TROPONINI 0.03   ------------------------------------------------------------------------------------------------------------------  RADIOLOGY:  Ct Head Wo Contrast  06/11/2015   CLINICAL DATA:  Altered mental status.  Generalized weakness.  EXAM: CT HEAD WITHOUT CONTRAST  TECHNIQUE: Contiguous axial images were obtained from the base of the skull through the vertex without intravenous contrast.  COMPARISON:  03/02/2013  FINDINGS: There is no intracranial hemorrhage, mass or evidence of acute infarction. There is no extra-axial fluid collection. There is moderately severe cerebral and cerebellar atrophy. There is white matter hypodensity consistent with chronic microvascular disease. There are chronic lacunar infarctions involving the left caudate, left anterior lentiform nuclei, right subinsular region. No interval change is evident.  There is chronic opacification of the right maxillary sinus with associated bony sclerosis. No bony destruction is evident. This is also unchanged.  IMPRESSION: Moderately severe generalized atrophy and chronic microvascular disease. Chronic lacunar infarctions in the basal ganglia. No acute intracranial findings.  Chronic right maxillary sinus opacification.   Electronically Signed   By: Ellery Plunk M.D.   On: 06/11/2015 21:11    EKG:   Orders  placed or performed during the hospital encounter of 06/11/15  . ED EKG  . ED EKG  . EKG 12-Lead  . EKG 12-Lead    ASSESSMENT AND PLAN:   81 year old Caucasian gentleman history of type 2 diabetes uncomplicated, history of CVA without deficit presenting with altered mental status.  #. Encephalopathy, metabolic: Combination of hypoglycemia as well as UTI:  Glucose has improved, mental status is relatively better, monitor closely  follow urine culture and sensitivity    #dysarthria  CT head with no acute findings  will get MRI of the brain PT evaluation Neurology consult is placed Neuro checks every 4 hours Check fasting lipid panel in a.m. Provide aspirin     #. Urinary tract infection, site unspecified:   greater than 100,000 colonies and urine culture, sensitivities pending  Complicated by indwelling Foley catheter present for approximately 2 weeks,  Continue ceftriaxone,  # Diarrhea C. difficile toxin is negative. Improved with  Imodium Check BMP in a.m.   #Type 2 diabetes, hypoglycemic: Hold oral agents follow Accu-Cheks   #Hyperlipidemia unspecified: Statin therapy  #. Pressure ulcer on coccyx stage II Reposition patient every 2 hours  #moderate malnutrition Provide supplements as recommended by dietitian.  # Venous thromboembolism prophylactic: Heparin subcutaneous     All the records are reviewed and case discussed with Care Management/Social Workerr. Management plans discussed with the patient, family and they are in agreement.  CODE STATUS: Full code  TOTAL TIME TAKING CARE OF THIS PATIENT:  reviewing medical records, follow-up, progress note and coronation of care-35 minutes.   POSSIBLE D/C IN 3 DAYS, DEPENDING ON CLINICAL CONDITION.   Ramonita Lab M.D on 06/13/2015 at 12:56 PM  Between 7am to 6pm - Pager - [phone] After 6pm go to [email or web address] - password [email or web address] [facility]  [place]  [place] [facility]  Office  [phone]  CC: Primary care physician; Tillman Abide, MD

## 2015-06-13 NOTE — Plan of Care (Signed)
Problem: Discharge Progression Outcomes Goal: Discharge plan in place and appropriate Individualism Outcome: Progressing Pt prefers to be called Andres [name]. Lives at home with son. Has home health RN everyday to assist with bath, grooming, adls HOH, wears hearing aid Left ear. Goal: Other Discharge Outcomes/Goals Outcome: Progressing Plan of care progress to goal: Pain - pt complains of no pain this shift Hemodynamically stable - VSS Complications - per day shift report, pts son stated that slurred speech was a new symptom and that he will talk with the MD about it next rounding time Diet - tolerating Activity - pt remains in bed

## 2015-06-13 NOTE — Consult Note (Signed)
CC: dysarthria   HPI: Andres [name] is an 81 y.o. male with a known history of dementia, type 2 diabetes non-insulin-requiring uncomplicated, stroke without residual deficit presenting with altered mental status. Pt ws found to have UTI and now being treated.   Past Medical History  Diagnosis Date  . CAD (coronary artery disease)   . Diverticulitis   . GERD (gastroesophageal reflux disease)   . Hyperlipidemia   . Hypertension   . BPH (benign prostatic hypertrophy)   . Diabetes mellitus   . DJD (degenerative joint disease)     lumbar spine  . IBS (irritable bowel syndrome)   . Stroke   . Depression 2012  . Acute MI 09/2012  . Chronic kidney disease     ACUTE RENAL FAILURE  . Foley catheter in place 05/09/15    for 1 year per family    Past Surgical History  Procedure Laterality Date  . Angioplasty  1995  . Doppler echocardiography  11/1999    syncope  . Pacemaker insertion      Pacer/cath  (ef 65%)  1/01 -  guidant Discovery II DR pulse generator  . Prostate surgery  07/2003  . Esophagogastroduodenoscopy  07/2005    neg  . Cardiac catheterization  09/2012    NSTEMI/STENT PLACED  . Insert / replace / remove pacemaker      [id number]    Family History  Problem Relation Age of Onset  . Diabetes Sister     Social History:  reports that he has quit smoking. His smoking use included Cigarettes. He has never used smokeless tobacco. He reports that he does not drink alcohol or use illicit drugs.  No Known Allergies  Medications: I have reviewed the patient's current medications.  ROS: Not able to obtain due to hx of dementia   Physical Examination: Blood pressure 99/49, pulse 60, temperature 97.9 F (36.6 C), temperature source Oral, resp. rate 20, height 5\' 8"  (1.727 m), weight 53.207 kg (117 lb 4.8 oz), SpO2 96 %.    Neurological Examination Mental Status: Hx of dementia confused, disoriented.  Cranial Nerves: II: Discs flat bilaterally; Visual fields  grossly normal, pupils equal, round, reactive to light and accommodation III,IV, VI: ptosis not present, extra-ocular motions intact bilaterally V,VII: smile symmetric, facial light touch sensation normal bilaterally VIII: hearing normal bilaterally IX,X: gag reflex present XI: bilateral shoulder shrug XII: midline tongue extension Motor: Right : Upper extremity   4/5    Left:     Upper extremity   4/5  Lower extremity   4/5     Lower extremity   4/5 Tone and bulk:normal tone throughout; no atrophy noted Sensory: Pinprick and light touch intact throughout, bilaterally Deep Tendon Reflexes: 1+ and symmetric throughout Plantars: Right: downgoing   Left: downgoing Cerebellar: normal finger-to-nose, normal rapid alternating movements and normal heel-to-shin test Gait: normal gait and station      Laboratory Studies:   Basic Metabolic Panel:  Recent Labs Lab 06/11/15 2031  NA 141  K 5.1  CL 104  CO2 26  GLUCOSE 63*  BUN 43*  CREATININE 1.87*  CALCIUM 9.8    Liver Function Tests: No results for input(s): AST, ALT, ALKPHOS, BILITOT, PROT, ALBUMIN in the last 168 hours. No results for input(s): LIPASE, AMYLASE in the last 168 hours. No results for input(s): AMMONIA in the last 168 hours.  CBC:  Recent Labs Lab 06/11/15 2031  WBC 10.0  HGB 9.7*  HCT 29.3*  MCV 97.2  PLT  196    Cardiac Enzymes:  Recent Labs Lab 06/11/15 2023  TROPONINI 0.03    BNP: Invalid input(s): POCBNP  CBG:  Recent Labs Lab 06/12/15 1632 06/12/15 1714 06/12/15 2234 06/13/15 0727 06/13/15 1120  GLUCAP 65 111* 118* 91 119*    Microbiology: Results for orders placed or performed during the hospital encounter of 06/11/15  Urine culture     Status: None (Preliminary result)   Collection Time: 06/11/15  8:36 PM  Result Value Ref Range Status   Specimen Description URINE, CLEAN CATCH  Final   Special Requests NONE  Final   Culture   Final    >=100,000 COLONIES/mL GRAM NEGATIVE  RODS IDENTIFICATION AND SUSCEPTIBILITIES TO FOLLOW    Report Status PENDING  Incomplete  Blood culture (routine x 2)     Status: None (Preliminary result)   Collection Time: 06/11/15 10:14 PM  Result Value Ref Range Status   Specimen Description BLOOD BLOOD RIGHT FOREARM  Final   Special Requests BOTTLES DRAWN AEROBIC AND ANAEROBIC 1CC  Final   Culture NO GROWTH 2 DAYS  Final   Report Status PENDING  Incomplete  Blood culture (routine x 2)     Status: None (Preliminary result)   Collection Time: 06/11/15 10:14 PM  Result Value Ref Range Status   Specimen Description BLOOD RIGHT ASSIST CONTROL  Final   Special Requests BOTTLES DRAWN AEROBIC AND ANAEROBIC 6CC  Final   Culture NO GROWTH 2 DAYS  Final   Report Status PENDING  Incomplete  C difficile quick scan w PCR reflex     Status: None   Collection Time: 06/12/15 11:48 AM  Result Value Ref Range Status   C Diff antigen NEGATIVE NEGATIVE Final   C Diff toxin NEGATIVE NEGATIVE Final   C Diff interpretation Negative for C. difficile  Final    Coagulation Studies: No results for input(s): LABPROT, INR in the last 72 hours.  Urinalysis:  Recent Labs Lab 06/11/15 2036  COLORURINE YELLOW*  LABSPEC 1.015  PHURINE 5.0  GLUCOSEU NEGATIVE  HGBUR NEGATIVE  BILIRUBINUR NEGATIVE  KETONESUR TRACE*  PROTEINUR 30*  NITRITE NEGATIVE  LEUKOCYTESUR 3+*    Lipid Panel:     Component Value Date/Time   CHOL 127 12/17/2014 1625   CHOL 160 09/20/2012 0548   TRIG * 12/17/2014 1625    450.0 Triglyceride is over 400; calculations on Lipids are invalid.   TRIG 120 09/20/2012 0548   HDL 29.00* 12/17/2014 1625   HDL 43 09/20/2012 0548   CHOLHDL 4 12/17/2014 1625   VLDL 60.8* 10/26/2013 1718   VLDL 24 09/20/2012 0548   LDLCALC 54 11/14/2012 1711   LDLCALC 93 09/20/2012 0548    HgbA1C:  Lab Results  Component Value Date   HGBA1C 6.4* 05/09/2015    Urine Drug Screen:  No results found for: LABOPIA, COCAINSCRNUR, LABBENZ, AMPHETMU,  THCU, LABBARB  Alcohol Level: No results for input(s): ETH in the last 168 hours.  Other results: EKG: normal EKG, normal sinus rhythm, unchanged from previous tracings.  Imaging: Ct Head Wo Contrast  06/11/2015   CLINICAL DATA:  Altered mental status.  Generalized weakness.  EXAM: CT HEAD WITHOUT CONTRAST  TECHNIQUE: Contiguous axial images were obtained from the base of the skull through the vertex without intravenous contrast.  COMPARISON:  03/02/2013  FINDINGS: There is no intracranial hemorrhage, mass or evidence of acute infarction. There is no extra-axial fluid collection. There is moderately severe cerebral and cerebellar atrophy. There is white matter hypodensity consistent  with chronic microvascular disease. There are chronic lacunar infarctions involving the left caudate, left anterior lentiform nuclei, right subinsular region. No interval change is evident.  There is chronic opacification of the right maxillary sinus with associated bony sclerosis. No bony destruction is evident. This is also unchanged.  IMPRESSION: Moderately severe generalized atrophy and chronic microvascular disease. Chronic lacunar infarctions in the basal ganglia. No acute intracranial findings.  Chronic right maxillary sinus opacification.   Electronically Signed   By: Ellery Plunk M.D.   On: 06/11/2015 21:11     Assessment/Plan:  Andres [name] is an 81 y.o. male with a known history of dementia, type 2 diabetes non-insulin-requiring uncomplicated, stroke without residual deficit presenting with altered mental status. Pt ws found to have UTI and now being treated.   Slightly dysarthric speech and generalized weakness in setting of UTI, diarhea - pt has baseline dementia.  - does not need any further neurological work up - hydration 06/13/2015, 1:56 PM

## 2015-06-13 NOTE — Evaluation (Signed)
Physical Therapy Evaluation Patient Details Name: Andres [name] MRN: [id number] DOB: 1926/07/30 Today's Date: 06/13/2015   History of Present Illness  81 yo male recently in SNF after hospital was referred to HHPT and now back.  Has weakness looking like CVA with no CT findings, metabolic encephalopathy and UTI, hypoglycemia, AMS   Clinical Impression  Pt was very much struggling to understand PT and did repeat instructions with gestures to help him.  Pt is weak and minimally able to move, mod assist to maintain standing to try to sidestep up bedside with 3 total steps and sits.  Will not be able to go home at [facility] and may need a return to SNF if he still has access to this care.    Follow Up Recommendations SNF    Equipment Recommendations  None recommended by PT    Recommendations for Other Services       Precautions / Restrictions Precautions Precautions: Fall (Foley catheter) Restrictions Weight Bearing Restrictions: No      Mobility  Bed Mobility Overal bed mobility: Needs Assistance Bed Mobility: Supine to Sit;Sit to Supine     Supine to sit: Max assist Sit to supine: Max assist   General bed mobility comments: no initiation by pt and limited follow through til end of session with mod assist to roll  Transfers Overall transfer level: Needs assistance Equipment used: Rolling walker (2 wheeled);1 person hand held assist Transfers: Sit to/from Stand Sit to Stand: Mod assist         General transfer comment: assist from elevated surface to walker and to sidestep up bedside  Ambulation/Gait             General Gait Details: unable x sidesteps to move up bedside  Stairs            Wheelchair Mobility    Modified Rankin (Stroke Patients Only) Modified Rankin (Stroke Patients Only) Pre-Morbid Rankin Score: Moderate disability Modified Rankin: Moderately severe disability     Balance Overall balance assessment: Needs assistance Sitting-balance  support: Feet supported Sitting balance-Leahy Scale: Poor   Postural control: Posterior lean Standing balance support: Bilateral upper extremity supported Standing balance-Leahy Scale: Poor                               Pertinent Vitals/Pain Pain Assessment: No/denies pain    Home Living Family/patient expects to be discharged to:: Private residence Living Arrangements: Children Available Help at Discharge: Family;Personal care attendant Type of Home: House Home Access: Stairs to enter Entrance Stairs-Rails: Right;Left;Can reach both Entrance Stairs-Number of Steps: 3 Home Layout: Two level;Able to live on main level with bedroom/bathroom        Prior Function Level of Independence: Needs assistance   Gait / Transfers Assistance Needed: RW with independence  ADL's / Homemaking Assistance Needed: family and caregiver assistance        Hand Dominance        Extremity/Trunk Assessment   Upper Extremity Assessment: Generalized weakness           Lower Extremity Assessment: Generalized weakness      Cervical / Trunk Assessment: Kyphotic  Communication   Communication: HOH;Other (comment) (Hearing aids in and not working)  Cognition Arousal/Alertness: Lethargic Behavior During Therapy: Anxious;Flat affect Overall Cognitive Status: No family/caregiver present to determine baseline cognitive functioning       Memory: Decreased recall of precautions;Decreased short-term memory  General Comments General comments (skin integrity, edema, etc.): Pt is slow to respond to requests for movement, cannot tolerate much and quickly asks to get back to bed    Exercises        Assessment/Plan    PT Assessment Patient needs continued PT services  PT Diagnosis Generalized weakness   PT Problem List Decreased strength;Decreased range of motion;Decreased activity tolerance;Decreased balance;Decreased mobility;Decreased  coordination;Decreased cognition;Decreased knowledge of use of DME;Decreased safety awareness;Decreased knowledge of precautions;Cardiopulmonary status limiting activity  PT Treatment Interventions DME instruction;Gait training;Functional mobility training;Therapeutic activities;Stair training;Therapeutic exercise;Balance training;Neuromuscular re-education;Cognitive remediation;Patient/family education   PT Goals (Current goals can be found in the Care Plan section) Acute Rehab PT Goals Patient Stated Goal: none stated PT Goal Formulation: Patient unable to participate in goal setting Time For Goal Achievement: 06/27/15 Potential to Achieve Goals: Fair    Frequency Min 2X/week   Barriers to discharge Other (comment) (dependent on help for all mobiltiy and was independent walke)      Co-evaluation               End of Session Equipment Utilized During Treatment: Gait belt Activity Tolerance: Patient limited by fatigue;Patient limited by lethargy Patient left: in bed;with call bell/phone within reach;with bed alarm set Nurse Communication: Mobility status    Functional Assessment Tool Used: clinical judgment Functional Limitation: Mobility: Walking and moving around Mobility: Walking and Moving Around Current Status (Z6109): At least 40 percent but less than 60 percent impaired, limited or restricted Mobility: Walking and Moving Around Goal Status [phone]): At least 40 percent but less than 60 percent impaired, limited or restricted    Time: 0981-1914 PT Time Calculation (min) (ACUTE ONLY): 24 min   Charges:   PT Evaluation $Initial PT Evaluation Tier I: 1 Procedure PT Treatments $Therapeutic Activity: 8-22 mins   PT G Codes:   PT G-Codes **NOT FOR INPATIENT CLASS** Functional Assessment Tool Used: clinical judgment Functional Limitation: Mobility: Walking and moving around Mobility: Walking and Moving Around Current Status (N8295): At least 40 percent but less than 60  percent impaired, limited or restricted Mobility: Walking and Moving Around Goal Status [phone]): At least 40 percent but less than 60 percent impaired, limited or restricted    Ivar Drape 06/13/2015, 3:45 PM   Samul Dada, PT MS Acute Rehab Dept. Number: [facility] [id number] and [facility] [phone]

## 2015-06-13 NOTE — Progress Notes (Signed)
Notified Dr. Amado Coe that pt can not have MRI due to pacemaker, MD acknowledged and will call patient's son, telephone conversation.

## 2015-06-13 NOTE — Plan of Care (Signed)
Problem: Discharge Progression Outcomes Goal: Activity appropriate for discharge plan Pt is alert with confusion, hard of hearing, sleeping in between care, denies pain, IV fluids infusing, receiving IV antiobiotics, bedridden at this time, Unable to perform MRI due to pacemaker, afebrile throughout shift, encouraging intake of supplement drinks. Spoke with son via telephone, MD provided update on care as well. Neuro consult entered.

## 2015-06-13 NOTE — Progress Notes (Addendum)
error 

## 2015-06-14 DIAGNOSIS — E119 Type 2 diabetes mellitus without complications: Secondary | ICD-10-CM | POA: Diagnosis not present

## 2015-06-14 DIAGNOSIS — E785 Hyperlipidemia, unspecified: Secondary | ICD-10-CM | POA: Diagnosis not present

## 2015-06-14 DIAGNOSIS — N390 Urinary tract infection, site not specified: Secondary | ICD-10-CM | POA: Diagnosis not present

## 2015-06-14 DIAGNOSIS — G9340 Encephalopathy, unspecified: Secondary | ICD-10-CM | POA: Diagnosis not present

## 2015-06-14 LAB — GLUCOSE, CAPILLARY
GLUCOSE-CAPILLARY: 167 mg/dL — AB (ref 65–99)
Glucose-Capillary: 93 mg/dL (ref 65–99)
Glucose-Capillary: 221 mg/dL — ABNORMAL HIGH (ref 65–99)

## 2015-06-14 LAB — LIPID PANEL
Cholesterol: 103 mg/dL (ref 0–200)
HDL: 27 mg/dL — ABNORMAL LOW (ref >40)
LDL CALC: 53 mg/dL (ref 0–99)
Total CHOL/HDL Ratio: 3.8 RATIO
Triglycerides: 115 mg/dL (ref <150)
VLDL: 23 mg/dL (ref 0–40)

## 2015-06-14 MED ORDER — CIPROFLOXACIN HCL 500 MG PO TABS
500.0000 mg | ORAL_TABLET | Freq: Two times a day (BID) | ORAL | Status: DC
  Administered 2015-06-14: 500 mg via ORAL
  Filled 2015-06-14: qty 1

## 2015-06-14 MED ORDER — CEPHALEXIN 500 MG PO CAPS: 500.0000 mg | ORAL_CAPSULE | Freq: Two times a day (BID) | ORAL | Status: DC

## 2015-06-14 MED ORDER — SACCHAROMYCES BOULARDII 250 MG PO CAPS: 250.0000 mg | ORAL_CAPSULE | Freq: Two times a day (BID) | ORAL | Status: DC

## 2015-06-14 MED ORDER — ACETAMINOPHEN 325 MG PO TABS: 650.0000 mg | ORAL_TABLET | Freq: Four times a day (QID) | ORAL | Status: AC | PRN

## 2015-06-14 MED ORDER — CEPHALEXIN 500 MG PO CAPS
500.0000 mg | ORAL_CAPSULE | Freq: Two times a day (BID) | ORAL | Status: DC
  Administered 2015-06-14: 500 mg via ORAL
  Filled 2015-06-14 (×2): qty 1

## 2015-06-14 MED ORDER — ENSURE ENLIVE PO LIQD: 237.0000 mL | Freq: Three times a day (TID) | ORAL | Status: DC

## 2015-06-14 NOTE — Progress Notes (Signed)
MD making rounds. Discharge orders received. IV removed. Prescriptions given to patient and son.  Discharge paperwork provided, explained, signed and witnessed. No unanswered questions. Escorted via wheelchair by nursing staff. All belongings sent with patient and family.

## 2015-06-14 NOTE — Plan of Care (Signed)
Problem: Discharge Progression Outcomes Goal: Discharge plan in place and appropriate Individualism Outcome: Progressing Pt prefers to be called Andres [name]. Lives at home with son. Has home health RN everyday to assist with bath, grooming, adls HOH, wears hearing aid Left ear. Goal: Other Discharge Outcomes/Goals Outcome: Progressing Plan of care progress to goal: Pain - pt complains of no pain Hemodynamically stable - VSS Diet - dys 3 Activity - pt remains in bed

## 2015-06-14 NOTE — Discharge Summary (Signed)
[facility] [place] [facility]   PATIENT NAME: Andres [name]    MR#:  [id number]  DATE OF BIRTH:  29-May-1926  DATE OF ADMISSION:  06/11/2015 ADMITTING PHYSICIAN: Wyatt Haste, MD  DATE OF DISCHARGE: 06/14/2015  PRIMARY CARE PHYSICIAN: Tillman Abide, MD    ADMISSION DIAGNOSIS:  UTI (lower urinary tract infection) [N39.0] Altered mental status, unspecified altered mental status type [R41.82]  DISCHARGE DIAGNOSIS:  Active Problems:   Encephalopathy   UTI (lower urinary tract infection)   Pressure ulcer   Malnutrition of moderate degree   SECONDARY DIAGNOSIS:   Past Medical History  Diagnosis Date  . CAD (coronary artery disease)   . Diverticulitis   . GERD (gastroesophageal reflux disease)   . Hyperlipidemia   . Hypertension   . BPH (benign prostatic hypertrophy)   . Diabetes mellitus   . DJD (degenerative joint disease)     lumbar spine  . IBS (irritable bowel syndrome)   . Stroke   . Depression 2012  . Acute MI 09/2012  . Chronic kidney disease     ACUTE RENAL FAILURE  . Foley catheter in place 05/09/15    for 1 year per family    HOSPITAL COURSE:   81 year old Caucasian gentleman history of type 2 diabetes uncomplicated, history of CVA without deficit presenting with altered mental status.  #. Encephalopathy, metabolic: Combination of hypoglycemia as well as UTI: Glucose has improved, mental status is relatively better, close to his baseline    #dysarthria probably from dementia worsened by acute cystitis CT head with no acute findings  Could not get MRI of the brain as patient has pacemaker PT evaluation-recommended a skilled nursing facility but son wants to take him home with home PT Neurology has recommended no further workup and don't think this is TIA or acute CVA Continue aspirin and Plavix   #. Urinary tract infection, site unspecified:  greater than 100,000 colonies of Klebsiella pneumonia, sensitive to  Keflex. Complicated by indwelling Foley catheter present for approximately 2 weeks, replaced with new Foley Given ceftriaxone during hospital course, will discharge him home with by mouth Keflex for 5 more days with probiotics  # Diarrhea C. difficile toxin is negative. Improved with Imodium    #Type 2 diabetes, hypoglycemic: Resume oral agents , diabetic diet  #Hyperlipidemia unspecified: Statin therapy  #. Pressure ulcer on coccyx stage II Reposition patient every 2 hours Started on nutritional supplements  #moderate malnutrition Provide supplements as recommended by dietitian.  # Venous thromboembolism prophylactic: Provided with Heparin subcutaneous  DISCHARGE CONDITIONS:   Fair  CONSULTS OBTAINED:  Treatment Team:  Wyatt Haste, MD Pauletta Browns, MD   PROCEDURES none  DRUG ALLERGIES:  No Known Allergies  DISCHARGE MEDICATIONS:   Current Discharge Medication List    START taking these medications   Details  acetaminophen (TYLENOL) 325 MG tablet Take 2 tablets (650 mg total) by mouth every 6 (six) hours as needed for mild pain (or Fever >/= 101).    cephALEXin (KEFLEX) 500 MG capsule Take 1 capsule (500 mg total) by mouth every 12 (twelve) hours. Qty: 10 capsule, Refills: 0    feeding supplement, ENSURE ENLIVE, (ENSURE ENLIVE) LIQD Take 237 mLs by mouth 3 (three) times daily with meals. Qty: 237 mL, Refills: 12    saccharomyces boulardii (FLORASTOR) 250 MG capsule Take 1 capsule (250 mg total) by mouth 2 (two) times daily. Qty: 20 capsule, Refills: 0      CONTINUE these medications which  have NOT CHANGED   Details  aspirin EC 81 MG tablet Take 81 mg by mouth daily.    atorvastatin (LIPITOR) 10 MG tablet Take 10 mg by mouth daily.     citalopram (CELEXA) 10 MG tablet Take 10 mg by mouth daily.    clopidogrel (PLAVIX) 75 MG tablet Take 75 mg by mouth daily.    docusate sodium (COLACE) 100 MG capsule Take 100 mg by mouth 2 (two) times daily as  needed for mild constipation.     finasteride (PROSCAR) 5 MG tablet Take 5 mg by mouth daily.    fluticasone (FLONASE) 50 MCG/ACT nasal spray Place 2 sprays into the nose daily. Qty: 16 g, Refills: 1    glipiZIDE (GLUCOTROL XL) 5 MG 24 hr tablet Take 5 mg by mouth daily.    metFORMIN (GLUCOPHAGE) 1000 MG tablet Take 500 mg by mouth 2 (two) times daily.    metoprolol tartrate (LOPRESSOR) 25 MG tablet Take 0.5 tablets (12.5 mg total) by mouth 2 (two) times daily. Qty: 180 tablet, Refills: 1    Multiple Vitamin (MULTIVITAMIN WITH MINERALS) TABS tablet Take 1 tablet by mouth daily.    nitroGLYCERIN (NITROSTAT) 0.4 MG SL tablet Place 0.4 mg under the tongue every 5 (five) minutes as needed for chest pain.     triamcinolone cream (KENALOG) 0.1 % Apply 1 application topically 2 (two) times daily as needed. For itchy areas Qty: 30 g, Refills: 2    albuterol (PROVENTIL) (2.5 MG/3ML) 0.083% nebulizer solution Take 3 mLs (2.5 mg total) by nebulization every 2 (two) hours as needed for wheezing. Qty: 75 mL, Refills: 12         DISCHARGE INSTRUCTIONS:     DIET:  Diabetic diet, healthy heart  DISCHARGE CONDITION:  Fair  ACTIVITY:  Activity as tolerated, per home PT  OXYGEN:  Home Oxygen: No.   Oxygen Delivery: room air  DISCHARGE LOCATION:  home   If you experience worsening of your admission symptoms, develop shortness of breath, life threatening emergency, suicidal or homicidal thoughts you must seek medical attention immediately by calling 911 or calling your MD immediately  if symptoms less severe.  You Must read complete instructions/literature along with all the possible adverse reactions/side effects for all the Medicines you take and that have been prescribed to you. Take any new Medicines after you have completely understood and accpet all the possible adverse reactions/side effects.   Please note  You were cared for by a hospitalist during your hospital stay. If you  have any questions about your discharge medications or the care you received while you were in the hospital after you are discharged, you can call the unit and asked to speak with the hospitalist on call if the hospitalist that took care of you is not available. Once you are discharged, your primary care physician will handle any further medical issues. Please note that NO REFILLS for any discharge medications will be authorized once you are discharged, as it is imperative that you return to your primary care physician (or establish a relationship with a primary care physician if you do not have one) for your aftercare needs so that they can reassess your need for medications and monitor your lab values.     Today  Chief Complaint  Patient presents with  . Altered Mental Status  . Weakness   Patient is resting comfortably. Feeling better. Mumbling speech is getting better. His mouth seemed to be deviated but when patient smiles there is  no deviation of the angle of the mouth   ROS: Denies any CONSTITUTIONAL: Denies fevers, chills. Denies any fatigue, weakness.  EYES: Denies blurry vision, double vision, eye pain. EARS, NOSE, THROAT: Denies tinnitus, ear pain, hearing loss. RESPIRATORY: Denies cough, wheeze, shortness of breath.  CARDIOVASCULAR: Denies chest pain, palpitations, edema.  GASTROINTESTINAL: Denies nausea, vomiting, diarrhea, abdominal pain. Denies bright red blood per rectum. GENITOURINARY: Denies dysuria, hematuria. ENDOCRINE: Denies nocturia or thyroid problems. HEMATOLOGIC AND LYMPHATIC: Denies easy bruising or bleeding. SKIN: Denies rash or lesion. MUSCULOSKELETAL: Denies pain in neck, back, shoulder, knees, hips or arthritic symptoms.  NEUROLOGIC: Denies paralysis, paresthesias.  PSYCHIATRIC: Denies anxiety or depressive symptoms.   VITAL SIGNS:  Blood pressure 107/54, pulse 59, temperature 98.5 F (36.9 C), temperature source Oral, resp. rate 20, height 5\' 8"  (1.727  m), weight 53.207 kg (117 lb 4.8 oz), SpO2 99 %.  I/O:   Intake/Output Summary (Last 24 hours) at 06/14/15 1507 Last data filed at 06/14/15 1417  Gross per 24 hour  Intake   1595 ml  Output    500 ml  Net   1095 ml    PHYSICAL EXAMINATION:  GENERAL:  81 y.o.-year-old patient lying in the bed with no acute distress.  EYES: Pupils equal, round, reactive to light and accommodation. No scleral icterus. Extraocular muscles intact.  HEENT: Head atraumatic, normocephalic. Oropharynx and nasopharynx clear.  NECK:  Supple, no jugular venous distention. No thyroid enlargement, no tenderness.  LUNGS: Normal breath sounds bilaterally, no wheezing, rales,rhonchi or crepitation. No use of accessory muscles of respiration.  CARDIOVASCULAR: S1, S2 normal. No murmurs, rubs, or gallops.  ABDOMEN: Soft, non-tender, non-distended. Bowel sounds present. No organomegaly or mass.  EXTREMITIES: No pedal edema, cyanosis, or clubbing.  NEUROLOGIC: Cranial nerves II through XII are intact. Muscle strength 5/5 in all extremities. Sensation intact. Gait not checked. Speech is muffled PSYCHIATRIC: The patient is alert and oriented x 3.  SKIN: No obvious rash, lesion, or ulcer.   DATA REVIEW:   CBC  Recent Labs Lab 06/11/15 2031  WBC 10.0  HGB 9.7*  HCT 29.3*  PLT 196    Chemistries   Recent Labs Lab 06/11/15 2031  NA 141  K 5.1  CL 104  CO2 26  GLUCOSE 63*  BUN 43*  CREATININE 1.87*  CALCIUM 9.8    Cardiac Enzymes  Recent Labs Lab 06/11/15 2023  TROPONINI 0.03    Microbiology Results  Results for orders placed or performed during the hospital encounter of 06/11/15  Urine culture     Status: None   Collection Time: 06/11/15  8:36 PM  Result Value Ref Range Status   Specimen Description URINE, CLEAN CATCH  Final   Special Requests NONE  Final   Culture >=100,000 COLONIES/mL KLEBSIELLA PNEUMONIAE  Final   Report Status 06/14/2015 FINAL  Final   Organism ID, Bacteria KLEBSIELLA  PNEUMONIAE  Final      Susceptibility   Klebsiella pneumoniae - MIC*    AMPICILLIN >=32 RESISTANT Resistant     CEFAZOLIN <=4 SENSITIVE Sensitive     CEFTRIAXONE <=1 SENSITIVE Sensitive     CIPROFLOXACIN <=0.25 SENSITIVE Sensitive     GENTAMICIN <=1 SENSITIVE Sensitive     IMIPENEM <=0.25 SENSITIVE Sensitive     NITROFURANTOIN 64 INTERMEDIATE Intermediate     TRIMETH/SULFA <=20 SENSITIVE Sensitive     PIP/TAZO Value in next row Sensitive      SENSITIVE<=4    * >=100,000 COLONIES/mL KLEBSIELLA PNEUMONIAE  Blood culture (routine x  2)     Status: None (Preliminary result)   Collection Time: 06/11/15 10:14 PM  Result Value Ref Range Status   Specimen Description BLOOD BLOOD RIGHT FOREARM  Final   Special Requests BOTTLES DRAWN AEROBIC AND ANAEROBIC 1CC  Final   Culture NO GROWTH 3 DAYS  Final   Report Status PENDING  Incomplete  Blood culture (routine x 2)     Status: None (Preliminary result)   Collection Time: 06/11/15 10:14 PM  Result Value Ref Range Status   Specimen Description BLOOD RIGHT ASSIST CONTROL  Final   Special Requests BOTTLES DRAWN AEROBIC AND ANAEROBIC 6CC  Final   Culture NO GROWTH 3 DAYS  Final   Report Status PENDING  Incomplete  C difficile quick scan w PCR reflex     Status: None   Collection Time: 06/12/15 11:48 AM  Result Value Ref Range Status   C Diff antigen NEGATIVE NEGATIVE Final   C Diff toxin NEGATIVE NEGATIVE Final   C Diff interpretation Negative for C. difficile  Final    RADIOLOGY:  Ct Head Wo Contrast  06/11/2015   CLINICAL DATA:  Altered mental status.  Generalized weakness.  EXAM: CT HEAD WITHOUT CONTRAST  TECHNIQUE: Contiguous axial images were obtained from the base of the skull through the vertex without intravenous contrast.  COMPARISON:  03/02/2013  FINDINGS: There is no intracranial hemorrhage, mass or evidence of acute infarction. There is no extra-axial fluid collection. There is moderately severe cerebral and cerebellar atrophy. There  is white matter hypodensity consistent with chronic microvascular disease. There are chronic lacunar infarctions involving the left caudate, left anterior lentiform nuclei, right subinsular region. No interval change is evident.  There is chronic opacification of the right maxillary sinus with associated bony sclerosis. No bony destruction is evident. This is also unchanged.  IMPRESSION: Moderately severe generalized atrophy and chronic microvascular disease. Chronic lacunar infarctions in the basal ganglia. No acute intracranial findings.  Chronic right maxillary sinus opacification.   Electronically Signed   By: Ellery Plunk M.D.   On: 06/11/2015 21:11    EKG:   Orders placed or performed during the hospital encounter of 06/11/15  . ED EKG  . ED EKG  . EKG 12-Lead  . EKG 12-Lead      Management plans discussed with the patient, family and they are in agreement.  CODE STATUS:     Code Status Orders        Start     Ordered   06/11/15 2209  Full code   Continuous     06/11/15 2208      TOTAL TIME TAKING CARE OF THIS PATIENT: Reviewing medical records, imaging studies, follow-up exam, discharge summary  discharge orders and coordination of care is 45 minutes    @[facility] @  on 06/14/2015 at 3:07 PM  Between 7am to 6pm - Pager - [phone]  After 6pm go to [email or web address] - password [email or web address] [facility]  [place] [facility]  Office  [phone]  CC: Primary care physician; Tillman Abide, MD

## 2015-06-14 NOTE — Care Management (Addendum)
Home with [facility] vs [facility] today. Son will discuss with Valentina Gu, Clinical Social Worker. Son will pick-up Mr. Starliper at 6:00pm tonight. Will fax orders to [facility]. Gwenette Greet RN MSN Care Manager [phone]

## 2015-06-14 NOTE — Discharge Instructions (Signed)
Diet - diabetic Activity - as tolerated per home PT F/U WITH PCP in a week and neuro PRN

## 2015-06-15 LAB — URINE CULTURE: Culture: >=100000

## 2015-06-17 ENCOUNTER — Ambulatory Visit: Payer: Medicare Other | Admitting: Internal Medicine

## 2015-06-17 ENCOUNTER — Telehealth: Payer: Self-pay | Admitting: *Deleted

## 2015-06-17 ENCOUNTER — Telehealth: Payer: Self-pay | Admitting: Internal Medicine

## 2015-06-17 LAB — CULTURE, BLOOD (ROUTINE X 2)
CULTURE: NO GROWTH
CULTURE: NO GROWTH

## 2015-06-17 NOTE — Telephone Encounter (Signed)
Patient did not come in for their appointment today for TCM [facility] 6 mos follow up .  Please let me know if patient needs to be contacted immediately for follow up or no follow up needed.

## 2015-06-17 NOTE — Telephone Encounter (Signed)
Transitional care call attempted.  Left message for patient to return call. 

## 2015-06-17 NOTE — Telephone Encounter (Signed)
I have already canceled appt and tried contacting son

## 2015-06-18 DIAGNOSIS — R338 Other retention of urine: Secondary | ICD-10-CM | POA: Diagnosis not present

## 2015-06-18 DIAGNOSIS — E1122 Type 2 diabetes mellitus with diabetic chronic kidney disease: Secondary | ICD-10-CM | POA: Diagnosis not present

## 2015-06-18 DIAGNOSIS — F329 Major depressive disorder, single episode, unspecified: Secondary | ICD-10-CM | POA: Diagnosis not present

## 2015-06-18 DIAGNOSIS — L89150 Pressure ulcer of sacral region, unstageable: Secondary | ICD-10-CM | POA: Diagnosis not present

## 2015-06-18 DIAGNOSIS — Z466 Encounter for fitting and adjustment of urinary device: Secondary | ICD-10-CM | POA: Diagnosis not present

## 2015-06-18 DIAGNOSIS — N401 Enlarged prostate with lower urinary tract symptoms: Secondary | ICD-10-CM | POA: Diagnosis not present

## 2015-06-18 DIAGNOSIS — M6281 Muscle weakness (generalized): Secondary | ICD-10-CM | POA: Diagnosis not present

## 2015-06-18 DIAGNOSIS — E43 Unspecified severe protein-calorie malnutrition: Secondary | ICD-10-CM | POA: Diagnosis not present

## 2015-06-18 DIAGNOSIS — I2510 Atherosclerotic heart disease of native coronary artery without angina pectoris: Secondary | ICD-10-CM | POA: Diagnosis not present

## 2015-06-18 DIAGNOSIS — N183 Chronic kidney disease, stage 3 (moderate): Secondary | ICD-10-CM | POA: Diagnosis not present

## 2015-06-18 DIAGNOSIS — Z7902 Long term (current) use of antithrombotics/antiplatelets: Secondary | ICD-10-CM | POA: Diagnosis not present

## 2015-06-18 DIAGNOSIS — I129 Hypertensive chronic kidney disease with stage 1 through stage 4 chronic kidney disease, or unspecified chronic kidney disease: Secondary | ICD-10-CM | POA: Diagnosis not present

## 2015-06-18 DIAGNOSIS — F0390 Unspecified dementia without behavioral disturbance: Secondary | ICD-10-CM | POA: Diagnosis not present

## 2015-06-18 DIAGNOSIS — Z9181 History of falling: Secondary | ICD-10-CM | POA: Diagnosis not present

## 2015-06-18 DIAGNOSIS — I252 Old myocardial infarction: Secondary | ICD-10-CM | POA: Diagnosis not present

## 2015-06-18 DIAGNOSIS — Z7982 Long term (current) use of aspirin: Secondary | ICD-10-CM | POA: Diagnosis not present

## 2015-06-18 DIAGNOSIS — R2681 Unsteadiness on feet: Secondary | ICD-10-CM | POA: Diagnosis not present

## 2015-06-18 DIAGNOSIS — I5042 Chronic combined systolic (congestive) and diastolic (congestive) heart failure: Secondary | ICD-10-CM | POA: Diagnosis not present

## 2015-06-18 NOTE — Telephone Encounter (Signed)
Okay---I had put him on I may be able to make it out in the next few weeks

## 2015-06-18 NOTE — Telephone Encounter (Signed)
Spoke with son and he would like his dad added to your home visit list.

## 2015-06-19 DIAGNOSIS — I2510 Atherosclerotic heart disease of native coronary artery without angina pectoris: Secondary | ICD-10-CM | POA: Diagnosis not present

## 2015-06-19 DIAGNOSIS — I5042 Chronic combined systolic (congestive) and diastolic (congestive) heart failure: Secondary | ICD-10-CM | POA: Diagnosis not present

## 2015-06-19 DIAGNOSIS — M6281 Muscle weakness (generalized): Secondary | ICD-10-CM | POA: Diagnosis not present

## 2015-06-19 DIAGNOSIS — N401 Enlarged prostate with lower urinary tract symptoms: Secondary | ICD-10-CM | POA: Diagnosis not present

## 2015-06-19 DIAGNOSIS — R338 Other retention of urine: Secondary | ICD-10-CM | POA: Diagnosis not present

## 2015-06-19 DIAGNOSIS — I129 Hypertensive chronic kidney disease with stage 1 through stage 4 chronic kidney disease, or unspecified chronic kidney disease: Secondary | ICD-10-CM | POA: Diagnosis not present

## 2015-06-19 DIAGNOSIS — Z7982 Long term (current) use of aspirin: Secondary | ICD-10-CM | POA: Diagnosis not present

## 2015-06-19 DIAGNOSIS — I252 Old myocardial infarction: Secondary | ICD-10-CM | POA: Diagnosis not present

## 2015-06-19 DIAGNOSIS — N183 Chronic kidney disease, stage 3 (moderate): Secondary | ICD-10-CM | POA: Diagnosis not present

## 2015-06-19 DIAGNOSIS — L89150 Pressure ulcer of sacral region, unstageable: Secondary | ICD-10-CM | POA: Diagnosis not present

## 2015-06-19 DIAGNOSIS — E1122 Type 2 diabetes mellitus with diabetic chronic kidney disease: Secondary | ICD-10-CM | POA: Diagnosis not present

## 2015-06-19 DIAGNOSIS — R2681 Unsteadiness on feet: Secondary | ICD-10-CM | POA: Diagnosis not present

## 2015-06-19 DIAGNOSIS — E43 Unspecified severe protein-calorie malnutrition: Secondary | ICD-10-CM | POA: Diagnosis not present

## 2015-06-19 DIAGNOSIS — F0390 Unspecified dementia without behavioral disturbance: Secondary | ICD-10-CM | POA: Diagnosis not present

## 2015-06-19 DIAGNOSIS — F329 Major depressive disorder, single episode, unspecified: Secondary | ICD-10-CM | POA: Diagnosis not present

## 2015-06-19 DIAGNOSIS — Z9181 History of falling: Secondary | ICD-10-CM | POA: Diagnosis not present

## 2015-06-19 DIAGNOSIS — Z7902 Long term (current) use of antithrombotics/antiplatelets: Secondary | ICD-10-CM | POA: Diagnosis not present

## 2015-06-19 DIAGNOSIS — Z466 Encounter for fitting and adjustment of urinary device: Secondary | ICD-10-CM | POA: Diagnosis not present

## 2015-06-21 DIAGNOSIS — Z7982 Long term (current) use of aspirin: Secondary | ICD-10-CM | POA: Diagnosis not present

## 2015-06-21 DIAGNOSIS — E43 Unspecified severe protein-calorie malnutrition: Secondary | ICD-10-CM | POA: Diagnosis not present

## 2015-06-21 DIAGNOSIS — E1122 Type 2 diabetes mellitus with diabetic chronic kidney disease: Secondary | ICD-10-CM | POA: Diagnosis not present

## 2015-06-21 DIAGNOSIS — I129 Hypertensive chronic kidney disease with stage 1 through stage 4 chronic kidney disease, or unspecified chronic kidney disease: Secondary | ICD-10-CM | POA: Diagnosis not present

## 2015-06-21 DIAGNOSIS — Z7902 Long term (current) use of antithrombotics/antiplatelets: Secondary | ICD-10-CM | POA: Diagnosis not present

## 2015-06-21 DIAGNOSIS — I2510 Atherosclerotic heart disease of native coronary artery without angina pectoris: Secondary | ICD-10-CM | POA: Diagnosis not present

## 2015-06-21 DIAGNOSIS — I252 Old myocardial infarction: Secondary | ICD-10-CM | POA: Diagnosis not present

## 2015-06-21 DIAGNOSIS — L89150 Pressure ulcer of sacral region, unstageable: Secondary | ICD-10-CM | POA: Diagnosis not present

## 2015-06-21 DIAGNOSIS — F329 Major depressive disorder, single episode, unspecified: Secondary | ICD-10-CM | POA: Diagnosis not present

## 2015-06-21 DIAGNOSIS — N183 Chronic kidney disease, stage 3 (moderate): Secondary | ICD-10-CM | POA: Diagnosis not present

## 2015-06-21 DIAGNOSIS — N401 Enlarged prostate with lower urinary tract symptoms: Secondary | ICD-10-CM | POA: Diagnosis not present

## 2015-06-21 DIAGNOSIS — F0390 Unspecified dementia without behavioral disturbance: Secondary | ICD-10-CM | POA: Diagnosis not present

## 2015-06-21 DIAGNOSIS — R2681 Unsteadiness on feet: Secondary | ICD-10-CM | POA: Diagnosis not present

## 2015-06-21 DIAGNOSIS — Z466 Encounter for fitting and adjustment of urinary device: Secondary | ICD-10-CM | POA: Diagnosis not present

## 2015-06-21 DIAGNOSIS — Z9181 History of falling: Secondary | ICD-10-CM | POA: Diagnosis not present

## 2015-06-21 DIAGNOSIS — M6281 Muscle weakness (generalized): Secondary | ICD-10-CM | POA: Diagnosis not present

## 2015-06-21 DIAGNOSIS — I5042 Chronic combined systolic (congestive) and diastolic (congestive) heart failure: Secondary | ICD-10-CM | POA: Diagnosis not present

## 2015-06-21 DIAGNOSIS — R338 Other retention of urine: Secondary | ICD-10-CM | POA: Diagnosis not present

## 2015-06-24 DIAGNOSIS — R338 Other retention of urine: Secondary | ICD-10-CM | POA: Diagnosis not present

## 2015-06-24 DIAGNOSIS — I252 Old myocardial infarction: Secondary | ICD-10-CM | POA: Diagnosis not present

## 2015-06-24 DIAGNOSIS — Z7982 Long term (current) use of aspirin: Secondary | ICD-10-CM | POA: Diagnosis not present

## 2015-06-24 DIAGNOSIS — I129 Hypertensive chronic kidney disease with stage 1 through stage 4 chronic kidney disease, or unspecified chronic kidney disease: Secondary | ICD-10-CM | POA: Diagnosis not present

## 2015-06-24 DIAGNOSIS — Z7902 Long term (current) use of antithrombotics/antiplatelets: Secondary | ICD-10-CM | POA: Diagnosis not present

## 2015-06-24 DIAGNOSIS — F329 Major depressive disorder, single episode, unspecified: Secondary | ICD-10-CM | POA: Diagnosis not present

## 2015-06-24 DIAGNOSIS — N183 Chronic kidney disease, stage 3 (moderate): Secondary | ICD-10-CM | POA: Diagnosis not present

## 2015-06-24 DIAGNOSIS — Z466 Encounter for fitting and adjustment of urinary device: Secondary | ICD-10-CM | POA: Diagnosis not present

## 2015-06-24 DIAGNOSIS — E43 Unspecified severe protein-calorie malnutrition: Secondary | ICD-10-CM | POA: Diagnosis not present

## 2015-06-24 DIAGNOSIS — L89150 Pressure ulcer of sacral region, unstageable: Secondary | ICD-10-CM | POA: Diagnosis not present

## 2015-06-24 DIAGNOSIS — F0390 Unspecified dementia without behavioral disturbance: Secondary | ICD-10-CM | POA: Diagnosis not present

## 2015-06-24 DIAGNOSIS — R2681 Unsteadiness on feet: Secondary | ICD-10-CM | POA: Diagnosis not present

## 2015-06-24 DIAGNOSIS — I2510 Atherosclerotic heart disease of native coronary artery without angina pectoris: Secondary | ICD-10-CM | POA: Diagnosis not present

## 2015-06-24 DIAGNOSIS — Z9181 History of falling: Secondary | ICD-10-CM | POA: Diagnosis not present

## 2015-06-24 DIAGNOSIS — E1122 Type 2 diabetes mellitus with diabetic chronic kidney disease: Secondary | ICD-10-CM | POA: Diagnosis not present

## 2015-06-24 DIAGNOSIS — I5042 Chronic combined systolic (congestive) and diastolic (congestive) heart failure: Secondary | ICD-10-CM | POA: Diagnosis not present

## 2015-06-24 DIAGNOSIS — N401 Enlarged prostate with lower urinary tract symptoms: Secondary | ICD-10-CM | POA: Diagnosis not present

## 2015-06-24 DIAGNOSIS — M6281 Muscle weakness (generalized): Secondary | ICD-10-CM | POA: Diagnosis not present

## 2015-06-26 ENCOUNTER — Emergency Department: Payer: Medicare Other

## 2015-06-26 ENCOUNTER — Encounter: Payer: Self-pay | Admitting: Emergency Medicine

## 2015-06-26 ENCOUNTER — Inpatient Hospital Stay
Admission: EM | Admit: 2015-06-26 | Discharge: 2015-06-30 | DRG: 388 | Disposition: A | Discharge status: Home / Self Care | Payer: Medicare Other | Attending: Internal Medicine | Admitting: Internal Medicine

## 2015-06-26 DIAGNOSIS — I252 Old myocardial infarction: Secondary | ICD-10-CM

## 2015-06-26 DIAGNOSIS — N390 Urinary tract infection, site not specified: Secondary | ICD-10-CM | POA: Diagnosis not present

## 2015-06-26 DIAGNOSIS — I129 Hypertensive chronic kidney disease with stage 1 through stage 4 chronic kidney disease, or unspecified chronic kidney disease: Secondary | ICD-10-CM | POA: Diagnosis not present

## 2015-06-26 DIAGNOSIS — Z7982 Long term (current) use of aspirin: Secondary | ICD-10-CM

## 2015-06-26 DIAGNOSIS — L98429 Non-pressure chronic ulcer of back with unspecified severity: Secondary | ICD-10-CM | POA: Diagnosis present

## 2015-06-26 DIAGNOSIS — R338 Other retention of urine: Secondary | ICD-10-CM | POA: Diagnosis not present

## 2015-06-26 DIAGNOSIS — K5641 Fecal impaction: Principal | ICD-10-CM | POA: Diagnosis present

## 2015-06-26 DIAGNOSIS — N401 Enlarged prostate with lower urinary tract symptoms: Secondary | ICD-10-CM | POA: Diagnosis present

## 2015-06-26 DIAGNOSIS — Z7902 Long term (current) use of antithrombotics/antiplatelets: Secondary | ICD-10-CM

## 2015-06-26 DIAGNOSIS — Z9181 History of falling: Secondary | ICD-10-CM | POA: Diagnosis not present

## 2015-06-26 DIAGNOSIS — N138 Other obstructive and reflux uropathy: Secondary | ICD-10-CM | POA: Diagnosis not present

## 2015-06-26 DIAGNOSIS — E119 Type 2 diabetes mellitus without complications: Secondary | ICD-10-CM | POA: Diagnosis not present

## 2015-06-26 DIAGNOSIS — E1129 Type 2 diabetes mellitus with other diabetic kidney complication: Secondary | ICD-10-CM | POA: Diagnosis present

## 2015-06-26 DIAGNOSIS — J9601 Acute respiratory failure with hypoxia: Secondary | ICD-10-CM | POA: Diagnosis not present

## 2015-06-26 DIAGNOSIS — K589 Irritable bowel syndrome without diarrhea: Secondary | ICD-10-CM | POA: Diagnosis present

## 2015-06-26 DIAGNOSIS — I77811 Abdominal aortic ectasia: Secondary | ICD-10-CM | POA: Diagnosis not present

## 2015-06-26 DIAGNOSIS — K649 Unspecified hemorrhoids: Secondary | ICD-10-CM | POA: Diagnosis present

## 2015-06-26 DIAGNOSIS — K5790 Diverticulosis of intestine, part unspecified, without perforation or abscess without bleeding: Secondary | ICD-10-CM | POA: Diagnosis present

## 2015-06-26 DIAGNOSIS — I25119 Atherosclerotic heart disease of native coronary artery with unspecified angina pectoris: Secondary | ICD-10-CM | POA: Diagnosis present

## 2015-06-26 DIAGNOSIS — R0602 Shortness of breath: Secondary | ICD-10-CM | POA: Diagnosis not present

## 2015-06-26 DIAGNOSIS — I213 ST elevation (STEMI) myocardial infarction of unspecified site: Secondary | ICD-10-CM | POA: Diagnosis not present

## 2015-06-26 DIAGNOSIS — I248 Other forms of acute ischemic heart disease: Secondary | ICD-10-CM | POA: Insufficient documentation

## 2015-06-26 DIAGNOSIS — Z833 Family history of diabetes mellitus: Secondary | ICD-10-CM

## 2015-06-26 DIAGNOSIS — M1990 Unspecified osteoarthritis, unspecified site: Secondary | ICD-10-CM | POA: Diagnosis not present

## 2015-06-26 DIAGNOSIS — I5021 Acute systolic (congestive) heart failure: Secondary | ICD-10-CM | POA: Diagnosis not present

## 2015-06-26 DIAGNOSIS — E1122 Type 2 diabetes mellitus with diabetic chronic kidney disease: Secondary | ICD-10-CM | POA: Diagnosis present

## 2015-06-26 DIAGNOSIS — I2510 Atherosclerotic heart disease of native coronary artery without angina pectoris: Secondary | ICD-10-CM | POA: Diagnosis present

## 2015-06-26 DIAGNOSIS — H9190 Unspecified hearing loss, unspecified ear: Secondary | ICD-10-CM | POA: Diagnosis present

## 2015-06-26 DIAGNOSIS — Z681 Body mass index (BMI) 19 or less, adult: Secondary | ICD-10-CM

## 2015-06-26 DIAGNOSIS — Z950 Presence of cardiac pacemaker: Secondary | ICD-10-CM

## 2015-06-26 DIAGNOSIS — E11649 Type 2 diabetes mellitus with hypoglycemia without coma: Secondary | ICD-10-CM | POA: Diagnosis not present

## 2015-06-26 DIAGNOSIS — F329 Major depressive disorder, single episode, unspecified: Secondary | ICD-10-CM | POA: Diagnosis not present

## 2015-06-26 DIAGNOSIS — N183 Chronic kidney disease, stage 3 (moderate): Secondary | ICD-10-CM | POA: Diagnosis not present

## 2015-06-26 DIAGNOSIS — E440 Moderate protein-calorie malnutrition: Secondary | ICD-10-CM | POA: Diagnosis not present

## 2015-06-26 DIAGNOSIS — T83511A Infection and inflammatory reaction due to indwelling urethral catheter, initial encounter: Secondary | ICD-10-CM

## 2015-06-26 DIAGNOSIS — I5042 Chronic combined systolic (congestive) and diastolic (congestive) heart failure: Secondary | ICD-10-CM | POA: Diagnosis not present

## 2015-06-26 DIAGNOSIS — E43 Unspecified severe protein-calorie malnutrition: Secondary | ICD-10-CM | POA: Diagnosis not present

## 2015-06-26 DIAGNOSIS — L89152 Pressure ulcer of sacral region, stage 2: Secondary | ICD-10-CM | POA: Diagnosis present

## 2015-06-26 DIAGNOSIS — I10 Essential (primary) hypertension: Secondary | ICD-10-CM | POA: Diagnosis present

## 2015-06-26 DIAGNOSIS — Z9861 Coronary angioplasty status: Secondary | ICD-10-CM

## 2015-06-26 DIAGNOSIS — K5730 Diverticulosis of large intestine without perforation or abscess without bleeding: Secondary | ICD-10-CM | POA: Diagnosis not present

## 2015-06-26 DIAGNOSIS — R112 Nausea with vomiting, unspecified: Secondary | ICD-10-CM | POA: Diagnosis not present

## 2015-06-26 DIAGNOSIS — E785 Hyperlipidemia, unspecified: Secondary | ICD-10-CM | POA: Diagnosis present

## 2015-06-26 DIAGNOSIS — I214 Non-ST elevation (NSTEMI) myocardial infarction: Secondary | ICD-10-CM | POA: Diagnosis not present

## 2015-06-26 DIAGNOSIS — K219 Gastro-esophageal reflux disease without esophagitis: Secondary | ICD-10-CM | POA: Diagnosis not present

## 2015-06-26 DIAGNOSIS — M6281 Muscle weakness (generalized): Secondary | ICD-10-CM | POA: Diagnosis not present

## 2015-06-26 DIAGNOSIS — R2681 Unsteadiness on feet: Secondary | ICD-10-CM | POA: Diagnosis not present

## 2015-06-26 DIAGNOSIS — R0789 Other chest pain: Secondary | ICD-10-CM | POA: Diagnosis not present

## 2015-06-26 DIAGNOSIS — R109 Unspecified abdominal pain: Secondary | ICD-10-CM | POA: Diagnosis not present

## 2015-06-26 DIAGNOSIS — I5031 Acute diastolic (congestive) heart failure: Secondary | ICD-10-CM | POA: Diagnosis not present

## 2015-06-26 DIAGNOSIS — F0390 Unspecified dementia without behavioral disturbance: Secondary | ICD-10-CM | POA: Diagnosis not present

## 2015-06-26 DIAGNOSIS — Z8673 Personal history of transient ischemic attack (TIA), and cerebral infarction without residual deficits: Secondary | ICD-10-CM

## 2015-06-26 DIAGNOSIS — R1110 Vomiting, unspecified: Secondary | ICD-10-CM | POA: Diagnosis not present

## 2015-06-26 DIAGNOSIS — L89150 Pressure ulcer of sacral region, unstageable: Secondary | ICD-10-CM | POA: Diagnosis not present

## 2015-06-26 DIAGNOSIS — Z87891 Personal history of nicotine dependence: Secondary | ICD-10-CM

## 2015-06-26 DIAGNOSIS — Z466 Encounter for fitting and adjustment of urinary device: Secondary | ICD-10-CM | POA: Diagnosis not present

## 2015-06-26 DIAGNOSIS — J811 Chronic pulmonary edema: Secondary | ICD-10-CM | POA: Diagnosis present

## 2015-06-26 DIAGNOSIS — T8351XA Infection and inflammatory reaction due to indwelling urinary catheter, initial encounter: Secondary | ICD-10-CM | POA: Diagnosis not present

## 2015-06-26 HISTORY — DX: Atrioventricular block, complete: I44.2

## 2015-06-26 LAB — COMPREHENSIVE METABOLIC PANEL
ALK PHOS: 86 U/L (ref 38–126)
ALT: 12 U/L — ABNORMAL LOW (ref 17–63)
AST: 26 U/L (ref 15–41)
Albumin: 3.9 g/dL (ref 3.5–5.0)
Anion gap: 13 (ref 5–15)
BILIRUBIN TOTAL: 0.7 mg/dL (ref 0.3–1.2)
BUN: 32 mg/dL — ABNORMAL HIGH (ref 6–20)
CALCIUM: 9.3 mg/dL (ref 8.9–10.3)
CO2: 23 mmol/L (ref 22–32)
CREATININE: 1.69 mg/dL — AB (ref 0.61–1.24)
Chloride: 100 mmol/L — ABNORMAL LOW (ref 101–111)
GFR calc non Af Amer: 35 mL/min — ABNORMAL LOW (ref >60)
GFR, EST AFRICAN AMERICAN: 40 mL/min — AB (ref >60)
Glucose, Bld: 178 mg/dL — ABNORMAL HIGH (ref 65–99)
Potassium: 4.6 mmol/L (ref 3.5–5.1)
SODIUM: 136 mmol/L (ref 135–145)
Total Protein: 7.7 g/dL (ref 6.5–8.1)

## 2015-06-26 LAB — CBC
HCT: 33.8 % — ABNORMAL LOW (ref 40.0–52.0)
Hemoglobin: 10.8 g/dL — ABNORMAL LOW (ref 13.0–18.0)
MCH: 31 pg (ref 26.0–34.0)
MCHC: 31.9 g/dL — AB (ref 32.0–36.0)
MCV: 96.9 fL (ref 80.0–100.0)
PLATELETS: 204 10*3/uL (ref 150–440)
RBC: 3.49 MIL/uL — AB (ref 4.40–5.90)
RDW: 15.2 % — ABNORMAL HIGH (ref 11.5–14.5)
WBC: 14.7 10*3/uL — ABNORMAL HIGH (ref 3.8–10.6)

## 2015-06-26 LAB — URINALYSIS COMPLETE WITH MICROSCOPIC (ARMC ONLY)
Bilirubin Urine: NEGATIVE
GLUCOSE, UA: 50 mg/dL — AB
Nitrite: NEGATIVE
Protein, ur: 100 mg/dL — AB
Specific Gravity, Urine: 1.018 (ref 1.005–1.030)
pH: 5 (ref 5.0–8.0)

## 2015-06-26 LAB — LIPASE, BLOOD: Lipase: 17 U/L — ABNORMAL LOW (ref 22–51)

## 2015-06-26 MED ORDER — SODIUM CHLORIDE 0.9 % IV BOLUS (SEPSIS)
1000.0000 mL | Freq: Once | INTRAVENOUS | Status: AC
Start: 2015-06-26 — End: 2015-06-26
  Administered 2015-06-26: 1000 mL via INTRAVENOUS

## 2015-06-26 MED ORDER — CIPROFLOXACIN IN D5W 400 MG/200ML IV SOLN
400.0000 mg | Freq: Once | INTRAVENOUS | Status: AC
  Administered 2015-06-26: 400 mg via INTRAVENOUS
  Filled 2015-06-26: qty 200

## 2015-06-26 MED ORDER — ACETAMINOPHEN 500 MG PO TABS
1000.0000 mg | ORAL_TABLET | ORAL | Status: AC
  Administered 2015-06-26: 1000 mg via ORAL
  Filled 2015-06-26: qty 2

## 2015-06-26 MED ORDER — IOHEXOL 240 MG/ML SOLN
50.0000 mL | Freq: Once | INTRAMUSCULAR | Status: AC | PRN
  Administered 2015-06-26: 50 mL via ORAL

## 2015-06-26 MED ORDER — MORPHINE SULFATE (PF) 2 MG/ML IV SOLN
INTRAVENOUS | Status: AC
  Administered 2015-06-26: 2 mg via INTRAVENOUS
  Filled 2015-06-26: qty 1

## 2015-06-26 MED ORDER — ONDANSETRON HCL 4 MG/2ML IJ SOLN
4.0000 mg | INTRAMUSCULAR | Status: AC
  Administered 2015-06-26: 4 mg via INTRAVENOUS
  Filled 2015-06-26: qty 2

## 2015-06-26 MED ORDER — MORPHINE SULFATE (PF) 2 MG/ML IV SOLN
2.0000 mg | Freq: Once | INTRAVENOUS | Status: AC
  Administered 2015-06-26: 2 mg via INTRAVENOUS

## 2015-06-26 NOTE — ED Notes (Signed)
Patient transported to CT 

## 2015-06-26 NOTE — ED Notes (Signed)
Patient to ED with report of possible constipation, nausea and vomiting since last night.

## 2015-06-26 NOTE — H&P (Signed)
[facility] [place] [facility]   PATIENT NAME: Andres [name]    MR#:  [id number]  DATE OF BIRTH:  04/29/1926  DATE OF ADMISSION:  06/26/2015  PRIMARY CARE PHYSICIAN: Tillman Abide, MD   REQUESTING/REFERRING PHYSICIAN: Fanny Bien, M.D.  CHIEF COMPLAINT:   Chief Complaint  Patient presents with  . Emesis    HISTORY OF PRESENT ILLNESS:  Andres [name]  is a 81 y.o. male who presents with nausea and vomiting for the last 2 days as well as no bowel movement for the past week. Patient is very hard of hearing, and is unable to give much history. Collateral history is taken from ED physician who spoke with family. Patient was found to have fecal impaction the ED, and attempt was made to disimpact and it was unsuccessful. Administration of an enema also did not resolve the patient's impaction. Patient also has a chronic indwelling Foley catheter for BPH with urinary retention. He was noted to have a UTI on labs in the ED here today. Hospitalists were called for admission for fecal impaction as well as UTI.  PAST MEDICAL HISTORY:   Past Medical History  Diagnosis Date  . CAD (coronary artery disease)   . Diverticulitis   . GERD (gastroesophageal reflux disease)   . Hyperlipidemia   . Hypertension   . BPH (benign prostatic hypertrophy)   . Diabetes mellitus   . DJD (degenerative joint disease)     lumbar spine  . IBS (irritable bowel syndrome)   . Stroke   . Depression 2012  . Acute MI 09/2012  . Chronic kidney disease     ACUTE RENAL FAILURE  . Foley catheter in place 05/09/15    for 1 year per family    PAST SURGICAL HISTORY:   Past Surgical History  Procedure Laterality Date  . Angioplasty  1995  . Doppler echocardiography  11/1999    syncope  . Pacemaker insertion      Pacer/cath  (ef 65%)  1/01 -  guidant Discovery II DR pulse generator  . Prostate surgery  07/2003  . Esophagogastroduodenoscopy  07/2005    neg  . Cardiac catheterization   09/2012    NSTEMI/STENT PLACED  . Insert / replace / remove pacemaker      [id number]    SOCIAL HISTORY:   Social History  Substance Use Topics  . Smoking status: Former Smoker    Types: Cigarettes  . Smokeless tobacco: Never Used  . Alcohol Use: No     Comment: heavy in the past    FAMILY HISTORY:   Family History  Problem Relation Age of Onset  . Diabetes Sister     DRUG ALLERGIES:  No Known Allergies  MEDICATIONS AT HOME:   Prior to Admission medications   Medication Sig Start Date End Date Taking? Authorizing Provider  acetaminophen (TYLENOL) 325 MG tablet Take 2 tablets (650 mg total) by mouth every 6 (six) hours as needed for mild pain (or Fever >/= 101). 06/14/15  Yes Ramonita Lab, MD  aspirin EC 81 MG tablet Take 81 mg by mouth daily.   Yes Historical Provider, MD  atorvastatin (LIPITOR) 10 MG tablet Take 10 mg by mouth daily.    Yes Historical Provider, MD  citalopram (CELEXA) 10 MG tablet Take 10 mg by mouth daily.   Yes Historical Provider, MD  clopidogrel (PLAVIX) 75 MG tablet Take 75 mg by mouth daily.   Yes Historical Provider, MD  docusate sodium (COLACE) 100 MG capsule  Take 100 mg by mouth 2 (two) times daily as needed for mild constipation.    Yes Historical Provider, MD  feeding supplement, ENSURE ENLIVE, (ENSURE ENLIVE) LIQD Take 237 mLs by mouth 3 (three) times daily with meals. 06/14/15  Yes Ramonita Lab, MD  finasteride (PROSCAR) 5 MG tablet Take 5 mg by mouth daily.   Yes Historical Provider, MD  fluticasone (FLONASE) 50 MCG/ACT nasal spray Place 2 sprays into the nose daily. Patient taking differently: Place 2 sprays into the nose daily as needed for rhinitis.  05/10/13  Yes Karie Schwalbe, MD  glipiZIDE (GLUCOTROL XL) 5 MG 24 hr tablet Take 5 mg by mouth daily.   Yes Historical Provider, MD  metFORMIN (GLUCOPHAGE) 1000 MG tablet Take 500 mg by mouth 2 (two) times daily.   Yes Historical Provider, MD  metoprolol tartrate (LOPRESSOR) 25 MG tablet Take 0.5  tablets (12.5 mg total) by mouth 2 (two) times daily. Patient taking differently: Take 25 mg by mouth 2 (two) times daily.  05/14/15  Yes Srikar Sudini, MD  Multiple Vitamin (MULTIVITAMIN WITH MINERALS) TABS tablet Take 1 tablet by mouth daily. 05/10/15  Yes Srikar Sudini, MD  nitroGLYCERIN (NITROSTAT) 0.4 MG SL tablet Place 0.4 mg under the tongue every 5 (five) minutes as needed for chest pain.    Yes Historical Provider, MD  saccharomyces boulardii (FLORASTOR) 250 MG capsule Take 1 capsule (250 mg total) by mouth 2 (two) times daily. 06/14/15  Yes Ramonita Lab, MD  triamcinolone cream (KENALOG) 0.1 % Apply 1 application topically 2 (two) times daily as needed. For itchy areas Patient taking differently: Apply 1 application topically 2 (two) times daily as needed (for dry skin).  10/26/13  Yes Karie Schwalbe, MD  albuterol (PROVENTIL) (2.5 MG/3ML) 0.083% nebulizer solution Take 3 mLs (2.5 mg total) by nebulization every 2 (two) hours as needed for wheezing. Patient not taking: Reported on 06/11/2015 05/10/15   Milagros Loll, MD  cephALEXin (KEFLEX) 500 MG capsule Take 1 capsule (500 mg total) by mouth every 12 (twelve) hours. Patient not taking: Reported on 06/26/2015 06/14/15   Ramonita Lab, MD    REVIEW OF SYSTEMS:  Review of Systems  Constitutional: Negative for fever, chills, weight loss and malaise/fatigue.  HENT: Negative for ear pain, hearing loss and tinnitus.   Eyes: Negative for blurred vision, double vision, pain and redness.  Respiratory: Negative for cough, hemoptysis and shortness of breath.   Cardiovascular: Negative for chest pain, palpitations, orthopnea and leg swelling.  Gastrointestinal: Positive for nausea, vomiting and abdominal pain. Negative for diarrhea and constipation.  Genitourinary: Negative for dysuria, frequency and hematuria.  Musculoskeletal: Negative for back pain, joint pain and neck pain.  Skin:       No acne, rash, or lesions  Neurological: Negative for dizziness,  tremors, focal weakness and weakness.  Endo/Heme/Allergies: Negative for polydipsia. Does not bruise/bleed easily.  Psychiatric/Behavioral: Negative for depression. The patient is not nervous/anxious and does not have insomnia.      VITAL SIGNS:   Filed Vitals:   06/26/15 1321 06/26/15 1759 06/26/15 2128  BP: 114/78 113/63 101/50  Pulse: 73 71 64  Temp: 97.8 F (36.6 C)    TempSrc: Oral    Resp: 18 20 16   Height: 5\' 8"  (1.727 m)    Weight: 54.432 kg (120 lb)    SpO2: 10% 93% 93%   Wt Readings from Last 3 Encounters:  06/26/15 54.432 kg (120 lb)  06/11/15 53.207 kg (117 lb 4.8 oz)  05/14/15 55.021 kg (121 lb 4.8 oz)    PHYSICAL EXAMINATION:  Physical Exam  Vitals reviewed. Constitutional: He appears well-developed and well-nourished. No distress.  HENT:  Head: Normocephalic and atraumatic.  Mouth/Throat: Oropharynx is clear and moist.  Extremely hard of hearing  Eyes: Conjunctivae and EOM are normal. Pupils are equal, round, and reactive to light. No scleral icterus.  Neck: Normal range of motion. Neck supple. No JVD present. No thyromegaly present.  Cardiovascular: Normal rate, regular rhythm and intact distal pulses.  Exam reveals no gallop and no friction rub.   No murmur heard. Respiratory: Effort normal and breath sounds normal. No respiratory distress. He has no wheezes. He has no rales.  GI: Soft. He exhibits no distension. There is no tenderness.  Hyperactive bowel sounds  Musculoskeletal: Normal range of motion. He exhibits no edema.  No arthritis, no gout  Lymphadenopathy:    He has no cervical adenopathy.  Neurological: He is alert. No cranial nerve deficit.  Orientation is difficult to assess as patient is very hard of hearing.  No dysarthria, no aphasia  Skin: Skin is warm and dry. No rash noted. No erythema.  Psychiatric:  Difficult to assess as patient is hard of hearing    LABORATORY PANEL:   CBC  Recent Labs Lab 06/26/15 1328  WBC 14.7*  HGB  10.8*  HCT 33.8*  PLT 204   ------------------------------------------------------------------------------------------------------------------  Chemistries   Recent Labs Lab 06/26/15 1328  NA 136  K 4.6  CL 100*  CO2 23  GLUCOSE 178*  BUN 32*  CREATININE 1.69*  CALCIUM 9.3  AST 26  ALT 12*  ALKPHOS 86  BILITOT 0.7   ------------------------------------------------------------------------------------------------------------------  Cardiac Enzymes No results for input(s): TROPONINI in the last 168 hours. ------------------------------------------------------------------------------------------------------------------  RADIOLOGY:  Ct Abdomen Pelvis Wo Contrast  06/26/2015   CLINICAL DATA:  Constipation, nausea, vomiting since last night  EXAM: CT ABDOMEN AND PELVIS WITHOUT CONTRAST  TECHNIQUE: Multidetector CT imaging of the abdomen and pelvis was performed following the standard protocol without IV contrast.  COMPARISON:  None.  FINDINGS: Lower chest: Bilateral calcified pleural plaques. Small right pleural effusion. Right basilar atelectasis. Small right pericardial calcification.  Hepatobiliary: Normal liver. Punctate calcification in the left hepatic lobe likely reflecting sequela prior granulomatous disease. Prior cholecystectomy.  Pancreas: Normal.  Spleen: Normal.  Adrenals/Urinary Tract: Normal adrenal glands. 2 hypodense, fluid attenuating right renal mass is most consistent with cysts with the largest measuring 19 mm. No obstructive uropathy. Bilateral renal vascular calcifications. Decompressed bladder with a Foley catheter present.  Stomach/Bowel: Mild distension of the stomach. No bowel obstruction. Rectal fecal impaction. Scattered diverticular disease without evidence acute diverticulitis. No abdominal or pelvic free fluid. No pneumatosis, pneumoperitoneum or portal venous gas.  Vascular/Lymphatic: Infrarenal abdominal aortic ectasia measuring 2.7 cm in AP diameter.  Abdominal aortic atherosclerosis. No abdominal or pelvic lymphadenopathy.  Other: No fluid collection or hematoma.  Musculoskeletal: No lytic or sclerotic osseous lesion. No acute osseous abnormality. Bilateral facet arthropathy throughout the lumbar spine.  IMPRESSION: 1. Rectal fecal impaction. 2. Diverticulosis without evidence of diverticulitis. 3. Infrarenal abdominal aortic ectasia. Ectatic abdominal aorta at risk for aneurysm development. Recommend followup by ultrasound in 5 years. This recommendation follows ACR consensus guidelines: White Paper of the ACR Incidental Findings Committee II on Vascular Findings. J Am Coll Radiol 2013; 10:789-794.   Electronically Signed   By: Elige Ko   On: 06/26/2015 18:57   Dg Abd 1 View  06/26/2015   CLINICAL DATA:  Abdominal pain and vomiting for 1 day.  EXAM: ABDOMEN - 1 VIEW  COMPARISON:  03/04/2013  FINDINGS: Prior cholecystectomy. Calcifications project over both renal shadows, shown on prior CT to represent vascular calcifications. Nonobstructive bowel gas pattern. No organomegaly or free air. No acute bony abnormality.  IMPRESSION: No acute findings.   Electronically Signed   By: Charlett Nose M.D.   On: 06/26/2015 13:59    EKG:   Orders placed or performed during the hospital encounter of 06/11/15  . ED EKG  . ED EKG  . EKG 12-Lead  . EKG 12-Lead  . EKG    IMPRESSION AND PLAN:  Principal Problem:   Fecal impaction - we'll get a GI consult for further recommendation as to whether the patient needs colonoscopic fecal disimpaction. Active Problems:   UTI (lower urinary tract infection) - patient was started on IV antibiotics in the ED, we will continue these while inpatient   Type 2 diabetes mellitus with renal manifestations, controlled - sliding scale insulin with appropriate fingerstick glucose checks and carb modified diet when eating, nothing by mouth for now   Essential hypertension - continue home meds for this   BPH with urinary  obstruction - continue home meds, continue Foley catheter   Sacral ulcer - wound team consult for recommendations   Coronary atherosclerosis of native coronary artery - continue home medications   GERD - PPI at home dose  All the records are reviewed and case discussed with ED provider. Management plans discussed with the patient and/or family.  DVT PROPHYLAXIS: SubQ heparin  ADMISSION STATUS: Observation  CODE STATUS: Full  TOTAL TIME TAKING CARE OF THIS PATIENT: 40 minutes.    Allex Lapoint FIELDING 06/26/2015, 11:23 PM  [facility]  Office  [phone]  CC: Primary care physician; Tillman Abide, MD

## 2015-06-26 NOTE — ED Provider Notes (Signed)
[facility] Emergency Department Provider Note  ____________________________________________  Time seen: Approximately 4:13 PM  I have reviewed the triage vital signs and the nursing notes.   HISTORY  Chief Complaint Emesis  History is accommodation as reported by the patient and his son  HPI Andres [name] is a 81 y.o. male sure previous stroke, diabetes, encephalopathy, urinary tract infection with indwelling catheter, decubitus ulcer, chronic renal insufficiency who presents today with complaint of vomiting for the last 2 days and no bowel movement for approximately one week per his son.The patient reports for the last 2 days and vomited in the morning and has not been able to keep his medicine down well. He also reports he is having pain over his buttock, from an ulcer. He has not had a bowel movement and feels constipated and cramping across his abdomen.  No fevers or chills. No chest pain or trouble breathing. Does report that he has had no vision problems with the left eye since having a stroke, otherwise no deficits.   Past Medical History  Diagnosis Date  . CAD (coronary artery disease)   . Diverticulitis   . GERD (gastroesophageal reflux disease)   . Hyperlipidemia   . Hypertension   . BPH (benign prostatic hypertrophy)   . Diabetes mellitus   . DJD (degenerative joint disease)     lumbar spine  . IBS (irritable bowel syndrome)   . Stroke   . Depression 2012  . Acute MI 09/2012  . Chronic kidney disease     ACUTE RENAL FAILURE  . Foley catheter in place 05/09/15    for 1 year per family    Patient Active Problem List   Diagnosis Date Noted  . Pressure ulcer 06/12/2015  . Malnutrition of moderate degree 06/12/2015  . Encephalopathy 06/11/2015  . UTI (lower urinary tract infection) 06/11/2015  . Acute on chronic diastolic heart failure 05/14/2015  . Dementia 05/14/2015  . UTI (urinary tract infection) due to urinary indwelling catheter  05/10/2015  . Hyperkalemia 05/10/2015  . ARF (acute renal failure) 05/09/2015  . Protein-calorie malnutrition, severe 05/09/2015  . Preventative health care 12/17/2014  . Advance directive discussed with patient 12/17/2014  . Chronic retention of urine 04/30/2014  . Chronic kidney disease, stage III (moderate) 10/26/2013  . Pacemaker-Medtronic 06/20/2012  . Episodic mood disorder   . CVA 12/01/2010  . SEBORRHEIC DERMATITIS 04/24/2010  . DEGENERATIVE JOINT DISEASE, LUMBAR SPINE 04/18/2009  . AV BLOCK, COMPLETE 04/03/2009  . Late effects of CVA (cerebrovascular accident) 01/14/2009  . ACTINIC KERATOSIS 06/01/2008  . IRRITABLE BOWEL SYNDROME 05/19/2007  . Type 2 diabetes mellitus with renal manifestations, controlled 04/04/2007  . HYPERLIPIDEMIA 04/01/2007  . HYPERTENSION 04/01/2007  . Coronary atherosclerosis of native coronary artery 04/01/2007  . GERD 04/01/2007  . DIVERTICULOSIS, COLON 04/01/2007  . BPH with urinary obstruction 04/01/2007    Past Surgical History  Procedure Laterality Date  . Angioplasty  1995  . Doppler echocardiography  11/1999    syncope  . Pacemaker insertion      Pacer/cath  (ef 65%)  1/01 -  guidant Discovery II DR pulse generator  . Prostate surgery  07/2003  . Esophagogastroduodenoscopy  07/2005    neg  . Cardiac catheterization  09/2012    NSTEMI/STENT PLACED  . Insert / replace / remove pacemaker      [id number]    Current Outpatient Rx  Name  Route  Sig  Dispense  Refill  . acetaminophen (TYLENOL) 325 MG tablet  Oral   Take 2 tablets (650 mg total) by mouth every 6 (six) hours as needed for mild pain (or Fever >/= 101).         Marland Kitchen aspirin EC 81 MG tablet   Oral   Take 81 mg by mouth daily.         Marland Kitchen atorvastatin (LIPITOR) 10 MG tablet   Oral   Take 10 mg by mouth daily.          . citalopram (CELEXA) 10 MG tablet   Oral   Take 10 mg by mouth daily.         . clopidogrel (PLAVIX) 75 MG tablet   Oral   Take 75 mg by  mouth daily.         Marland Kitchen docusate sodium (COLACE) 100 MG capsule   Oral   Take 100 mg by mouth 2 (two) times daily as needed for mild constipation.          . feeding supplement, ENSURE ENLIVE, (ENSURE ENLIVE) LIQD   Oral   Take 237 mLs by mouth 3 (three) times daily with meals.   237 mL   12   . finasteride (PROSCAR) 5 MG tablet   Oral   Take 5 mg by mouth daily.         . fluticasone (FLONASE) 50 MCG/ACT nasal spray   Nasal   Place 2 sprays into the nose daily. Patient taking differently: Place 2 sprays into the nose daily as needed for rhinitis.    16 g   1   . glipiZIDE (GLUCOTROL XL) 5 MG 24 hr tablet   Oral   Take 5 mg by mouth daily.         . metFORMIN (GLUCOPHAGE) 1000 MG tablet   Oral   Take 500 mg by mouth 2 (two) times daily.         . metoprolol tartrate (LOPRESSOR) 25 MG tablet   Oral   Take 0.5 tablets (12.5 mg total) by mouth 2 (two) times daily. Patient taking differently: Take 25 mg by mouth 2 (two) times daily.    180 tablet   1   . Multiple Vitamin (MULTIVITAMIN WITH MINERALS) TABS tablet   Oral   Take 1 tablet by mouth daily.         . nitroGLYCERIN (NITROSTAT) 0.4 MG SL tablet   Sublingual   Place 0.4 mg under the tongue every 5 (five) minutes as needed for chest pain.          Marland Kitchen saccharomyces boulardii (FLORASTOR) 250 MG capsule   Oral   Take 1 capsule (250 mg total) by mouth 2 (two) times daily.   20 capsule   0   . triamcinolone cream (KENALOG) 0.1 %   Topical   Apply 1 application topically 2 (two) times daily as needed. For itchy areas Patient taking differently: Apply 1 application topically 2 (two) times daily as needed (for dry skin).    30 g   2   . albuterol (PROVENTIL) (2.5 MG/3ML) 0.083% nebulizer solution   Nebulization   Take 3 mLs (2.5 mg total) by nebulization every 2 (two) hours as needed for wheezing. Patient not taking: Reported on 06/11/2015   75 mL   12   . cephALEXin (KEFLEX) 500 MG capsule    Oral   Take 1 capsule (500 mg total) by mouth every 12 (twelve) hours. Patient not taking: Reported on 06/26/2015   10 capsule   0  Allergies Review of patient's allergies indicates no known allergies.  Family History  Problem Relation Age of Onset  . Diabetes Sister     Social History Social History  Substance Use Topics  . Smoking status: Former Smoker    Types: Cigarettes  . Smokeless tobacco: Never Used  . Alcohol Use: No     Comment: heavy in the past    Review of Systems Constitutional: No fever/chills Eyes: No visual changes. ENT: No sore throat. Cardiovascular: Denies chest pain. Respiratory: Denies shortness of breath. Gastrointestinal: Crampy pain across the abdomen, feels like he cannot bowel movement well.  No diarrhea.  No constipation. Genitourinary: Urine has appeared darker and clumpy. Musculoskeletal: Negative for back pain. Skin: Negative for rash. Neurological: Negative for headaches, focal weakness or numbness.  10-point ROS otherwise negative.  ____________________________________________   PHYSICAL EXAM:  VITAL SIGNS: ED Triage Vitals  Enc Vitals Group     BP 06/26/15 1321 114/78 mmHg     Pulse Rate 06/26/15 1321 73     Resp 06/26/15 1321 18     Temp 06/26/15 1321 97.8 F (36.6 C)     Temp Source 06/26/15 1321 Oral     SpO2 06/26/15 1321 10 %     Weight 06/26/15 1321 120 lb (54.432 kg)     Height 06/26/15 1321 5\' 8"  (1.727 m)     Head Cir --      Peak Flow --      Pain Score --      Pain Loc --      Pain Edu? --      Excl. in GC? --     Constitutional: Alert and oriented. No distress. Chronically malnourished appearance. Somewhat frail. Eyes: Conjunctivae are normal. PERRL. lateral gaze in the left eye, chronic per patient. Head: Atraumatic. Nose: No congestion/rhinnorhea. Mouth/Throat: Mucous membranes are slightly dry.  Oropharynx non-erythematous. Neck: No stridor.   Cardiovascular: Normal rate, regular rhythm. Grossly  normal heart sounds.  Good peripheral circulation. Respiratory: Normal respiratory effort.  No retractions. Lungs CTAB. Gastrointestinal: Soft and mild tenderness across the abdomen, more on the left without focality. No rebound or guarding.. No distention. No abdominal bruits. No CVA tenderness. Genitourinary: Catheter is in place and a circumcised otherwise normal-appearing penis and scrotum. Foley catheter draining dark, sediment laden urine. Hazy in appearance. Rectal exam with a empty vault, but some mild tenderness. No fecal impaction is palpated. Musculoskeletal: No lower extremity tenderness nor edema.  No joint effusions. Patient does have erythema across the sacrum, consistent with a non-eroding but erythematous sacral ulcer which appears to be stable based on previous descriptions. Neurologic:  Normal speech and language. No gross focal neurologic deficits are appreciated. No gait instability. Skin:  Skin is warm, dry and intact. No rash noted. Psychiatric: Mood and affect are normal. Speech and behavior are normal.  ____________________________________________   LABS (all labs ordered are listed, but only abnormal results are displayed)  Labs Reviewed  LIPASE, BLOOD - Abnormal; Notable for the following:    Lipase 17 (*)    All other components within normal limits  COMPREHENSIVE METABOLIC PANEL - Abnormal; Notable for the following:    Chloride 100 (*)    Glucose, Bld 178 (*)    BUN 32 (*)    Creatinine, Ser 1.69 (*)    ALT 12 (*)    GFR calc non Af Amer 35 (*)    GFR calc Af Amer 40 (*)    All other components within normal  limits  CBC - Abnormal; Notable for the following:    WBC 14.7 (*)    RBC 3.49 (*)    Hemoglobin 10.8 (*)    HCT 33.8 (*)    MCHC 31.9 (*)    RDW 15.2 (*)    All other components within normal limits  URINALYSIS COMPLETEWITH MICROSCOPIC ([facility] ONLY) - Abnormal; Notable for the following:    Color, Urine YELLOW (*)    APPearance TURBID (*)     Glucose, UA 50 (*)    Ketones, ur 1+ (*)    Hgb urine dipstick 3+ (*)    Protein, ur 100 (*)    Leukocytes, UA 3+ (*)    Bacteria, UA FEW (*)    Squamous Epithelial / LPF 0-5 (*)    All other components within normal limits  URINE CULTURE   ____________________________________________  EKG   ____________________________________________  RADIOLOGY   IMPRESSION: 1. Rectal fecal impaction. 2. Diverticulosis without evidence of diverticulitis. 3. Infrarenal abdominal aortic ectasia. Ectatic abdominal aorta at risk for aneurysm development. Recommend followup by ultrasound in 5 years. This recommendation follows ACR consensus guidelines: White Paper of the ACR Incidental Findings Committee II on Vascular Findings. J Am Coll Radiol 2013; 10:789-794. ____________________________________________   PROCEDURES  Procedure(s) performed: None  Critical Care performed: No  ____________________________________________   INITIAL IMPRESSION / ASSESSMENT AND PLAN / ED COURSE  Pertinent labs & imaging results that were available during my care of the patient were reviewed by me and considered in my medical decision making (see chart for details).  Patient presents for crampy abdominal pain, no bowel movement in approximately one week, vomiting in the last 2 mornings as well as dark sediment filled urine. His labs are stable, although he does have an increasing white count. His urine is suspicious for ongoing infection which is recently been treated with cephalexin, but ran out about a week ago. In addition, seems that he may be constipated based on history from the patient and his son. We will obtain CT imaging to further evaluate for the cause of his abdominal pain. No cardiac or pulmonary symptoms. He is awake and alert with stable mental status, not being confused per discussion with his son.  ----------------------------------------- 8:27 PM on  06/26/2015 -----------------------------------------  Patient remained stable, really no significant bowel movement just some tiny amount of fecal material after enema. Unable to disimpact. Admit the patient for treatment of fecal impaction and resistant urinary tract infection based on elevated leukocytosis and a urinalysis as we await culture data. Based on his previous culture and recent treatment with cephalosporin, I will initiate ciprofloxacin.  ----------------------------------------- 8:35 PM on 06/26/2015 -----------------------------------------  Discussed with the patient, he is understanding of the plan to be admitted and diagnosis. We'll give him some Tylenol now for discomfort in the lower abdomen which is ongoing. Admit to the hospitalist service for further workup, including treatment for urinary tract infection and fecal impaction. ____________________________________________   FINAL CLINICAL IMPRESSION(S) / ED DIAGNOSES  Final diagnoses:  UTI (urinary tract infection) due to urinary indwelling Foley catheter  Fecal impaction      Sharyn Creamer, MD 06/26/15 2036

## 2015-06-27 DIAGNOSIS — E1122 Type 2 diabetes mellitus with diabetic chronic kidney disease: Secondary | ICD-10-CM | POA: Diagnosis not present

## 2015-06-27 DIAGNOSIS — J9601 Acute respiratory failure with hypoxia: Secondary | ICD-10-CM | POA: Diagnosis not present

## 2015-06-27 DIAGNOSIS — T8351XA Infection and inflammatory reaction due to indwelling urinary catheter, initial encounter: Secondary | ICD-10-CM | POA: Diagnosis not present

## 2015-06-27 DIAGNOSIS — K5641 Fecal impaction: Secondary | ICD-10-CM | POA: Diagnosis not present

## 2015-06-27 LAB — GLUCOSE, CAPILLARY
GLUCOSE-CAPILLARY: 40 mg/dL — AB (ref 65–99)
GLUCOSE-CAPILLARY: 147 mg/dL — AB (ref 65–99)
GLUCOSE-CAPILLARY: 68 mg/dL (ref 65–99)
GLUCOSE-CAPILLARY: 75 mg/dL (ref 65–99)
GLUCOSE-CAPILLARY: 96 mg/dL (ref 65–99)
Glucose-Capillary: 128 mg/dL — ABNORMAL HIGH (ref 65–99)

## 2015-06-27 LAB — BASIC METABOLIC PANEL
ANION GAP: 8 (ref 5–15)
BUN: 37 mg/dL — AB (ref 6–20)
CHLORIDE: 104 mmol/L (ref 101–111)
CO2: 26 mmol/L (ref 22–32)
Calcium: 8.6 mg/dL — ABNORMAL LOW (ref 8.9–10.3)
Creatinine, Ser: 1.68 mg/dL — ABNORMAL HIGH (ref 0.61–1.24)
GFR, EST AFRICAN AMERICAN: 41 mL/min — AB (ref >60)
GFR, EST NON AFRICAN AMERICAN: 35 mL/min — AB (ref >60)
Glucose, Bld: 44 mg/dL — CL (ref 65–99)
POTASSIUM: 4.4 mmol/L (ref 3.5–5.1)
SODIUM: 138 mmol/L (ref 135–145)

## 2015-06-27 LAB — CBC
HEMATOCRIT: 27.9 % — AB (ref 40.0–52.0)
HEMOGLOBIN: 9.2 g/dL — AB (ref 13.0–18.0)
MCH: 31.9 pg (ref 26.0–34.0)
MCHC: 32.9 g/dL (ref 32.0–36.0)
MCV: 96.9 fL (ref 80.0–100.0)
Platelets: 150 10*3/uL (ref 150–440)
RBC: 2.88 MIL/uL — AB (ref 4.40–5.90)
RDW: 15.4 % — AB (ref 11.5–14.5)
WBC: 11.5 10*3/uL — AB (ref 3.8–10.6)

## 2015-06-27 MED ORDER — ONDANSETRON HCL 4 MG/2ML IJ SOLN
4.0000 mg | Freq: Four times a day (QID) | INTRAMUSCULAR | Status: DC | PRN
  Administered 2015-06-28: 4 mg via INTRAVENOUS
  Filled 2015-06-27: qty 2

## 2015-06-27 MED ORDER — CIPROFLOXACIN IN D5W 400 MG/200ML IV SOLN
400.0000 mg | INTRAVENOUS | Status: DC
  Administered 2015-06-27 – 2015-06-28 (×2): 400 mg via INTRAVENOUS
  Filled 2015-06-27 (×3): qty 200

## 2015-06-27 MED ORDER — DEXTROSE-NACL 5-0.9 % IV SOLN
INTRAVENOUS | Status: DC
  Administered 2015-06-27 – 2015-06-28 (×3): via INTRAVENOUS

## 2015-06-27 MED ORDER — FINASTERIDE 5 MG PO TABS
5.0000 mg | ORAL_TABLET | Freq: Every day | ORAL | Status: DC
  Administered 2015-06-27 – 2015-06-30 (×4): 5 mg via ORAL
  Filled 2015-06-27 (×4): qty 1

## 2015-06-27 MED ORDER — HYDROCORTISONE 2.5 % RE CREA
TOPICAL_CREAM | Freq: Once | RECTAL | Status: AC
  Administered 2015-06-27: via RECTAL
  Filled 2015-06-27: qty 28.35

## 2015-06-27 MED ORDER — MINERAL OIL RE ENEM
1.0000 | ENEMA | Freq: Two times a day (BID) | RECTAL | Status: DC
  Administered 2015-06-27 – 2015-06-28 (×2): 1 via RECTAL

## 2015-06-27 MED ORDER — FLEET ENEMA 7-19 GM/118ML RE ENEM
1.0000 | ENEMA | Freq: Once | RECTAL | Status: AC
  Administered 2015-06-27: 1 via RECTAL

## 2015-06-27 MED ORDER — MORPHINE SULFATE (PF) 2 MG/ML IV SOLN
2.0000 mg | INTRAVENOUS | Status: DC | PRN
  Administered 2015-06-27: 2 mg via INTRAVENOUS
  Filled 2015-06-27: qty 1

## 2015-06-27 MED ORDER — INSULIN ASPART 100 UNIT/ML ~~LOC~~ SOLN
0.0000 [IU] | Freq: Four times a day (QID) | SUBCUTANEOUS | Status: DC
  Administered 2015-06-27: 1 [IU] via SUBCUTANEOUS
  Administered 2015-06-28: 3 [IU] via SUBCUTANEOUS
  Administered 2015-06-28: 5 [IU] via SUBCUTANEOUS
  Administered 2015-06-29: 3 [IU] via SUBCUTANEOUS
  Administered 2015-06-29: 2 [IU] via SUBCUTANEOUS
  Administered 2015-06-30: 1 [IU] via SUBCUTANEOUS
  Administered 2015-06-30: 3 [IU] via SUBCUTANEOUS
  Filled 2015-06-27: qty 2
  Filled 2015-06-27: qty 1
  Filled 2015-06-27: qty 5
  Filled 2015-06-27: qty 1
  Filled 2015-06-27: qty 3
  Filled 2015-06-27: qty 2
  Filled 2015-06-27 (×2): qty 3

## 2015-06-27 MED ORDER — ACETAMINOPHEN 650 MG RE SUPP: 650.0000 mg | Freq: Four times a day (QID) | RECTAL | Status: DC | PRN

## 2015-06-27 MED ORDER — MAGNESIUM HYDROXIDE 400 MG/5ML PO SUSP: 30.0000 mL | Freq: Every day | ORAL | Status: DC | PRN

## 2015-06-27 MED ORDER — HEPARIN SODIUM (PORCINE) 5000 UNIT/ML IJ SOLN
5000.0000 [IU] | Freq: Three times a day (TID) | INTRAMUSCULAR | Status: DC
  Administered 2015-06-27 – 2015-06-28 (×5): 5000 [IU] via SUBCUTANEOUS
  Filled 2015-06-27 (×5): qty 1

## 2015-06-27 MED ORDER — ASPIRIN EC 81 MG PO TBEC
81.0000 mg | DELAYED_RELEASE_TABLET | Freq: Every day | ORAL | Status: DC
  Administered 2015-06-27 – 2015-06-30 (×4): 81 mg via ORAL
  Filled 2015-06-27 (×4): qty 1

## 2015-06-27 MED ORDER — SODIUM CHLORIDE 0.9 % IV SOLN
INTRAVENOUS | Status: DC
  Administered 2015-06-27: 02:00:00 via INTRAVENOUS

## 2015-06-27 MED ORDER — CLOPIDOGREL BISULFATE 75 MG PO TABS
75.0000 mg | ORAL_TABLET | Freq: Every day | ORAL | Status: DC
  Administered 2015-06-27 – 2015-06-30 (×4): 75 mg via ORAL
  Filled 2015-06-27 (×4): qty 1

## 2015-06-27 MED ORDER — POLYETHYLENE GLYCOL 3350 17 G PO PACK
17.0000 g | PACK | Freq: Every day | ORAL | Status: DC
  Administered 2015-06-27 – 2015-06-28 (×2): 17 g via ORAL
  Filled 2015-06-27 (×2): qty 1

## 2015-06-27 MED ORDER — CITALOPRAM HYDROBROMIDE 20 MG PO TABS
10.0000 mg | ORAL_TABLET | Freq: Every day | ORAL | Status: DC
  Administered 2015-06-27 – 2015-06-30 (×4): 10 mg via ORAL
  Filled 2015-06-27 (×4): qty 1

## 2015-06-27 MED ORDER — DOCUSATE SODIUM 100 MG PO CAPS
100.0000 mg | ORAL_CAPSULE | Freq: Two times a day (BID) | ORAL | Status: DC
  Administered 2015-06-27 – 2015-06-29 (×5): 100 mg via ORAL
  Filled 2015-06-27 (×6): qty 1

## 2015-06-27 MED ORDER — DEXTROSE 50 % IV SOLN
1.0000 | Freq: Once | INTRAVENOUS | Status: AC
Start: 2015-06-27 — End: 2015-06-27
  Administered 2015-06-27: 50 mL via INTRAVENOUS
  Filled 2015-06-27: qty 50

## 2015-06-27 MED ORDER — ACETAMINOPHEN 325 MG PO TABS
650.0000 mg | ORAL_TABLET | Freq: Four times a day (QID) | ORAL | Status: DC | PRN
  Administered 2015-06-28: 650 mg via ORAL
  Filled 2015-06-27: qty 2

## 2015-06-27 NOTE — Consult Note (Addendum)
WOC wound consult note Reason for Consult: Consult requested for sacrum wound.  Pt had stage 2 pressure injury noted as present during the previous hospital admission on 8/2.  Pt very thin with protruding bone. Wound type: Remains with a stage 2 pressure injury to sacrum Pressure Ulcer POA: Yes/No Measurement: Erythremia surrounding wound to area 3X3cm Wound bed: Stage 2: 1X.1X.1cm, red and moist Drainage (amount, consistency, odor) Small amt yellow drainage, no odor Dressing procedure/placement/frequency: Foam dressing has been applied to protect and promote healing.  Continue present plan of care. Please re-consult if further assistance is needed.  Thank-you,  Cammie Mcgee MSN, RN, CWOCN, [place], CNS [phone]

## 2015-06-27 NOTE — Progress Notes (Signed)
[facility] - [place] at [facility]   PATIENT NAME: Andres [name]    MR#:  [id number]  DATE OF BIRTH:  Feb 14, 1926  SUBJECTIVE:  CHIEF COMPLAINT:   Chief Complaint  Patient presents with  . Emesis   The patient has severe hearing problems and can not communicate.   Unable to obtain ROS.  DRUG ALLERGIES:  No Known Allergies  VITALS:  Blood pressure 98/52, pulse 59, temperature 96.6 F (35.9 C), temperature source Axillary, resp. rate 18, height  (1.727 m), weight 52.98 kg (116 lb 12.8 oz), SpO2 100 %.  PHYSICAL EXAMINATION:  GENERAL:  81 y.o.-year-old patient lying in the bed with no acute distress.  EYES: Pupils equal, round, reactive to light and accommodation. No scleral icterus. Extraocular muscles intact.  HEENT: Head atraumatic, normocephalic. Oropharynx and nasopharynx clear.  dry oral mucosa.  NECK:  Supple, no jugular venous distention. No thyroid enlargement, no tenderness.  LUNGS: Normal breath sounds bilaterally, no wheezing, rales,rhonchi or crepitation. No use of accessory muscles of respiration.  CARDIOVASCULAR: S1, S2 normal. No murmurs, rubs, or gallops.  ABDOMEN: Soft, Mild tenderness, nondistended. Bowel sounds present. No organomegaly or mass.  EXTREMITIES: No pedal edema, cyanosis, or clubbing.  NEUROLOGIC:  and able to follow commands. Skin: No rash or jaundice. Sacral DU.  LABORATORY PANEL:   CBC  Recent Labs Lab 06/27/15 0558  WBC 11.5*  HGB 9.2*  HCT 27.9*  PLT 150   ------------------------------------------------------------------------------------------------------------------  Chemistries   Recent Labs Lab 06/26/15 1328 06/27/15 0558  NA 136 138  K 4.6 4.4  CL 100* 104  CO2 23 26  GLUCOSE 178* 44*  BUN 32* 37*  CREATININE 1.69* 1.68*  CALCIUM 9.3 8.6*  AST 26  --   ALT 12*  --   ALKPHOS 86  --   BILITOT 0.7  --     ------------------------------------------------------------------------------------------------------------------  Cardiac Enzymes No results for input(s): TROPONINI in the last 168 hours. ------------------------------------------------------------------------------------------------------------------  RADIOLOGY:  Ct Abdomen Pelvis Wo Contrast  06/26/2015   CLINICAL DATA:  Constipation, nausea, vomiting since last night  EXAM: CT ABDOMEN AND PELVIS WITHOUT CONTRAST  TECHNIQUE: Multidetector CT imaging of the abdomen and pelvis was performed following the standard protocol without IV contrast.  COMPARISON:  None.  FINDINGS: Lower chest: Bilateral calcified pleural plaques. Small right pleural effusion. Right basilar atelectasis. Small right pericardial calcification.  Hepatobiliary: Normal liver. Punctate calcification in the left hepatic lobe likely reflecting sequela prior granulomatous disease. Prior cholecystectomy.  Pancreas: Normal.  Spleen: Normal.  Adrenals/Urinary Tract: Normal adrenal glands. 2 hypodense, fluid attenuating right renal mass is most consistent with cysts with the largest measuring 19 mm. No obstructive uropathy. Bilateral renal vascular calcifications. Decompressed bladder with a Foley catheter present.  Stomach/Bowel: Mild distension of the stomach. No bowel obstruction. Rectal fecal impaction. Scattered diverticular disease without evidence acute diverticulitis. No abdominal or pelvic free fluid. No pneumatosis, pneumoperitoneum or portal venous gas.  Vascular/Lymphatic: Infrarenal abdominal aortic ectasia measuring 2.7 cm in AP diameter. Abdominal aortic atherosclerosis. No abdominal or pelvic lymphadenopathy.  Other: No fluid collection or hematoma.  Musculoskeletal: No lytic or sclerotic osseous lesion. No acute osseous abnormality. Bilateral facet arthropathy throughout the lumbar spine.  IMPRESSION: 1. Rectal fecal impaction. 2. Diverticulosis without evidence of  diverticulitis. 3. Infrarenal abdominal aortic ectasia. Ectatic abdominal aorta at risk for aneurysm development. Recommend followup by ultrasound in 5 years. This recommendation follows ACR consensus guidelines: White Paper of the ACR Incidental Findings Committee II on Vascular  Findings. J Am Coll Radiol 2013; 10:789-794.   Electronically Signed   By: Elige Ko   On: 06/26/2015 18:57   Dg Abd 1 View  06/26/2015   CLINICAL DATA:  Abdominal pain and vomiting for 1 day.  EXAM: ABDOMEN - 1 VIEW  COMPARISON:  03/04/2013  FINDINGS: Prior cholecystectomy. Calcifications project over both renal shadows, shown on prior CT to represent vascular calcifications. Nonobstructive bowel gas pattern. No organomegaly or free air. No acute bony abnormality.  IMPRESSION: No acute findings.   Electronically Signed   By: Charlett Nose M.D.   On: 06/26/2015 13:59    EKG:   Orders placed or performed during the hospital encounter of 06/11/15  . ED EKG  . ED EKG  . EKG 12-Lead  . EKG 12-Lead  . EKG    ASSESSMENT AND PLAN:   Fecal impaction - Fleets mineral oil enemas bid for 2 days per Dr. Markham Jordan.  start liquid diet.  Hypotension. The patient's blood pressure was 87/45 this morning, he was treated with normal saline bolus and blood pressure increased to 101/47. Continue IV fluid support.   UTI: Continue Cipro and a follow-up urine culture.  CKD stage III, stable.  Type 2 diabetes mellitus with hypoglycemia this morning.  He was treated with the D50 one dose and started D5 normal saline IV. On sliding scale insulin.  Essential hypertension - controlled, continue home hypertension medication. BPH with urinary obstruction - continue home meds, continue Foley catheter  Sacral ulcer - wound care.  Coronary atherosclerosis of native coronary artery - continue home medications  GERD - PPI at home dose     All the records are reviewed and case discussed with Care Management/Social Workerr. Management  plans discussed with the patient, family and they are in agreement.  CODE STATUS: Full code  TOTAL TIME TAKING CARE OF THIS PATIENT: 35 minutes.   POSSIBLE D/C IN 1-2 DAYS, DEPENDING ON CLINICAL CONDITION.   Shaune Pollack M.D on 06/27/2015 at 4:10 PM  Between 7am to 6pm - Pager - [phone]  After 6pm go to [email or web address] - password [email or web address] [facility]  [facility]  Office  [phone]  CC: Primary care physician; Tillman Abide, MD

## 2015-06-27 NOTE — Consult Note (Signed)
Consultation  Referring Provider: Dr. Anne Hahn Primary Care Physician:  Tillman Abide, MD Consulting  Gastroenterologist:  Dr. Lynnae Prude        Reason for Consultation: Fecal Impaction            HPI:   Andres [name] is a 81 y.o. male with history of CAD, diverticulitis, GERD, HTN, BPH,  type 2 diabetes uncomplicated, history of CVA without deficit, UTI with indwelling catheter, CKD, hyperlipidemia, pressure ulcer coccyx stage II, moderate malnutrition,  depression was discharged from the hospital to home with his son as caregiver on  8/5/206. He had a diagnosis of  encephalopathy d/t hypoglycemia and UTI. He was given Keflex, Florastor, Colace, Ensure along with his chronic medications. He had diarrhea that admission with negative C diff toxin which improved with Imodium.   He was re admitted yesterday for a 2 day history of vomiting and inability to retain his morning medications. He had no bowel movement for >1 week according to admission notes. The patient is unable to give a history, and his son is not at the bedside.   He underwent a colonoscopy 03/16/2008 for abdominal and rectal pain with findings of diverticulosis,. IH large hemorrhoids.    Past Medical History  Diagnosis Date  . CAD (coronary artery disease)   . Diverticulitis   . GERD (gastroesophageal reflux disease)   . Hyperlipidemia   . Hypertension   . BPH (benign prostatic hypertrophy)   . Diabetes mellitus   . DJD (degenerative joint disease)     lumbar spine  . IBS (irritable bowel syndrome)   . Stroke   . Depression 2012  . Acute MI 09/2012  . Chronic kidney disease     ACUTE RENAL FAILURE  . Foley catheter in place 05/09/15    for 1 year per family    Past Surgical History  Procedure Laterality Date  . Angioplasty  1995  . Doppler echocardiography  11/1999    syncope  . Pacemaker insertion      Pacer/cath  (ef 65%)  1/01 -  guidant Discovery II DR pulse generator  . Prostate surgery  07/2003  .  Esophagogastroduodenoscopy  07/2005    neg  . Cardiac catheterization  09/2012    NSTEMI/STENT PLACED  . Insert / replace / remove pacemaker      [id number]    Family History  Problem Relation Age of Onset  . Diabetes Sister      Social History  Substance Use Topics  . Smoking status: Former Smoker    Types: Cigarettes  . Smokeless tobacco: Never Used  . Alcohol Use: No     Comment: heavy in the past    Prior to Admission medications   Medication Sig Start Date End Date Taking? Authorizing Provider  acetaminophen (TYLENOL) 325 MG tablet Take 2 tablets (650 mg total) by mouth every 6 (six) hours as needed for mild pain (or Fever >/= 101). 06/14/15  Yes Ramonita Lab, MD  aspirin EC 81 MG tablet Take 81 mg by mouth daily.   Yes Historical Provider, MD  atorvastatin (LIPITOR) 10 MG tablet Take 10 mg by mouth daily.    Yes Historical Provider, MD  citalopram (CELEXA) 10 MG tablet Take 10 mg by mouth daily.   Yes Historical Provider, MD  clopidogrel (PLAVIX) 75 MG tablet Take 75 mg by mouth daily.   Yes Historical Provider, MD  docusate sodium (COLACE) 100 MG capsule Take 100 mg by mouth 2 (two) times daily as  needed for mild constipation.    Yes Historical Provider, MD  feeding supplement, ENSURE ENLIVE, (ENSURE ENLIVE) LIQD Take 237 mLs by mouth 3 (three) times daily with meals. 06/14/15  Yes Ramonita Lab, MD  finasteride (PROSCAR) 5 MG tablet Take 5 mg by mouth daily.   Yes Historical Provider, MD  fluticasone (FLONASE) 50 MCG/ACT nasal spray Place 2 sprays into the nose daily. Patient taking differently: Place 2 sprays into the nose daily as needed for rhinitis.  05/10/13  Yes Karie Schwalbe, MD  glipiZIDE (GLUCOTROL XL) 5 MG 24 hr tablet Take 5 mg by mouth daily.   Yes Historical Provider, MD  metFORMIN (GLUCOPHAGE) 1000 MG tablet Take 500 mg by mouth 2 (two) times daily.   Yes Historical Provider, MD  metoprolol tartrate (LOPRESSOR) 25 MG tablet Take 0.5 tablets (12.5 mg total) by  mouth 2 (two) times daily. Patient taking differently: Take 25 mg by mouth 2 (two) times daily.  05/14/15  Yes Srikar Sudini, MD  Multiple Vitamin (MULTIVITAMIN WITH MINERALS) TABS tablet Take 1 tablet by mouth daily. 05/10/15  Yes Srikar Sudini, MD  nitroGLYCERIN (NITROSTAT) 0.4 MG SL tablet Place 0.4 mg under the tongue every 5 (five) minutes as needed for chest pain.    Yes Historical Provider, MD  saccharomyces boulardii (FLORASTOR) 250 MG capsule Take 1 capsule (250 mg total) by mouth 2 (two) times daily. 06/14/15  Yes Ramonita Lab, MD  triamcinolone cream (KENALOG) 0.1 % Apply 1 application topically 2 (two) times daily as needed. For itchy areas Patient taking differently: Apply 1 application topically 2 (two) times daily as needed (for dry skin).  10/26/13  Yes Karie Schwalbe, MD  albuterol (PROVENTIL) (2.5 MG/3ML) 0.083% nebulizer solution Take 3 mLs (2.5 mg total) by nebulization every 2 (two) hours as needed for wheezing. Patient not taking: Reported on 06/11/2015 05/10/15   Milagros Loll, MD  cephALEXin (KEFLEX) 500 MG capsule Take 1 capsule (500 mg total) by mouth every 12 (twelve) hours. Patient not taking: Reported on 06/26/2015 06/14/15   Ramonita Lab, MD    Current Facility-Administered Medications  Medication Dose Route Frequency Provider Last Rate Last Dose  . acetaminophen (TYLENOL) tablet 650 mg  650 mg Oral Q6H PRN Oralia Manis, MD       Or  . acetaminophen (TYLENOL) suppository 650 mg  650 mg Rectal Q6H PRN Oralia Manis, MD      . aspirin EC tablet 81 mg  81 mg Oral Daily Oralia Manis, MD   81 mg at 06/27/15 1130  . ciprofloxacin (CIPRO) IVPB 400 mg  400 mg Intravenous Q24H Oralia Manis, MD      . citalopram (CELEXA) tablet 10 mg  10 mg Oral Daily Oralia Manis, MD   10 mg at 06/27/15 1130  . clopidogrel (PLAVIX) tablet 75 mg  75 mg Oral Daily Oralia Manis, MD   75 mg at 06/27/15 1129  . dextrose 5 %-0.9 % sodium chloride infusion   Intravenous Continuous Shaune Pollack, MD 75 mL/hr at  06/27/15 1023    . finasteride (PROSCAR) tablet 5 mg  5 mg Oral Daily Oralia Manis, MD   5 mg at 06/27/15 1130  . heparin injection 5,000 Units  5,000 Units Subcutaneous 3 times per day Oralia Manis, MD   5,000 Units at 06/27/15 [phone]  . insulin aspart (novoLOG) injection 0-9 Units  0-9 Units Subcutaneous Q6H Oralia Manis, MD   1 Units at 06/27/15 0140  . morphine 2 MG/ML injection 2 mg  2 mg Intravenous Q4H PRN Oralia Manis, MD   2 mg at 06/27/15 0141  . ondansetron (ZOFRAN) injection 4 mg  4 mg Intravenous Q6H PRN Oralia Manis, MD        Allergies as of 06/26/2015  . (No Known Allergies)     Review of Systems:    He is unable to give a history. He reports "a little bit" to every question asked. He knows his name and tells me that he lives with his son. He is thirsty and wants to eat.    Physical Exam:  Vital signs in last 24 hours: Temp:  [96.6 F (35.9 C)-97.4 F (36.3 C)] 96.6 F (35.9 C) (08/18 0822) Pulse Rate:  [59-71] 59 (08/18 0822) Resp:  [16-20] 18 (08/18 0822) BP: (85-113)/(39-63) 87/45 mmHg (08/18 0825) SpO2:  [93 %-100 %] 100 % (08/18 0822) Weight:  [52.98 kg (116 lb 12.8 oz)] 52.98 kg (116 lb 12.8 oz) (08/18 0137)    General:  Frail, thin elderly male,  NAD Head:  Head without obvious abnormality, atraumatic  Eyes:   Conjunctiva pink, sclera anicteric   ENT:   Mouth free of lesions, mucosa moist, very HOH Neck:   Supple w/o thyromegaly or mass, trachea midline, no adenopathy  Lungs: Clear to auscultation bilaterally, respirations unlabored Heart:     Normal S1S2, no rubs, murmurs, gallops. Pacemaker. Abdomen: Soft, non-tender, no hepatosplenomegaly, hernia, or mass and BS normal Rectal: Mushy soft pile of stool high up in vault, brown, hemoccult negative.  GU:  Foley catheter present draining dark yellow urine Lymph:  No cervical or supraclavicular adenopathy. Extremities:   No edema, cyanosis, or clubbing, Op site over coccyx- wound not evaluated Skin  Skin  color, texture, turgor normal, no rashes or lesions Neuro:  Awake, A &O x 2, follows commands Psych:  Appropriate mood and affect. Pleasant.  Data Reviewed:  LAB RESULTS:  Recent Labs  06/26/15 1328 06/27/15 0558  WBC 14.7* 11.5*  HGB 10.8* 9.2*  HCT 33.8* 27.9*  PLT 204 150   BMET  Recent Labs  06/26/15 1328 06/27/15 0558  NA 136 138  K 4.6 4.4  CL 100* 104  CO2 23 26  GLUCOSE 178* 44*  BUN 32* 37*  CREATININE 1.69* 1.68*  CALCIUM 9.3 8.6*   LFT  Recent Labs  06/26/15 1328  PROT 7.7  ALBUMIN 3.9  AST 26  ALT 12*  ALKPHOS 86  BILITOT 0.7   PT/INR No results for input(s): LABPROT, INR in the last 72 hours.  STUDIES: Ct Abdomen Pelvis Wo Contrast  06/26/2015   CLINICAL DATA:  Constipation, nausea, vomiting since last night  EXAM: CT ABDOMEN AND PELVIS WITHOUT CONTRAST  TECHNIQUE: Multidetector CT imaging of the abdomen and pelvis was performed following the standard protocol without IV contrast.  COMPARISON:  None.  FINDINGS: Lower chest: Bilateral calcified pleural plaques. Small right pleural effusion. Right basilar atelectasis. Small right pericardial calcification.  Hepatobiliary: Normal liver. Punctate calcification in the left hepatic lobe likely reflecting sequela prior granulomatous disease. Prior cholecystectomy.  Pancreas: Normal.  Spleen: Normal.  Adrenals/Urinary Tract: Normal adrenal glands. 2 hypodense, fluid attenuating right renal mass is most consistent with cysts with the largest measuring 19 mm. No obstructive uropathy. Bilateral renal vascular calcifications. Decompressed bladder with a Foley catheter present.  Stomach/Bowel: Mild distension of the stomach. No bowel obstruction. Rectal fecal impaction. Scattered diverticular disease without evidence acute diverticulitis. No abdominal or pelvic free fluid. No pneumatosis, pneumoperitoneum or portal venous gas.  Vascular/Lymphatic: Infrarenal abdominal aortic ectasia measuring 2.7 cm in AP diameter.  Abdominal aortic atherosclerosis. No abdominal or pelvic lymphadenopathy.  Other: No fluid collection or hematoma.  Musculoskeletal: No lytic or sclerotic osseous lesion. No acute osseous abnormality. Bilateral facet arthropathy throughout the lumbar spine.  IMPRESSION: 1. Rectal fecal impaction. 2. Diverticulosis without evidence of diverticulitis. 3. Infrarenal abdominal aortic ectasia. Ectatic abdominal aorta at risk for aneurysm development. Recommend followup by ultrasound in 5 years. This recommendation follows ACR consensus guidelines: White Paper of the ACR Incidental Findings Committee II on Vascular Findings. J Am Coll Radiol 2013; 10:789-794.   Electronically Signed   By: Elige Ko   On: 06/26/2015 18:57   Dg Abd 1 View  06/26/2015   CLINICAL DATA:  Abdominal pain and vomiting for 1 day.  EXAM: ABDOMEN - 1 VIEW  COMPARISON:  03/04/2013  FINDINGS: Prior cholecystectomy. Calcifications project over both renal shadows, shown on prior CT to represent vascular calcifications. Nonobstructive bowel gas pattern. No organomegaly or free air. No acute bony abnormality.  IMPRESSION: No acute findings.   Electronically Signed   By: Charlett Nose M.D.   On: 06/26/2015 13:59   Assessment:  Andres [name] is a 81 y.o. presents with reports of vomiting and lack of bowel movement. The CT showed fecal impaction and diverticulosis. His abd  xray was unremarkable. His rectal exam showed soft stool in vault heme neg. He does have a history of colonoscopy in 2009 with large IH and diverticulosis.   Plan:  This would be difficult to disimpact physically as it is so high and soft. Care with the hx of large IH as would not want to start that hemorrhoid  bleeding. This case was discussed with  Dr. Mechele Collin  And he advises Fleets mineral oil enemas bid for 2 days and to monitor how he does. Thank you for the consultation.  These services provided by Amedeo Kinsman RN, MSN, ANP-BC under collaborative practice agreement  with Scot Jun, MD.   06/27/2015, 1:36 PM

## 2015-06-27 NOTE — Consult Note (Signed)
Pt has stool impaction so I did rectal disimpaction digital work.  And ordered the nurse to give him a bottle of Miralax and a fleets enema.

## 2015-06-27 NOTE — Progress Notes (Signed)
Son called at 10:00 am and 14:00.  Left message, requested that he call me back or come to the floor in order to facilitate the patients admission.

## 2015-06-27 NOTE — Progress Notes (Signed)
CBG increased to 147 after amp of D50 given per MD orders. Will cont to monitor.

## 2015-06-27 NOTE — Progress Notes (Signed)
3ANTIBIOTIC CONSULT NOTE - INITIAL  Pharmacy Consult for ciprofloxacin Indication: UTI  No Known Allergies  Patient Measurements: Height: 5\' 8"  (172.7 cm) Weight: 120 lb (54.432 kg) IBW/kg (Calculated) : 68.4 Adjusted Body Weight: 54.4 kg  Vital Signs: BP: 90/57 mmHg (08/18 0052) Pulse Rate: 60 (08/18 0052) Intake/Output from previous day:   Intake/Output from this shift:    Labs:  Recent Labs  06/26/15 1328  WBC 14.7*  HGB 10.8*  PLT 204  CREATININE 1.69*   Estimated Creatinine Clearance: 23.7 mL/min (by C-G formula based on Cr of 1.69). No results for input(s): VANCOTROUGH, VANCOPEAK, VANCORANDOM, GENTTROUGH, GENTPEAK, GENTRANDOM, TOBRATROUGH, TOBRAPEAK, TOBRARND, AMIKACINPEAK, AMIKACINTROU, AMIKACIN in the last 72 hours.   Microbiology: Recent Results (from the past 720 hour(s))  Urine culture     Status: None   Collection Time: 06/11/15  8:36 PM  Result Value Ref Range Status   Specimen Description URINE, CLEAN CATCH  Final   Special Requests NONE  Final   Culture   Final    >=100,000 COLONIES/mL KLEBSIELLA PNEUMONIAE >=100,000 COLONIES/mL ENTEROCOCCUS FAECALIS    Report Status 06/15/2015 FINAL  Final   Organism ID, Bacteria KLEBSIELLA PNEUMONIAE  Final   Organism ID, Bacteria ENTEROCOCCUS FAECALIS  Final      Susceptibility   Klebsiella pneumoniae - MIC*    AMPICILLIN >=32 RESISTANT Resistant     CEFAZOLIN <=4 SENSITIVE Sensitive     CEFTRIAXONE <=1 SENSITIVE Sensitive     CIPROFLOXACIN <=0.25 SENSITIVE Sensitive     GENTAMICIN <=1 SENSITIVE Sensitive     IMIPENEM <=0.25 SENSITIVE Sensitive     NITROFURANTOIN 64 INTERMEDIATE Intermediate     TRIMETH/SULFA <=20 SENSITIVE Sensitive     PIP/TAZO Value in next row Sensitive      SENSITIVE<=4    * >=100,000 COLONIES/mL KLEBSIELLA PNEUMONIAE   Enterococcus faecalis - MIC*    AMPICILLIN Value in next row Sensitive      SENSITIVE<=4    NITROFURANTOIN Value in next row Sensitive      SENSITIVE<=4   LINEZOLID Value in next row Sensitive      SENSITIVE<=4    LEVOFLOXACIN Value in next row Resistant      RESISTANT>=8    TETRACYCLINE Value in next row Sensitive      SENSITIVE<=1    * >=100,000 COLONIES/mL ENTEROCOCCUS FAECALIS  Blood culture (routine x 2)     Status: None   Collection Time: 06/11/15 10:14 PM  Result Value Ref Range Status   Specimen Description BLOOD BLOOD RIGHT FOREARM  Final   Special Requests BOTTLES DRAWN AEROBIC AND ANAEROBIC 1CC  Final   Culture NO GROWTH 6 DAYS  Final   Report Status 06/17/2015 FINAL  Final  Blood culture (routine x 2)     Status: None   Collection Time: 06/11/15 10:14 PM  Result Value Ref Range Status   Specimen Description BLOOD RIGHT ASSIST CONTROL  Final   Special Requests BOTTLES DRAWN AEROBIC AND ANAEROBIC 6CC  Final   Culture NO GROWTH 6 DAYS  Final   Report Status 06/17/2015 FINAL  Final  C difficile quick scan w PCR reflex     Status: None   Collection Time: 06/12/15 11:48 AM  Result Value Ref Range Status   C Diff antigen NEGATIVE NEGATIVE Final   C Diff toxin NEGATIVE NEGATIVE Final   C Diff interpretation Negative for C. difficile  Final    Medical History: Past Medical History  Diagnosis Date  . CAD (coronary artery disease)   .  Diverticulitis   . GERD (gastroesophageal reflux disease)   . Hyperlipidemia   . Hypertension   . BPH (benign prostatic hypertrophy)   . Diabetes mellitus   . DJD (degenerative joint disease)     lumbar spine  . IBS (irritable bowel syndrome)   . Stroke   . Depression 2012  . Acute MI 09/2012  . Chronic kidney disease     ACUTE RENAL FAILURE  . Foley catheter in place 05/09/15    for 1 year per family    Medications:  Infusions:  . sodium chloride     Assessment: 89 yom cc emesis and history of chronic indwelling foley/BPH with retention here for fecal impaction and UTI. CrCl < 30 mL/min looks like baseline kidney dysfunction. On 06/11/15 had positive urine culture, k. pneumoniae and  e. faecalis, both susceptible to cipro.  Goal of Therapy:  Resolve urinary symptoms  Plan:  Expected duration 5 days with resolution of temperature and/or normalization of WBC. Ciprofloxacin 400 mg IV Q24H based on CrCl less than 30 mL/min. Will follow up culture results and narrow if able.  Carola Frost, Pharm.D. Clinical Pharmacist 06/27/2015,1:33 AM

## 2015-06-27 NOTE — Progress Notes (Signed)
Patient was hypoglycemic, patient rechecked at 10:298 and was 75.  Patient initiated on D5 NS per Dr. Imogene Burn.  Will continue to monitor.

## 2015-06-27 NOTE — Progress Notes (Signed)
Inpatient Diabetes Program Recommendations  AACE/ADA: New Consensus Statement on Inpatient Glycemic Control (2013)  Target Ranges:  Prepandial:   less than 140 mg/dL      Peak postprandial:   less than 180 mg/dL (1-2 hours)      Critically ill patients:  140 - 180 mg/dL   Reason for review: low blood sugars  Diabetes history: Type 2 Outpatient Diabetes medications: Glucotrol /day, Metformin  bid  Current orders for Inpatient glycemic control: Novolog correction insulin 0-9 units q6h  Consider d/c correction insulin.  We will continue to follow.   Susette Racer, RN, BA, MHA, CDE Diabetes Coordinator Inpatient Diabetes Program  [phone] (Team Pager) [phone] [facility] Office) 06/27/2015 11:10 AM

## 2015-06-27 NOTE — Progress Notes (Signed)
Initial Nutrition Assessment  DOCUMENTATION CODES:   Severe malnutrition in context of chronic illness  INTERVENTION:  Coordination of care: Await diet progression   NUTRITION DIAGNOSIS:   Inadequate oral intake related to altered GI function as evidenced by NPO status.    GOAL:   Patient will meet greater than or equal to 90% of their needs    MONITOR:    (Energy intake, Digestive system)  REASON FOR ASSESSMENT:   Malnutrition Screening Tool    ASSESSMENT:      Pt admitted with  nausea, vomiting for the past 2 days per H&P, fecal impaction  Past Medical History  Diagnosis Date  . CAD (coronary artery disease)   . Diverticulitis   . GERD (gastroesophageal reflux disease)   . Hyperlipidemia   . Hypertension   . BPH (benign prostatic hypertrophy)   . Diabetes mellitus   . DJD (degenerative joint disease)     lumbar spine  . IBS (irritable bowel syndrome)   . Stroke   . Depression 2012  . Acute MI 09/2012  . Chronic kidney disease     ACUTE RENAL FAILURE  . Foley catheter in place 05/09/15    for 1 year per family    Current Nutrition: NPO  Food/Nutrition-Related History: pt extremely HOH and unable to gather information from pt.  Pt reports needs battery in hearing aide   Medications: D5 NS at 71ml/hr, aspart  Electrolyte/Renal Profile and Glucose Profile:   Recent Labs Lab 06/26/15 1328 06/27/15 0558  NA 136 138  K 4.6 4.4  CL 100* 104  CO2 23 26  BUN 32* 37*  CREATININE 1.69* 1.68*  CALCIUM 9.3 8.6*  GLUCOSE 178* 44*   Protein Profile:  Recent Labs Lab 06/26/15 1328  ALBUMIN 3.9     Last BM: no BM for the last week   Nutrition-Focused Physical Exam Findings: Nutrition-Focused physical exam completed. Findings are mild/moderate to severe fat depletion, mild/moderate to severe muscle depletion, and none edema.      Weight Change: 11% weight loss in the last 6months    Diet Order:  Diet NPO time specified Except for: Sips  with Meds  Skin:   pressure ulcer stage II      Height:   Ht Readings from Last 1 Encounters:  06/27/15  (1.727 m)    Weight:   Wt Readings from Last 1 Encounters:  06/27/15 116 lb 12.8 oz (52.98 kg)     BMI:  Body mass index is 17.76 kg/(m^2).  Estimated Nutritional Needs:   Kcal:  BEE 1349 kcals (IF 1.1-1.3, AF 1.2) 1780-2104 kcals/d  Protein:  (1.2-1.5 g/kg) 84-105 g/d   Fluid:  (30-45ml/kg) 2100-2465ml/d  EDUCATION NEEDS:   No education needs identified at this time  HIGH Care Level  Riti Rollyson B. Freida Busman, RD, LDN [phone] (pager).

## 2015-06-27 NOTE — Progress Notes (Signed)
Dr. Betti Cruz notified of CBG 40. Pt is NPO. MD stated to give amp of D50.

## 2015-06-28 ENCOUNTER — Observation Stay: Payer: Medicare Other

## 2015-06-28 DIAGNOSIS — M1990 Unspecified osteoarthritis, unspecified site: Secondary | ICD-10-CM | POA: Diagnosis present

## 2015-06-28 DIAGNOSIS — Z9861 Coronary angioplasty status: Secondary | ICD-10-CM | POA: Diagnosis not present

## 2015-06-28 DIAGNOSIS — R0602 Shortness of breath: Secondary | ICD-10-CM | POA: Diagnosis not present

## 2015-06-28 DIAGNOSIS — N183 Chronic kidney disease, stage 3 (moderate): Secondary | ICD-10-CM | POA: Diagnosis present

## 2015-06-28 DIAGNOSIS — L89152 Pressure ulcer of sacral region, stage 2: Secondary | ICD-10-CM | POA: Diagnosis present

## 2015-06-28 DIAGNOSIS — N138 Other obstructive and reflux uropathy: Secondary | ICD-10-CM | POA: Diagnosis present

## 2015-06-28 DIAGNOSIS — K649 Unspecified hemorrhoids: Secondary | ICD-10-CM | POA: Diagnosis present

## 2015-06-28 DIAGNOSIS — I5021 Acute systolic (congestive) heart failure: Secondary | ICD-10-CM | POA: Diagnosis not present

## 2015-06-28 DIAGNOSIS — K5641 Fecal impaction: Secondary | ICD-10-CM | POA: Diagnosis present

## 2015-06-28 DIAGNOSIS — I213 ST elevation (STEMI) myocardial infarction of unspecified site: Secondary | ICD-10-CM | POA: Diagnosis not present

## 2015-06-28 DIAGNOSIS — Z87891 Personal history of nicotine dependence: Secondary | ICD-10-CM | POA: Diagnosis not present

## 2015-06-28 DIAGNOSIS — Z7902 Long term (current) use of antithrombotics/antiplatelets: Secondary | ICD-10-CM | POA: Diagnosis not present

## 2015-06-28 DIAGNOSIS — Z950 Presence of cardiac pacemaker: Secondary | ICD-10-CM | POA: Diagnosis not present

## 2015-06-28 DIAGNOSIS — Z833 Family history of diabetes mellitus: Secondary | ICD-10-CM | POA: Diagnosis not present

## 2015-06-28 DIAGNOSIS — I77811 Abdominal aortic ectasia: Secondary | ICD-10-CM | POA: Diagnosis present

## 2015-06-28 DIAGNOSIS — N401 Enlarged prostate with lower urinary tract symptoms: Secondary | ICD-10-CM | POA: Diagnosis present

## 2015-06-28 DIAGNOSIS — I2510 Atherosclerotic heart disease of native coronary artery without angina pectoris: Secondary | ICD-10-CM | POA: Diagnosis present

## 2015-06-28 DIAGNOSIS — E785 Hyperlipidemia, unspecified: Secondary | ICD-10-CM | POA: Diagnosis present

## 2015-06-28 DIAGNOSIS — H9190 Unspecified hearing loss, unspecified ear: Secondary | ICD-10-CM | POA: Diagnosis present

## 2015-06-28 DIAGNOSIS — I252 Old myocardial infarction: Secondary | ICD-10-CM | POA: Diagnosis not present

## 2015-06-28 DIAGNOSIS — K5790 Diverticulosis of intestine, part unspecified, without perforation or abscess without bleeding: Secondary | ICD-10-CM | POA: Diagnosis present

## 2015-06-28 DIAGNOSIS — J9601 Acute respiratory failure with hypoxia: Secondary | ICD-10-CM | POA: Diagnosis not present

## 2015-06-28 DIAGNOSIS — E11649 Type 2 diabetes mellitus with hypoglycemia without coma: Secondary | ICD-10-CM | POA: Diagnosis not present

## 2015-06-28 DIAGNOSIS — R338 Other retention of urine: Secondary | ICD-10-CM | POA: Diagnosis present

## 2015-06-28 DIAGNOSIS — I129 Hypertensive chronic kidney disease with stage 1 through stage 4 chronic kidney disease, or unspecified chronic kidney disease: Secondary | ICD-10-CM | POA: Diagnosis present

## 2015-06-28 DIAGNOSIS — K219 Gastro-esophageal reflux disease without esophagitis: Secondary | ICD-10-CM | POA: Diagnosis present

## 2015-06-28 DIAGNOSIS — E1122 Type 2 diabetes mellitus with diabetic chronic kidney disease: Secondary | ICD-10-CM | POA: Diagnosis present

## 2015-06-28 DIAGNOSIS — Z681 Body mass index (BMI) 19 or less, adult: Secondary | ICD-10-CM | POA: Diagnosis not present

## 2015-06-28 DIAGNOSIS — J811 Chronic pulmonary edema: Secondary | ICD-10-CM | POA: Diagnosis present

## 2015-06-28 DIAGNOSIS — Z7982 Long term (current) use of aspirin: Secondary | ICD-10-CM | POA: Diagnosis not present

## 2015-06-28 DIAGNOSIS — F329 Major depressive disorder, single episode, unspecified: Secondary | ICD-10-CM | POA: Diagnosis present

## 2015-06-28 DIAGNOSIS — E440 Moderate protein-calorie malnutrition: Secondary | ICD-10-CM | POA: Diagnosis present

## 2015-06-28 DIAGNOSIS — K589 Irritable bowel syndrome without diarrhea: Secondary | ICD-10-CM | POA: Diagnosis present

## 2015-06-28 DIAGNOSIS — Z8673 Personal history of transient ischemic attack (TIA), and cerebral infarction without residual deficits: Secondary | ICD-10-CM | POA: Diagnosis not present

## 2015-06-28 DIAGNOSIS — I5031 Acute diastolic (congestive) heart failure: Secondary | ICD-10-CM | POA: Diagnosis not present

## 2015-06-28 LAB — GLUCOSE, CAPILLARY
GLUCOSE-CAPILLARY: 215 mg/dL — AB (ref 65–99)
GLUCOSE-CAPILLARY: 277 mg/dL — AB (ref 65–99)
Glucose-Capillary: 75 mg/dL (ref 65–99)
Glucose-Capillary: 290 mg/dL — ABNORMAL HIGH (ref 65–99)
Glucose-Capillary: 265 mg/dL — ABNORMAL HIGH (ref 65–99)

## 2015-06-28 LAB — PROTIME-INR
INR: 1.15
PROTHROMBIN TIME: 14.9 s (ref 11.4–15.0)

## 2015-06-28 LAB — APTT: APTT: 57 s — AB (ref 24–36)

## 2015-06-28 LAB — TROPONIN I
TROPONIN I: 0.33 ng/mL — AB (ref <0.031)
Troponin I: 2.03 ng/mL — ABNORMAL HIGH (ref <0.031)

## 2015-06-28 LAB — BRAIN NATRIURETIC PEPTIDE: B Natriuretic Peptide: 401 pg/mL — ABNORMAL HIGH (ref 0.0–100.0)

## 2015-06-28 MED ORDER — BISACODYL 5 MG PO TBEC: 5.0000 mg | DELAYED_RELEASE_TABLET | Freq: Every day | ORAL | Status: AC | PRN

## 2015-06-28 MED ORDER — FUROSEMIDE 10 MG/ML IJ SOLN
20.0000 mg | INTRAMUSCULAR | Status: AC
  Administered 2015-06-28: 20 mg via INTRAVENOUS
  Filled 2015-06-28: qty 2

## 2015-06-28 MED ORDER — IPRATROPIUM-ALBUTEROL 0.5-2.5 (3) MG/3ML IN SOLN: 3.0000 mL | RESPIRATORY_TRACT | Status: DC | PRN

## 2015-06-28 MED ORDER — HEPARIN (PORCINE) IN NACL 100-0.45 UNIT/ML-% IJ SOLN
650.0000 [IU]/h | INTRAMUSCULAR | Status: DC
Start: 2015-06-28 — End: 2015-06-29
  Administered 2015-06-28: 650 [IU]/h via INTRAVENOUS
  Filled 2015-06-28 (×2): qty 250

## 2015-06-28 MED ORDER — ENSURE ENLIVE PO LIQD
237.0000 mL | Freq: Two times a day (BID) | ORAL | Status: DC
  Administered 2015-06-28 – 2015-06-30 (×3): 237 mL via ORAL

## 2015-06-28 MED ORDER — SODIUM CHLORIDE 0.9 % IJ SOLN: 3.0000 mL | INTRAMUSCULAR | Status: DC | PRN

## 2015-06-28 MED ORDER — SODIUM CHLORIDE 0.9 % IJ SOLN
3.0000 mL | Freq: Two times a day (BID) | INTRAMUSCULAR | Status: DC
  Administered 2015-06-29 – 2015-06-30 (×4): 3 mL via INTRAVENOUS

## 2015-06-28 NOTE — Progress Notes (Signed)
Inpatient Diabetes Program Recommendations  AACE/ADA: New Consensus Statement on Inpatient Glycemic Control (2013)  Target Ranges:  Prepandial:   less than 140 mg/dL      Peak postprandial:   less than 180 mg/dL (1-2 hours)      Critically ill patients:  140 - 180 mg/dL    Spoke with Dr. Imogene Burn regarding my recommendations to d/c correction insulin and order Novolog mealtime insulin 1 unit tid with meals. He is in agreement.    Susette Racer, RN, BA, MHA, CDE Diabetes Coordinator Inpatient Diabetes Program  [phone] (Team Pager) [phone] [facility] Office) 06/28/2015 2:54 PM

## 2015-06-28 NOTE — Care Management (Addendum)
Will transfer to telemetry

## 2015-06-28 NOTE — Progress Notes (Signed)
Patient transferred from [facility], oriented to room, fall, and safety contract reviewed. Focused assessment as charted. No complaints at this time. Skin verified with Robyn Haber, RN. Telemetry box verified. Trudee Kuster

## 2015-06-28 NOTE — Progress Notes (Signed)
Rechecked pt's o2 sats and they had came up to the mid 90s on 2L (see MAR). I put the pt on Room Air and rechecked and the pt remained in the mid 90's.

## 2015-06-28 NOTE — Telephone Encounter (Signed)
Spoke to patient's son per DPR.  Patient was readmitted 06/26/15 with abdominal pain and fecal impaction.  He is currently still inpatient and improving daily.  Will f/u pending discharge to home.

## 2015-06-28 NOTE — Progress Notes (Signed)
Pt complained of chest pain. I took the pt's vitals and his o2 sats were in the low 70's. Beth, RN grabbed a nasal cannula and put him on 2L of o2. His sats slightly improved to the high 70's. We then adjusted it to 3L and they came up into the the mid 80's. I paged Dr. Imogene Burn to inform him of the change in condition. Orders given to administer Lasix IV and a chest xray. Will continue to monitor.

## 2015-06-28 NOTE — Progress Notes (Signed)
Elevated troponin, MD notified, no new orders.

## 2015-06-28 NOTE — Progress Notes (Addendum)
[facility] - [place] [facility]   PATIENT NAME: Andres [name]    MR#:  [id number]  DATE OF BIRTH:  1925-11-30  SUBJECTIVE:  CHIEF COMPLAINT:   Chief Complaint  Patient presents with  . Emesis   No complaints this morning. But is just now patient started to have chest tightness and shortness of breath. O2 saturation decreased to 70s% in room air. He was put on oxygen by nasal cannular 4 L, given Lasix 40 mg IV 1 dose.  DRUG ALLERGIES:  No Known Allergies  VITALS:  Blood pressure 104/48, pulse 90, temperature 98.6 F (37 C), temperature source Oral, resp. rate 17, height  (1.727 m), weight 52.98 kg (116 lb 12.8 oz), SpO2 92 %.  PHYSICAL EXAMINATION:  GENERAL:  81 y.o.-year-old patient lying in the bed with no acute distress.  EYES: Pupils equal, round, reactive to light and accommodation. No scleral icterus. Extraocular muscles intact.  HEENT: Head atraumatic, normocephalic. Oropharynx and nasopharynx clear.  Moist oral mucosa.  NECK:  Supple, no jugular venous distention. No thyroid enlargement, no tenderness.  LUNGS: Normal breath sounds bilaterally, no wheezing, mild rales. No use of accessory muscles of respiration.  CARDIOVASCULAR: S1, S2 normal. No murmurs, rubs, or gallops.  ABDOMEN: Soft, Mild tenderness, nondistended. Bowel sounds present. No organomegaly or mass.  EXTREMITIES: No pedal edema, cyanosis, or clubbing.  NEUROLOGIC:  and able to follow commands. Skin: No rash or jaundice. Sacral DU.  LABORATORY PANEL:   CBC  Recent Labs Lab 06/27/15 0558  WBC 11.5*  HGB 9.2*  HCT 27.9*  PLT 150   ------------------------------------------------------------------------------------------------------------------  Chemistries   Recent Labs Lab 06/26/15 1328 06/27/15 0558  NA 136 138  K 4.6 4.4  CL 100* 104  CO2 23 26  GLUCOSE 178* 44*  BUN 32* 37*  CREATININE 1.69* 1.68*  CALCIUM 9.3 8.6*  AST 26  --   ALT 12*  --    ALKPHOS 86  --   BILITOT 0.7  --    ------------------------------------------------------------------------------------------------------------------  Cardiac Enzymes No results for input(s): TROPONINI in the last 168 hours. ------------------------------------------------------------------------------------------------------------------  RADIOLOGY:  Ct Abdomen Pelvis Wo Contrast  06/26/2015   CLINICAL DATA:  Constipation, nausea, vomiting since last night  EXAM: CT ABDOMEN AND PELVIS WITHOUT CONTRAST  TECHNIQUE: Multidetector CT imaging of the abdomen and pelvis was performed following the standard protocol without IV contrast.  COMPARISON:  None.  FINDINGS: Lower chest: Bilateral calcified pleural plaques. Small right pleural effusion. Right basilar atelectasis. Small right pericardial calcification.  Hepatobiliary: Normal liver. Punctate calcification in the left hepatic lobe likely reflecting sequela prior granulomatous disease. Prior cholecystectomy.  Pancreas: Normal.  Spleen: Normal.  Adrenals/Urinary Tract: Normal adrenal glands. 2 hypodense, fluid attenuating right renal mass is most consistent with cysts with the largest measuring 19 mm. No obstructive uropathy. Bilateral renal vascular calcifications. Decompressed bladder with a Foley catheter present.  Stomach/Bowel: Mild distension of the stomach. No bowel obstruction. Rectal fecal impaction. Scattered diverticular disease without evidence acute diverticulitis. No abdominal or pelvic free fluid. No pneumatosis, pneumoperitoneum or portal venous gas.  Vascular/Lymphatic: Infrarenal abdominal aortic ectasia measuring 2.7 cm in AP diameter. Abdominal aortic atherosclerosis. No abdominal or pelvic lymphadenopathy.  Other: No fluid collection or hematoma.  Musculoskeletal: No lytic or sclerotic osseous lesion. No acute osseous abnormality. Bilateral facet arthropathy throughout the lumbar spine.  IMPRESSION: 1. Rectal fecal impaction. 2.  Diverticulosis without evidence of diverticulitis. 3. Infrarenal abdominal aortic ectasia. Ectatic abdominal aorta at risk for  aneurysm development. Recommend followup by ultrasound in 5 years. This recommendation follows ACR consensus guidelines: White Paper of the ACR Incidental Findings Committee II on Vascular Findings. J Am Coll Radiol 2013; 10:789-794.   Electronically Signed   By: Elige Ko   On: 06/26/2015 18:57   Dg Chest Port 1 View  06/28/2015   CLINICAL DATA:  Shortness of breath.  Nausea and vomiting.  EXAM: PORTABLE CHEST - 1 VIEW  COMPARISON:  05/12/2015 and 03/02/2013  FINDINGS: The patient has hazy bilateral diffuse pulmonary infiltrates with Kerley B-lines the bases consistent with pulmonary edema. Heart size and pulmonary vascularity are normal. Calcification in the arch of the aorta. Pacemaker in place. No acute osseous abnormality.  No appreciable pleural effusions. Pleural calcifications bilaterally.  IMPRESSION: Bilateral pulmonary edema. Pleural calcifications suggest previous asbestos exposure.   Electronically Signed   By: Francene Boyers M.D.   On: 06/28/2015 13:39    EKG:   Orders placed or performed during the hospital encounter of 06/26/15  . EKG 12-Lead  . EKG 12-Lead    ASSESSMENT AND PLAN:   Fecal impaction -improved. Dr. Rickey Barbara rectal disimpaction digital work. He suggested active about off on MiraLAX and Fleet enema last night. The patient had a bowel movement.  Hypotension. Better after normal saline bolus and IV fluid was discontinued.   UTI: Continue Cipro and follow-up urine culture.  CKD stage III, stable.  Type 2 diabetes mellitus with hypoglycemia He was treated with the D50 one dose and started D5 normal saline IV. Hypoglycemia improved. On sliding scale insulin.  Essential hypertension - blood pressure is in low side, no hypertension medication at this time. BPH with urinary obstruction - continue home meds, continue Foley catheter   Sacral ulcer - wound care.  Coronary atherosclerosis of native coronary artery - continue home medications  GERD - PPI at home dose  Acute respiratory failure with hypoxia, possible due to fluid overload secondary to acute diastolic CHF. Chest x-ray show pulmonary edema. He was treated with Lasix 20 mg IV 1 dose. Shortness of breath is better. He was on oxygen and it cannular 4 L which was decreased to 2 L now. I will get troponin level, BNP, continue oxygen by nasal cannula and monitor O2 sat. Patient echocardiograph this June showing ejection fraction 40-45%. I will continue aspirin and Plavix, started DuoNeb when necessary.  Elevated troponin, possible due to demanding ischemia or non-STEMI. Troponin is 0.33 just now. Follow-up troponin, start heparin drip, continue aspirin and Plavix,  and get cardiology consult.   Addition I will get PT evaluation. The patient possibly needs home health and PT or skilled nursing facility.  All the records are reviewed and case discussed with Care Management/Social Workerr. Management plans discussed with the patient, family and they are in agreement.  CODE STATUS: Full code  TOTAL TIME TAKING CARE OF THIS PATIENT: 42 minutes.   POSSIBLE D/C IN 2 DAYS, DEPENDING ON CLINICAL CONDITION.   Shaune Pollack M.D on 06/28/2015 at 2:34 PM  Between 7am to 6pm - Pager - [phone]  After 6pm go to [email or web address] - password [email or web address] [facility]  [facility]  Hospitalists  Office  [phone]  CC: Primary care physician; Tillman Abide, MD

## 2015-06-28 NOTE — Progress Notes (Signed)
Pt has no more complaints of chest tightness. Will continue to monitor.

## 2015-06-28 NOTE — Consult Note (Signed)
Pt with rectal impaction I cleaned out last night, he got some Miralax also.  His troponin was elevated and he was moved to 2A ward.  His abd last night was distended and had palpable colon stool.  Tonight his abd is flat and does not have any palpable stool containing  bowels.  Would keep on stool softener so he does not become impacted again.

## 2015-06-28 NOTE — Care Management (Signed)
Case discussed with Dr Imogene Burn. Patient now with pulmonary Edema. SOB Chest Pain. Discharge canceled. Patient now meets for inpatient status. Changed from observation to inpatient. Still awaiting call back from Son to discuss discharge plans.

## 2015-06-28 NOTE — Progress Notes (Signed)
Consultation         HPI:   Andres [name] reports that he had bowel movements yesterday. He denies abdominal pain. Nursing staff reports 3 movements.  Patient denies any abdominal pain.  There has been no report of rectal bleeding.  The patient has had some spitting up of saliva.  He denies nausea or vomiting.   The patient is experiencing mild mid sternal chest discomfort and low O2 SATs currently and his  lungs have congestion with wheezing. His IV fluids have been stopped, and there is an order for Lasix and chest x-ray.  He has been placed on 3 L [place]. BP good. He is more awake today and answering questions.   Past Medical History  Diagnosis Date  . CAD (coronary artery disease)   . Diverticulitis   . GERD (gastroesophageal reflux disease)   . Hyperlipidemia   . Hypertension   . BPH (benign prostatic hypertrophy)   . Diabetes mellitus   . DJD (degenerative joint disease)     lumbar spine  . IBS (irritable bowel syndrome)   . Stroke   . Depression 2012  . Acute MI 09/2012  . Chronic kidney disease     ACUTE RENAL FAILURE  . Foley catheter in place 05/09/15    for 1 year per family    Past Surgical History  Procedure Laterality Date  . Angioplasty  1995  . Doppler echocardiography  11/1999    syncope  . Pacemaker insertion      Pacer/cath  (ef 65%)  1/01 -  guidant Discovery II DR pulse generator  . Prostate surgery  07/2003  . Esophagogastroduodenoscopy  07/2005    neg  . Cardiac catheterization  09/2012    NSTEMI/STENT PLACED  . Insert / replace / remove pacemaker      [id number]    Family History  Problem Relation Age of Onset  . Diabetes Sister      Social History  Substance Use Topics  . Smoking status: Former Smoker    Types: Cigarettes  . Smokeless tobacco: Never Used  . Alcohol Use: No     Comment: heavy in the past    Prior to Admission medications   Medication Sig Start Date End Date Taking? Authorizing Provider  acetaminophen (TYLENOL) 325 MG  tablet Take 2 tablets (650 mg total) by mouth every 6 (six) hours as needed for mild pain (or Fever >/= 101). 06/14/15  Yes Nicholes Mango, MD  aspirin EC 81 MG tablet Take 81 mg by mouth daily.   Yes Historical Provider, MD  atorvastatin (LIPITOR) 10 MG tablet Take 10 mg by mouth daily.    Yes Historical Provider, MD  citalopram (CELEXA) 10 MG tablet Take 10 mg by mouth daily.   Yes Historical Provider, MD  clopidogrel (PLAVIX) 75 MG tablet Take 75 mg by mouth daily.   Yes Historical Provider, MD  docusate sodium (COLACE) 100 MG capsule Take 100 mg by mouth 2 (two) times daily as needed for mild constipation.    Yes Historical Provider, MD  feeding supplement, ENSURE ENLIVE, (ENSURE ENLIVE) LIQD Take 237 mLs by mouth 3 (three) times daily with meals. 06/14/15  Yes Nicholes Mango, MD  finasteride (PROSCAR) 5 MG tablet Take 5 mg by mouth daily.   Yes Historical Provider, MD  fluticasone (FLONASE) 50 MCG/ACT nasal spray Place 2 sprays into the nose daily. Patient taking differently: Place 2 sprays into the nose daily as needed for rhinitis.  05/10/13  Yes Richard  Felipa Furnace, MD  glipiZIDE (GLUCOTROL XL) 5 MG 24 hr tablet Take 5 mg by mouth daily.   Yes Historical Provider, MD  metFORMIN (GLUCOPHAGE) 1000 MG tablet Take 500 mg by mouth 2 (two) times daily.   Yes Historical Provider, MD  metoprolol tartrate (LOPRESSOR) 25 MG tablet Take 0.5 tablets (12.5 mg total) by mouth 2 (two) times daily. Patient taking differently: Take 25 mg by mouth 2 (two) times daily.  05/14/15  Yes Srikar Sudini, MD  Multiple Vitamin (MULTIVITAMIN WITH MINERALS) TABS tablet Take 1 tablet by mouth daily. 05/10/15  Yes Srikar Sudini, MD  nitroGLYCERIN (NITROSTAT) 0.4 MG SL tablet Place 0.4 mg under the tongue every 5 (five) minutes as needed for chest pain.    Yes Historical Provider, MD  saccharomyces boulardii (FLORASTOR) 250 MG capsule Take 1 capsule (250 mg total) by mouth 2 (two) times daily. 06/14/15  Yes Nicholes Mango, MD  triamcinolone cream  (KENALOG) 0.1 % Apply 1 application topically 2 (two) times daily as needed. For itchy areas Patient taking differently: Apply 1 application topically 2 (two) times daily as needed (for dry skin).  10/26/13  Yes Venia Carbon, MD  albuterol (PROVENTIL) (2.5 MG/3ML) 0.083% nebulizer solution Take 3 mLs (2.5 mg total) by nebulization every 2 (two) hours as needed for wheezing. Patient not taking: Reported on 06/11/2015 05/10/15   Hillary Bow, MD  bisacodyl (BISACODYL) 5 MG EC tablet Take 1 tablet (5 mg total) by mouth daily as needed for moderate constipation. 06/28/15   Demetrios Loll, MD  cephALEXin (KEFLEX) 500 MG capsule Take 1 capsule (500 mg total) by mouth every 12 (twelve) hours. Patient not taking: Reported on 06/26/2015 06/14/15   Nicholes Mango, MD    Current Facility-Administered Medications  Medication Dose Route Frequency Provider Last Rate Last Dose  . acetaminophen (TYLENOL) tablet 650 mg  650 mg Oral Q6H PRN Lance Coon, MD   650 mg at 06/28/15 0022   Or  . acetaminophen (TYLENOL) suppository 650 mg  650 mg Rectal Q6H PRN Lance Coon, MD      . aspirin EC tablet 81 mg  81 mg Oral Daily Lance Coon, MD   81 mg at 06/28/15 0941  . ciprofloxacin (CIPRO) IVPB 400 mg  400 mg Intravenous Q24H Lance Coon, MD   400 mg at 06/27/15 2154  . citalopram (CELEXA) tablet 10 mg  10 mg Oral Daily Lance Coon, MD   10 mg at 06/28/15 0942  . clopidogrel (PLAVIX) tablet 75 mg  75 mg Oral Daily Lance Coon, MD   75 mg at 06/28/15 0942  . dextrose 5 %-0.9 % sodium chloride infusion   Intravenous Continuous Demetrios Loll, MD 75 mL/hr at 06/28/15 0640    . docusate sodium (COLACE) capsule 100 mg  100 mg Oral BID Demetrios Loll, MD   100 mg at 06/28/15 0941  . feeding supplement (ENSURE ENLIVE) (ENSURE ENLIVE) liquid 237 mL  237 mL Oral BID BM Demetrios Loll, MD   237 mL at 06/28/15 1100  . finasteride (PROSCAR) tablet 5 mg  5 mg Oral Daily Lance Coon, MD   5 mg at 06/28/15 0941  . heparin injection 5,000 Units  5,000  Units Subcutaneous 3 times per day Lance Coon, MD   5,000 Units at 06/28/15 [phone]  . insulin aspart (novoLOG) injection 0-9 Units  0-9 Units Subcutaneous Q6H Lance Coon, MD   5 Units at 06/28/15 1245  . mineral oil enema 1 enema  1 enema Rectal BID Joelene Millin  Vic Blackbird, NP   1 enema at 06/28/15 [phone]  . morphine 2 MG/ML injection 2 mg  2 mg Intravenous Q4H PRN Lance Coon, MD   2 mg at 06/27/15 0141  . ondansetron (ZOFRAN) injection 4 mg  4 mg Intravenous Q6H PRN Lance Coon, MD   4 mg at 06/28/15 1118  . polyethylene glycol (MIRALAX / GLYCOLAX) packet 17 g  17 g Oral Daily Manya Silvas, MD   17 g at 06/28/15 6599    Allergies as of 06/26/2015  . (No Known Allergies)     Physical Exam:  Vital signs in last 24 hours: Temp:  [96.3 F (35.7 C)-98.6 F (37 C)] 98.6 F (37 C) (08/18 2314) Pulse Rate:  [58-95] 90 (08/19 1400) Resp:  [16-18] 17 (08/19 0801) BP: (101-134)/(47-66) 104/48 mmHg (08/19 1400) SpO2:  [78 %-100 %] 92 % (08/19 1400) Last BM Date: 06/28/15  General:  Thin, frail no acute distress, HOH, more conversive today Head:  Head without obvious abnormality, atraumatic  Eyes:   Conjunctiva pink, sclera anicteric   ENT:   Mouth free of lesions, mucosa moist,  Neck:   Supple w/o thyromegaly or mass, trachea midline, no adenopathy  Lungs: Congestions with wheezing, basilar crackles, O2 SATs mid 80s on 3 L [place] Heart:     Normal S1S2, no rubs, murmurs, gallops. Abdomen: Soft, non-tender, no hepatosplenomegaly, hernia, or mass and BS normal Rectal: Deferred Lymph:  No cervical or supraclavicular adenopathy. Extremities:   No edema, cyanosis, or clubbing Skin  Skin color, texture, turgor normal, no rashes or lesions Neuro:  A&O x 3. CNII-XII intact, normal strength Psych:  Appropriate mood and affect.  Data Reviewed:  LAB RESULTS:  Recent Labs  06/26/15 1328 06/27/15 0558  WBC 14.7* 11.5*  HGB 10.8* 9.2*  HCT 33.8* 27.9*  PLT 204 150   BMET  Recent Labs   06/26/15 1328 06/27/15 0558  NA 136 138  K 4.6 4.4  CL 100* 104  CO2 23 26  GLUCOSE 178* 44*  BUN 32* 37*  CREATININE 1.69* 1.68*  CALCIUM 9.3 8.6*   LFT  Recent Labs  06/26/15 1328  PROT 7.7  ALBUMIN 3.9  AST 26  ALT 12*  ALKPHOS 86  BILITOT 0.7   PT/INR No results for input(s): LABPROT, INR in the last 72 hours.  STUDIES: Ct Abdomen Pelvis Wo Contrast  06/26/2015   CLINICAL DATA:  Constipation, nausea, vomiting since last night  EXAM: CT ABDOMEN AND PELVIS WITHOUT CONTRAST  TECHNIQUE: Multidetector CT imaging of the abdomen and pelvis was performed following the standard protocol without IV contrast.  COMPARISON:  None.  FINDINGS: Lower chest: Bilateral calcified pleural plaques. Small right pleural effusion. Right basilar atelectasis. Small right pericardial calcification.  Hepatobiliary: Normal liver. Punctate calcification in the left hepatic lobe likely reflecting sequela prior granulomatous disease. Prior cholecystectomy.  Pancreas: Normal.  Spleen: Normal.  Adrenals/Urinary Tract: Normal adrenal glands. 2 hypodense, fluid attenuating right renal mass is most consistent with cysts with the largest measuring 19 mm. No obstructive uropathy. Bilateral renal vascular calcifications. Decompressed bladder with a Foley catheter present.  Stomach/Bowel: Mild distension of the stomach. No bowel obstruction. Rectal fecal impaction. Scattered diverticular disease without evidence acute diverticulitis. No abdominal or pelvic free fluid. No pneumatosis, pneumoperitoneum or portal venous gas.  Vascular/Lymphatic: Infrarenal abdominal aortic ectasia measuring 2.7 cm in AP diameter. Abdominal aortic atherosclerosis. No abdominal or pelvic lymphadenopathy.  Other: No fluid collection or hematoma.  Musculoskeletal: No lytic or sclerotic  osseous lesion. No acute osseous abnormality. Bilateral facet arthropathy throughout the lumbar spine.  IMPRESSION: 1. Rectal fecal impaction. 2. Diverticulosis  without evidence of diverticulitis. 3. Infrarenal abdominal aortic ectasia. Ectatic abdominal aorta at risk for aneurysm development. Recommend followup by ultrasound in 5 years. This recommendation follows ACR consensus guidelines: White Paper of the ACR Incidental Findings Committee II on Vascular Findings. J Am Coll Radiol 2013; 10:789-794.   Electronically Signed   By: Kathreen Devoid   On: 06/26/2015 18:57   Dg Chest Port 1 View  06/28/2015   CLINICAL DATA:  Shortness of breath.  Nausea and vomiting.  EXAM: PORTABLE CHEST - 1 VIEW  COMPARISON:  05/12/2015 and 03/02/2013  FINDINGS: The patient has hazy bilateral diffuse pulmonary infiltrates with Kerley B-lines the bases consistent with pulmonary edema. Heart size and pulmonary vascularity are normal. Calcification in the arch of the aorta. Pacemaker in place. No acute osseous abnormality.  No appreciable pleural effusions. Pleural calcifications bilaterally.  IMPRESSION: Bilateral pulmonary edema. Pleural calcifications suggest previous asbestos exposure.   Electronically Signed   By: Lorriane Shire M.D.   On: 06/28/2015 13:39     Assessment/Plan:  Andres [name] is a 81 y.o.  came in with fecal impaction.  Dr. Vira Agar disimpacted him, he has received fleets enema and one dose of MiraLax.  Nursing staff reports 3 good bowel movements.  Patient offers no abdominal pain.  He appears to be in congestive heart failure at this time.  Orders have been placed for chest x-ray, Lasix, IV fluids have been adjusted per his attending. Continue with daily laxative use. Check met B and CBC in th am.   This case was discussed with Dr. Manya Silvas in collaboration of care. Thank you for the consultation.  These services provided by Denice Paradise RN, MSN, ANP-BC under collaborative practice agreement with Manya Silvas, MD.  06/28/2015, 2:05 PM

## 2015-06-28 NOTE — Progress Notes (Signed)
  Inpatient Diabetes Program Recommendations  AACE/ADA: New Consensus Statement on Inpatient Glycemic Control (2013)  Target Ranges:  Prepandial:   less than 140 mg/dL      Peak postprandial:   less than 180 mg/dL (1-2 hours)      Critically ill patients:  140 - 180 mg/dL   Results for RONDELL, PARDON (MRN [id number]) as of 06/28/2015 14:14  Ref. Range 06/27/2015 10:28 06/27/2015 17:38 06/28/2015 00:10 06/28/2015 06:28 06/28/2015 12:17  Glucose-Capillary Latest Ref Range: 65-99 mg/dL 75 96 409 (H) 75 811 (H)    Reason for review: low blood sugars  Diabetes history: Type 2 Outpatient Diabetes medications: Glucotrol /day, Metformin  bid  Current orders for Inpatient glycemic control: Novolog correction insulin 0-9 units q6h  Consider d/c correction insulin. Consider ordering 1 unit Novolog insulin tid with meals (hold if patient eats less than 50%)  Susette Racer, RN, [place], [place], CDE Diabetes Coordinator Inpatient Diabetes Program  [phone] (Team Pager) [phone] [facility] Office) 06/28/2015 2:49 PM

## 2015-06-28 NOTE — Progress Notes (Signed)
Nutrition Follow-up  DOCUMENTATION CODES:   Severe malnutrition in context of chronic illness  INTERVENTION:  Meals and snacks: Cater to pt preferences Nutrition Supplement Therapy: Recommend Ensure Enlive po BID, each supplement provides 350 kcal and 20 grams of protein    NUTRITION DIAGNOSIS:   Inadequate oral intake related to altered GI function as evidenced by NPO status, being addressed with starting on liquids and supplement    GOAL:   Patient will meet greater than or equal to 90% of their needs    MONITOR:    (Energy intake, Digestive system)  REASON FOR ASSESSMENT:   Malnutrition Screening Tool    ASSESSMENT:      Pt disimpacted by GI   Current Nutrition: ate 100% of full liquids this am per chart, pt extremely hard of hearing   Gastrointestinal Profile: Last BM: 8/19 this am  Noted BM times 3 earlier this am   Medications: reviewed  Electrolyte/Renal Profile and Glucose Profile:   Recent Labs Lab 06/26/15 1328 06/27/15 0558  NA 136 138  K 4.6 4.4  CL 100* 104  CO2 23 26  BUN 32* 37*  CREATININE 1.69* 1.68*  CALCIUM 9.3 8.6*  GLUCOSE 178* 44*   Protein Profile:  Recent Labs Lab 06/26/15 1328  ALBUMIN 3.9      Weight Trend since Admission: Filed Weights   06/26/15 1321 06/27/15 0137  Weight: 120 lb (54.432 kg) 116 lb 12.8 oz (52.98 kg)      Diet Order:  Diet full liquid Room service appropriate?: Yes; Fluid consistency:: Thin Diet - low sodium heart healthy  Skin:   pressure ulcer stage II   Height:   Ht Readings from Last 1 Encounters:  06/27/15  (1.727 m)    Weight:   Wt Readings from Last 1 Encounters:  06/27/15 116 lb 12.8 oz (52.98 kg)     BMI:  Body mass index is 17.76 kg/(m^2).  Estimated Nutritional Needs:   Kcal:  BEE 1349 kcals (IF 1.1-1.3, AF 1.2) 1780-2104 kcals/d  Protein:  (1.2-1.5 g/kg) 84-105 g/d   Fluid:  (30-52ml/kg) 2100-2447ml/d  EDUCATION NEEDS:   No education needs  identified at this time  MODERATE Care Level  Atanacio Melnyk B. Freida Busman, RD, LDN [phone] (pager)

## 2015-06-28 NOTE — Progress Notes (Signed)
Called Dr. Imogene Burn to report elevated troponins of 0.33. Dr. Imogene Burn said he was going to put in orders.

## 2015-06-28 NOTE — Progress Notes (Signed)
ANTICOAGULATION CONSULT NOTE - Initial Consult  Pharmacy Consult for Heparin Indication: chest pain/ACS  No Known Allergies  Patient Measurements: Height:  (172.7 cm) Weight: 116 lb 12.8 oz (52.98 kg) IBW/kg (Calculated) : 68.4   Vital Signs: BP: 104/48 mmHg (08/19 1400) Pulse Rate: 90 (08/19 1400)  Labs:  Recent Labs  06/26/15 1328 06/27/15 0558 06/28/15 1442  HGB 10.8* 9.2*  --   HCT 33.8* 27.9*  --   PLT 204 150  --   CREATININE 1.69* 1.68*  --   TROPONINI  --   --  0.33*    Estimated Creatinine Clearance: 23.2 mL/min (by C-G formula based on Cr of 1.68).   Medical History: Past Medical History  Diagnosis Date  . CAD (coronary artery disease)   . Diverticulitis   . GERD (gastroesophageal reflux disease)   . Hyperlipidemia   . Hypertension   . BPH (benign prostatic hypertrophy)   . Diabetes mellitus   . DJD (degenerative joint disease)     lumbar spine  . IBS (irritable bowel syndrome)   . Stroke   . Depression 2012  . Acute MI 09/2012  . Chronic kidney disease     ACUTE RENAL FAILURE  . Foley catheter in place 05/09/15    for 1 year per family    Medications:  Scheduled:  . aspirin EC  81 mg Oral Daily  . ciprofloxacin  400 mg Intravenous Q24H  . citalopram  10 mg Oral Daily  . clopidogrel  75 mg Oral Daily  . docusate sodium  100 mg Oral BID  . feeding supplement (ENSURE ENLIVE)  237 mL Oral BID BM  . finasteride  5 mg Oral Daily  . heparin  5,000 Units Subcutaneous 3 times per day  . insulin aspart  0-9 Units Subcutaneous Q6H   Infusions:  . dextrose 5 % and 0.9% NaCl 75 mL/hr at 06/28/15 0640  . heparin     PRN: acetaminophen **OR** acetaminophen, ipratropium-albuterol, morphine injection, ondansetron (ZOFRAN) IV  Assessment: 81 y/o M with fecal impaction developing chest pain and elevated troponin. Patient has been receiving heparin [place] so will not need bolus.   Goal of Therapy:  Heparin level 0.3-0.7 units/ml Monitor platelets  by anticoagulation protocol: Yes   Plan:  Start heparin infusion at 650 units/hr Check anti-Xa level in 8 hours and daily while on heparin Continue to monitor H&H and platelets  Luisa Hart D 06/28/2015,3:59 PM

## 2015-06-29 ENCOUNTER — Encounter: Payer: Self-pay | Admitting: Physician Assistant

## 2015-06-29 DIAGNOSIS — I5021 Acute systolic (congestive) heart failure: Secondary | ICD-10-CM

## 2015-06-29 LAB — URINE CULTURE: SPECIAL REQUESTS: NORMAL

## 2015-06-29 LAB — CBC
HEMATOCRIT: 30.3 % — AB (ref 40.0–52.0)
Hemoglobin: 10 g/dL — ABNORMAL LOW (ref 13.0–18.0)
MCH: 32.1 pg (ref 26.0–34.0)
MCHC: 32.9 g/dL (ref 32.0–36.0)
MCV: 97.5 fL (ref 80.0–100.0)
PLATELETS: 145 10*3/uL — AB (ref 150–440)
RBC: 3.11 MIL/uL — ABNORMAL LOW (ref 4.40–5.90)
RDW: 14.9 % — AB (ref 11.5–14.5)
WBC: 9.3 10*3/uL (ref 3.8–10.6)

## 2015-06-29 LAB — TROPONIN I: TROPONIN I: 2.5 ng/mL — AB (ref <0.031)

## 2015-06-29 LAB — GLUCOSE, CAPILLARY
GLUCOSE-CAPILLARY: 82 mg/dL (ref 65–99)
GLUCOSE-CAPILLARY: 227 mg/dL — AB (ref 65–99)
Glucose-Capillary: 135 mg/dL — ABNORMAL HIGH (ref 65–99)
Glucose-Capillary: 164 mg/dL — ABNORMAL HIGH (ref 65–99)

## 2015-06-29 LAB — HEPARIN LEVEL (UNFRACTIONATED)
Heparin Unfractionated: 0.69 IU/mL (ref 0.30–0.70)
Heparin Unfractionated: 0.49 IU/mL (ref 0.30–0.70)

## 2015-06-29 MED ORDER — HEPARIN SODIUM (PORCINE) 5000 UNIT/ML IJ SOLN
5000.0000 [IU] | Freq: Three times a day (TID) | INTRAMUSCULAR | Status: DC
  Administered 2015-06-29 – 2015-06-30 (×3): 5000 [IU] via SUBCUTANEOUS
  Filled 2015-06-29 (×3): qty 1

## 2015-06-29 MED ORDER — METOPROLOL TARTRATE 25 MG PO TABS: 12.5000 mg | ORAL_TABLET | Freq: Two times a day (BID) | ORAL | Status: DC

## 2015-06-29 MED ORDER — METOPROLOL TARTRATE 25 MG PO TABS
12.5000 mg | ORAL_TABLET | Freq: Two times a day (BID) | ORAL | Status: DC
  Administered 2015-06-29 – 2015-06-30 (×2): 12.5 mg via ORAL
  Filled 2015-06-29 (×2): qty 1

## 2015-06-29 MED ORDER — SENNOSIDES-DOCUSATE SODIUM 8.6-50 MG PO TABS
1.0000 | ORAL_TABLET | Freq: Two times a day (BID) | ORAL | Status: DC
  Administered 2015-06-29: 1 via ORAL
  Filled 2015-06-29: qty 1

## 2015-06-29 NOTE — Progress Notes (Signed)
Alert and oriented with some mild memory impairment at times. Ventricular paced on tele. No complaints of any pain. Heparin drip stopped and started on subQ. Foley is still intact. Patient has had a few watery stools today due to being impacted. No complaints of abdominal discomfort. Will continue to monitor.

## 2015-06-29 NOTE — Plan of Care (Signed)
Problem: Problem: Skin/Wound Progression Goal: HEALING PRESSURE ULCER Outcome: Progressing Stage 1 on sacral area, implementing q2 turns

## 2015-06-29 NOTE — Progress Notes (Signed)
PT Cancellation Note  Patient Details Name: Andres [name] MRN: [id number] DOB: Oct 01, 1926   Cancelled Treatment:    Reason Eval/Treat Not Completed: Medical issues which prohibited therapy;Patient not medically ready. Chart reviewed. Pt being followed by cardiology to assess MI v demand ischemia. Spoke to cardiology who suggests waiting until next set of troponin labs are seen to be down-trending. Will attempts at later date/time as patient is medically appropriate.    Anica Alcaraz C 06/29/2015, 8:27 AM  8:29 AM  Rosamaria Lints, PT, DPT [place] License # [id number]

## 2015-06-29 NOTE — Consult Note (Signed)
Cardiology Consultation Note  Patient ID: Andres [name], MRN: [id number], DOB/AGE: Sep 28, 1926 81 y.o. Admit date: 06/26/2015   Date of Consult: 06/29/2015 Primary Physician: Tillman Abide, MD Primary Cardiologist: Dr. Mariah Milling, MD Primary Electrophysiologist: Dr. Graciela Husbands, MD  Chief Complaint: Nausea and vomiting Reason for Consult: Elevated troponin   HPI: 81 year old male with CAD with remote NSTEMI on 09/19/2012 s/p stenting to ostial LCx with residual diease as below, diabetes, hypertension, chronic renal insufficiency, history of CVA, and complete heart block s/p MDT pacemaker placement who was recently admitted from 8/2 to 8/5 for metabolic encephalopathy in the setting of a UTI presented to [facility] on 8/17 with 2 days of N/V in the setting of a fecal impaction. Troponin has trended up on 8/19 into 8/20 from 0.33-->2.50. Cardiology was consulted for further evaluation.     He has known CAD dating back to cardiac cath in 2001 that was described. He was admitted to the hospital on 09/19/2012 with a NSTEMI. Cardiac cath showed occluded mid LAD, heavily calcified left circumflex with severe ostial disease also occluded nondominant RCA. Stent was placed to his ostial left circumflex. Unable to stent the AV groove circumflex. Echo during that admission showed EF 50-55%, mild LVH, and PPM along RV. No ischemic evaluations since 2013.      He is currently staying at [facility]. His son reports that he is not getting out of bed much. Not walking with a walker get. Still with indwelling Foley catheter. He appears depressed with low motivation. The patient complains of sacral discomfort. Eating more at [facility]. Still losing weight overall. He denies any significant chest pain since the stent placement. Patient reports fall prior to hospitalization leading to his cardiac evaluation.  He presented to [facility] on 8/17 with 2 day history of nausea and vomiting. No chest pain, SOB, palpitations,  diaphoresis, presyncope, or syncope. He had not had a BM for >1 week. He lives a fairly sedentary lifestyle as above and is very HOH.   Upon his arrival ED attempt to dislodge his fecal impaction was unsuccessful. Administration of an enema also did not resolve the patient's impaction. Patient also has a chronic indwelling Foley catheter for BPH with urinary retention. He was also noted to have a UTI on labs in the ED. He was started on IV antibiotics and enema. On 8/18 he was hypotensive in the 80s/40s and required NS bolus. He otherwise did not have complaints. On 8/19 he reported chest tightness and SOB, thus troponins were cycled and found to be 0.33-->2.03-->2.50. His O2 sats dropped to 70% on room air and required 4L via [place]. He was given IV Lasix 40 mg x 1 dose. Recent echo done during July 2016 admission showed an EF of 45-50%, technically limited study with poor windows with evidence of valvular disease. Study was read by outside group. CXR showed pulmonary edema. O2 was decreased to 2L following IV Lasix. His symptoms improved. His fecal impaction was successfully resolved after digital disimpaction by Dr. Mechele Collin.   He now states he is breathing back to baseline at room air. His no longer has any chest tightness. He is sleeping comfortably in his bed upon arriving to his room this morning.     Past Medical History  Diagnosis Date  . CAD (coronary artery disease)     a. NSTEMI 09/19/2012 s/p PCI ostial LCx,   . Diverticulitis   . GERD (gastroesophageal reflux disease)   . Hyperlipidemia   . Hypertension   .  BPH (benign prostatic hypertrophy)   . Diabetes mellitus   . DJD (degenerative joint disease)     lumbar spine  . IBS (irritable bowel syndrome)   . Stroke   . Depression 2012  . Acute MI 09/2012  . Chronic kidney disease     ACUTE RENAL FAILURE  . Foley catheter in place 05/09/15    for 1 year per family  . Complete heart block     a. s/p PPM       Most Recent Cardiac  Studies: Echo 05/12/2015:  Study Conclusions  - Left ventricle: The cavity size was normal. Systolic function was normal. Wall motion was normal; there were no regional wall motion abnormalities. - Tricuspid valve: There was moderate regurgitation.  Impressions:  - technically limited study Probably normal left ventricular function Apex is not well visualized Ejection fraction probably between 45 and 50% Pacing lead in the right ventricle evidence of valvular disease.  Read by outside group.    Surgical History:  Past Surgical History  Procedure Laterality Date  . Angioplasty  1995  . Doppler echocardiography  11/1999    syncope  . Pacemaker insertion      Pacer/cath  (ef 65%)  1/01 -  guidant Discovery II DR pulse generator  . Prostate surgery  07/2003  . Esophagogastroduodenoscopy  07/2005    neg  . Cardiac catheterization  09/2012    NSTEMI/STENT PLACED  . Insert / replace / remove pacemaker      [id number]     Home Meds: Prior to Admission medications   Medication Sig Start Date End Date Taking? Authorizing Provider  acetaminophen (TYLENOL) 325 MG tablet Take 2 tablets (650 mg total) by mouth every 6 (six) hours as needed for mild pain (or Fever >/= 101). 06/14/15  Yes Ramonita Lab, MD  aspirin EC 81 MG tablet Take 81 mg by mouth daily.   Yes Historical Provider, MD  atorvastatin (LIPITOR) 10 MG tablet Take 10 mg by mouth daily.    Yes Historical Provider, MD  citalopram (CELEXA) 10 MG tablet Take 10 mg by mouth daily.   Yes Historical Provider, MD  clopidogrel (PLAVIX) 75 MG tablet Take 75 mg by mouth daily.   Yes Historical Provider, MD  docusate sodium (COLACE) 100 MG capsule Take 100 mg by mouth 2 (two) times daily as needed for mild constipation.    Yes Historical Provider, MD  feeding supplement, ENSURE ENLIVE, (ENSURE ENLIVE) LIQD Take 237 mLs by mouth 3 (three) times daily with meals. 06/14/15  Yes Ramonita Lab, MD  finasteride (PROSCAR) 5 MG tablet  Take 5 mg by mouth daily.   Yes Historical Provider, MD  fluticasone (FLONASE) 50 MCG/ACT nasal spray Place 2 sprays into the nose daily. Patient taking differently: Place 2 sprays into the nose daily as needed for rhinitis.  05/10/13  Yes Karie Schwalbe, MD  glipiZIDE (GLUCOTROL XL) 5 MG 24 hr tablet Take 5 mg by mouth daily.   Yes Historical Provider, MD  metFORMIN (GLUCOPHAGE) 1000 MG tablet Take 500 mg by mouth 2 (two) times daily.   Yes Historical Provider, MD  metoprolol tartrate (LOPRESSOR) 25 MG tablet Take 0.5 tablets (12.5 mg total) by mouth 2 (two) times daily. Patient taking differently: Take 25 mg by mouth 2 (two) times daily.  05/14/15  Yes Srikar Sudini, MD  Multiple Vitamin (MULTIVITAMIN WITH MINERALS) TABS tablet Take 1 tablet by mouth daily. 05/10/15  Yes Srikar Sudini, MD  nitroGLYCERIN (NITROSTAT) 0.4 MG SL tablet  Place 0.4 mg under the tongue every 5 (five) minutes as needed for chest pain.    Yes Historical Provider, MD  saccharomyces boulardii (FLORASTOR) 250 MG capsule Take 1 capsule (250 mg total) by mouth 2 (two) times daily. 06/14/15  Yes Ramonita Lab, MD  triamcinolone cream (KENALOG) 0.1 % Apply 1 application topically 2 (two) times daily as needed. For itchy areas Patient taking differently: Apply 1 application topically 2 (two) times daily as needed (for dry skin).  10/26/13  Yes Karie Schwalbe, MD  albuterol (PROVENTIL) (2.5 MG/3ML) 0.083% nebulizer solution Take 3 mLs (2.5 mg total) by nebulization every 2 (two) hours as needed for wheezing. Patient not taking: Reported on 06/11/2015 05/10/15   Milagros Loll, MD  bisacodyl (BISACODYL) 5 MG EC tablet Take 1 tablet (5 mg total) by mouth daily as needed for moderate constipation. 06/28/15   Shaune Pollack, MD  cephALEXin (KEFLEX) 500 MG capsule Take 1 capsule (500 mg total) by mouth every 12 (twelve) hours. Patient not taking: Reported on 06/26/2015 06/14/15   Ramonita Lab, MD    Inpatient Medications:  . aspirin EC  81 mg Oral Daily    . ciprofloxacin  400 mg Intravenous Q24H  . citalopram  10 mg Oral Daily  . clopidogrel  75 mg Oral Daily  . docusate sodium  100 mg Oral BID  . feeding supplement (ENSURE ENLIVE)  237 mL Oral BID BM  . finasteride  5 mg Oral Daily  . insulin aspart  0-9 Units Subcutaneous Q6H  . sodium chloride  3 mL Intravenous Q12H   . heparin 650 Units/hr (06/28/15 1900)    Allergies: No Known Allergies  Social History   Social History  . Marital Status: Widowed    Spouse Name: N/A  . Number of Children: 5  . Years of Education: N/A   Occupational History  . 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 Smoking status: Former Smoker    Types: Cigarettes  . Smokeless tobacco: Never Used  . Alcohol Use: No     Comment: heavy in the past  . Drug Use: No  . Sexual Activity: No   Other Topics Concern  . Not on file   Social History Narrative   Lives with son and DIL   Son is health care POA   He would accept attempts at resuscitation   Not sure about tube feeds---but probably wouldn't want this     Family History  Problem Relation Age of Onset  . Diabetes Sister      Review of Systems: Review of Systems  Unable to perform ROS: other  [facility]  Labs:  Recent Labs  06/28/15 1442 06/28/15 2023 06/29/15 0139  TROPONINI 0.33* 2.03* 2.50*   Lab Results  Component Value Date   WBC 9.3 06/29/2015   HGB 10.0* 06/29/2015   HCT 30.3* 06/29/2015   MCV 97.5 06/29/2015   PLT 145* 06/29/2015    Recent Labs Lab 06/26/15 1328 06/27/15 0558  NA 136 138  K 4.6 4.4  CL 100* 104  CO2 23 26  BUN 32* 37*  CREATININE 1.69* 1.68*  CALCIUM 9.3 8.6*  PROT 7.7  --   BILITOT 0.7  --   ALKPHOS 86  --   ALT 12*  --   AST 26  --   GLUCOSE 178* 44*   Lab Results  Component Value Date   CHOL 103 06/14/2015   HDL 27* 06/14/2015  LDLCALC 53 06/14/2015   TRIG 115 06/14/2015   No results found for: DDIMER  Radiology/Studies:  Ct Abdomen Pelvis Wo  Contrast  06/26/2015   CLINICAL DATA:  Constipation, nausea, vomiting since last night  EXAM: CT ABDOMEN AND PELVIS WITHOUT CONTRAST  TECHNIQUE: Multidetector CT imaging of the abdomen and pelvis was performed following the standard protocol without IV contrast.  COMPARISON:  None.  FINDINGS: Lower chest: Bilateral calcified pleural plaques. Small right pleural effusion. Right basilar atelectasis. Small right pericardial calcification.  Hepatobiliary: Normal liver. Punctate calcification in the left hepatic lobe likely reflecting sequela prior granulomatous disease. Prior cholecystectomy.  Pancreas: Normal.  Spleen: Normal.  Adrenals/Urinary Tract: Normal adrenal glands. 2 hypodense, fluid attenuating right renal mass is most consistent with cysts with the largest measuring 19 mm. No obstructive uropathy. Bilateral renal vascular calcifications. Decompressed bladder with a Foley catheter present.  Stomach/Bowel: Mild distension of the stomach. No bowel obstruction. Rectal fecal impaction. Scattered diverticular disease without evidence acute diverticulitis. No abdominal or pelvic free fluid. No pneumatosis, pneumoperitoneum or portal venous gas.  Vascular/Lymphatic: Infrarenal abdominal aortic ectasia measuring 2.7 cm in AP diameter. Abdominal aortic atherosclerosis. No abdominal or pelvic lymphadenopathy.  Other: No fluid collection or hematoma.  Musculoskeletal: No lytic or sclerotic osseous lesion. No acute osseous abnormality. Bilateral facet arthropathy throughout the lumbar spine.  IMPRESSION: 1. Rectal fecal impaction. 2. Diverticulosis without evidence of diverticulitis. 3. Infrarenal abdominal aortic ectasia. Ectatic abdominal aorta at risk for aneurysm development. Recommend followup by ultrasound in 5 years. This recommendation follows ACR consensus guidelines: White Paper of the ACR Incidental Findings Committee II on Vascular Findings. J Am Coll Radiol 2013; 10:789-794.   Electronically Signed   By:  Elige Ko   On: 06/26/2015 18:57   Dg Abd 1 View  06/26/2015   CLINICAL DATA:  Abdominal pain and vomiting for 1 day.  EXAM: ABDOMEN - 1 VIEW  COMPARISON:  03/04/2013  FINDINGS: Prior cholecystectomy. Calcifications project over both renal shadows, shown on prior CT to represent vascular calcifications. Nonobstructive bowel gas pattern. No organomegaly or free air. No acute bony abnormality.  IMPRESSION: No acute findings.   Electronically Signed   By: Charlett Nose M.D.   On: 06/26/2015 13:59   Ct Head Wo Contrast  06/11/2015   CLINICAL DATA:  Altered mental status.  Generalized weakness.  EXAM: CT HEAD WITHOUT CONTRAST  TECHNIQUE: Contiguous axial images were obtained from the base of the skull through the vertex without intravenous contrast.  COMPARISON:  03/02/2013  FINDINGS: There is no intracranial hemorrhage, mass or evidence of acute infarction. There is no extra-axial fluid collection. There is moderately severe cerebral and cerebellar atrophy. There is white matter hypodensity consistent with chronic microvascular disease. There are chronic lacunar infarctions involving the left caudate, left anterior lentiform nuclei, right subinsular region. No interval change is evident.  There is chronic opacification of the right maxillary sinus with associated bony sclerosis. No bony destruction is evident. This is also unchanged.  IMPRESSION: Moderately severe generalized atrophy and chronic microvascular disease. Chronic lacunar infarctions in the basal ganglia. No acute intracranial findings.  Chronic right maxillary sinus opacification.   Electronically Signed   By: Ellery Plunk M.D.   On: 06/11/2015 21:11   Dg Chest Port 1 View  06/28/2015   CLINICAL DATA:  Shortness of breath.  Nausea and vomiting.  EXAM: PORTABLE CHEST - 1 VIEW  COMPARISON:  05/12/2015 and 03/02/2013  FINDINGS: The patient has hazy bilateral diffuse pulmonary infiltrates with  Kerley B-lines the bases consistent with pulmonary  edema. Heart size and pulmonary vascularity are normal. Calcification in the arch of the aorta. Pacemaker in place. No acute osseous abnormality.  No appreciable pleural effusions. Pleural calcifications bilaterally.  IMPRESSION: Bilateral pulmonary edema. Pleural calcifications suggest previous asbestos exposure.   Electronically Signed   By: Francene Boyers M.D.   On: 06/28/2015 13:39    EKG: Paced, 88 bpm   Weights: Filed Weights   06/26/15 1321 06/27/15 0137  Weight: 120 lb (54.432 kg) 116 lb 12.8 oz (52.98 kg)     Physical Exam: Blood pressure 122/57, pulse 80, temperature 99.1 F (37.3 C), temperature source Oral, resp. rate 18, height 5\' 8"  (1.727 m), weight 116 lb 12.8 oz (52.98 kg), SpO2 95 %. Body mass index is 17.76 kg/(m^2). General: Well developed, well nourished, in no acute distress. Very HOH.  Head: Normocephalic, atraumatic, sclera non-icteric, no xanthomas, nares are without discharge.  Neck: Negative for carotid bruits. JVD not elevated. Lungs: Clear bilaterally to auscultation without wheezes, rales, or rhonchi. Breathing is unlabored. Heart: RRR with S1 S2. II/VI systolic murmurs LUSB. No rubs, or gallops appreciated. Abdomen: Soft, non-tender, non-distended with normoactive bowel sounds. No hepatomegaly. No rebound/guarding. No obvious abdominal masses. Msk:  Strength and tone appear normal for age. Extremities: No clubbing or cyanosis. No edema.  Distal pedal pulses are 2+ and equal bilaterally. Neuro: Alert and oriented X 3. No facial asymmetry. No focal deficit. Moves all extremities spontaneously. Psych:  Responds to questions appropriately with a normal affect.    Assessment and Plan:  80 year old male with CAD with remote NSTEMI on 09/19/2012 s/p stenting to ostial LCx with residual diease as below, diabetes, hypertension, chronic renal insufficiency, history of CVA, and complete heart block s/p MDT pacemaker placement who was recently admitted from 8/2 to 8/5  for metabolic encephalopathy in the setting of a UTI presented to [facility] on 8/17 with 2 days of N/V in the setting of a fecal impaction. Troponin has trended up on 8/19 into 8/20 from 0.33-->2.50.  1. NSTEMI vs demand ischemia in the setting of flash pulmonary edema/CAD s/p remote stenting with residual disease as above: -Troponin has trended up to 2.50 this morning, continue to cycle until troponin peaks and trends downwards -On heparin gtt, continue -He has known residual coronary disease by cardiac cath in 2013, no ischemic evaluations since, lives a fairly sedentary lifestyle, mostly bed bound   -Uncertain if he would be a strong cardiac cath candidate at this time given he appears quite frail and is very hard of hearing. He is unable to tell me if he has fallen increasing the concern for dementia as well  -Would need to discuss with family their wishes regarding intervention vs medical management  -Aspirin and Plavix  -Continue Lipitor and Lopressor 12.5 mg bid  2. History of flash pulmonary edema/acute on chronic diastolic CHF: -In the setting of NS bolus for hypotension  -Resolved s/p IV Lasix 20 mg x 1 -Off supplemental O2 currently   3. Nausea and vomiting: -In the setting of fecal impaction -Resolved -Has 2 apple pies from [employer] on his tray -Per IM  4. Fecal impaction: -S/p disimpaction per GI  5. HTN: -Initially soft leading to holding of meds -Improved -Restart low dose BB  6. CKD stage III: -Stable   Signed, Eula Listen, PA-C Pager: [phone] 06/29/2015, 7:56 AM  Patient seen, examined. Available data reviewed. Agree with findings, assessment, and plan as outlined by  Eula Listen, PA-C. The patient is independently interviewed and examined. He is a pleasant, frail elderly gentleman in no acute distress. He is very hard of hearing. Lung fields are clear. Heart sounds are distant without murmur or gallop. The abdomen is now soft and nontender. There is no  peripheral edema. Jugular venous pressure is normal. I have reviewed laboratory and radiographic data. It appears that he developed flash pulmonary edema yesterday after fluids had been administered for treatment of his medical illness. He responded well to a single dose of IV Lasix. He is in no distress whatsoever this morning. LVEF has been in the range of 45%. He likely had an event related to demand ischemia in the setting of his known coronary artery disease. The patient denies any chest pain at home and he has had no chest pain today. He is a frail, elderly man and I do not think he is a candidate for any invasive cardiac evaluation. I would recommend medical therapy for known CAD. He continues on dual antiplatelets therapy with aspirin and Plavix. Will add metoprolol at a low dose. His volume status looks good at present and I do not think he requires diarrhetic therapy. He would be at high risk of acute kidney injury/dehydration at home. I agree that his troponin should be cycled until we see a flat/downward trend. We will change his IV heparin to subcutaneous DVT prophylaxis dosing. Will check a metabolic panel in the morning since he has been diuresed.  Tonny Bollman, M.D. 06/29/2015 12:23 PM

## 2015-06-29 NOTE — Plan of Care (Signed)
Problem: Consults Goal: Chest Pain Patient Education (See Patient Education module for education specifics.)  Outcome: Not Progressing Diagnosis not confirmed by MD  Problem: Phase II Progression Outcomes Goal: If positive for MI, change to MI Path Outcome: Not Progressing Diagnosis not yet confirmed by MD

## 2015-06-29 NOTE — Progress Notes (Signed)
ANTICOAGULATION CONSULT NOTE - Initial Consult  Pharmacy Consult for Heparin Indication: chest pain/ACS  No Known Allergies  Patient Measurements: Height:  (172.7 cm) Weight: 116 lb 12.8 oz (52.98 kg) IBW/kg (Calculated) : 68.4   Vital Signs: Temp: 99.1 F (37.3 C) (08/19 1938) Temp Source: Oral (08/19 1938) BP: 122/57 mmHg (08/19 1938) Pulse Rate: 80 (08/19 1938)  Labs:  Recent Labs  06/26/15 1328 06/27/15 0558 06/28/15 1442 06/28/15 1709 06/28/15 2023 06/29/15 0139  HGB 10.8* 9.2*  --   --   --  10.0*  HCT 33.8* 27.9*  --   --   --  30.3*  PLT 204 150  --   --   --  145*  APTT  --   --   --  57*  --   --   LABPROT  --   --   --  14.9  --   --   INR  --   --   --  1.15  --   --   HEPARINUNFRC  --   --   --   --   --  0.69  CREATININE 1.69* 1.68*  --   --   --   --   TROPONINI  --   --  0.33*  --  2.03*  --     Estimated Creatinine Clearance: 23.2 mL/min (by C-G formula based on Cr of 1.68).   Medical History: Past Medical History  Diagnosis Date  . CAD (coronary artery disease)   . Diverticulitis   . GERD (gastroesophageal reflux disease)   . Hyperlipidemia   . Hypertension   . BPH (benign prostatic hypertrophy)   . Diabetes mellitus   . DJD (degenerative joint disease)     lumbar spine  . IBS (irritable bowel syndrome)   . Stroke   . Depression 2012  . Acute MI 09/2012  . Chronic kidney disease     ACUTE RENAL FAILURE  . Foley catheter in place 05/09/15    for 1 year per family    Medications:  Scheduled:  . aspirin EC  81 mg Oral Daily  . ciprofloxacin  400 mg Intravenous Q24H  . citalopram  10 mg Oral Daily  . clopidogrel  75 mg Oral Daily  . docusate sodium  100 mg Oral BID  . feeding supplement (ENSURE ENLIVE)  237 mL Oral BID BM  . finasteride  5 mg Oral Daily  . insulin aspart  0-9 Units Subcutaneous Q6H  . sodium chloride  3 mL Intravenous Q12H   Infusions:  . heparin 650 Units/hr (06/28/15 1900)   PRN: acetaminophen **OR**  acetaminophen, ipratropium-albuterol, morphine injection, ondansetron (ZOFRAN) IV, sodium chloride  Assessment: 81 y/o M with fecal impaction developing chest pain and elevated troponin. Patient has been receiving heparin [place] so will not need bolus.   Goal of Therapy:  Heparin level 0.3-0.7 units/ml Monitor platelets by anticoagulation protocol: Yes   Plan:  First heparin level is therapeutic. Will check again in 8 hours and if therapeutic will switch to daily monitoring.  Carola Frost, Pharm.D. Clinical Pharmacist 06/29/2015,2:36 AM

## 2015-06-29 NOTE — Consult Note (Signed)
Pt denies chest pain.  Abd flat and non tender.  Pt wants regular food, will order a mechanical soft diet.  Also put on stool softeners with senna.

## 2015-06-29 NOTE — Progress Notes (Signed)
Notified physician Betti Cruz) of elevated troponin, no new orders.

## 2015-06-29 NOTE — Progress Notes (Signed)
Patient ID: Andres HEARN, male   DOB: Jan 09, 1926, 81 y.o.   MRN: [id number] [facility] PROGRESS NOTE  PCP: Tillman Abide, MD  HPI/Subjective: Patient off oxygen. No complaints of chest pain or shortness of breath. Feels better than yesterday. Had some diarrhea last night after disimpaction.  Objective: Filed Vitals:   06/29/15 1122  BP: 100/51  Pulse: 72  Temp: 98.2 F (36.8 C)  Resp: 16    Intake/Output Summary (Last 24 hours) at 06/29/15 1313 Last data filed at 06/29/15 1013  Gross per 24 hour  Intake 578.01 ml  Output   1950 ml  Net -1371.99 ml   Filed Weights   06/26/15 1321 06/27/15 0137  Weight: 54.432 kg (120 lb) 52.98 kg (116 lb 12.8 oz)    ROS: Review of Systems  Constitutional: Negative for fever and chills.  Eyes: Negative for blurred vision.  Respiratory: Negative for cough and shortness of breath.   Cardiovascular: Negative for chest pain.  Gastrointestinal: Positive for diarrhea. Negative for nausea, vomiting, abdominal pain and constipation.  Genitourinary: Negative for dysuria.  Musculoskeletal: Negative for joint pain.  Neurological: Negative for dizziness and headaches.   Exam: Physical Exam  HENT:  Nose: No mucosal edema.  Mouth/Throat: No oropharyngeal exudate or posterior oropharyngeal edema.  Eyes: Conjunctivae, EOM and lids are normal. Pupils are equal, round, and reactive to light.  Neck: No JVD present. Carotid bruit is not present. No edema present. No thyroid mass and no thyromegaly present.  Cardiovascular: S1 normal and S2 normal.  Exam reveals no gallop.   No murmur heard. Pulses:      Dorsalis pedis pulses are 2+ on the right side, and 2+ on the left side.  Respiratory: No respiratory distress. He has no wheezes. He has no rhonchi. He has no rales.  GI: Soft. Bowel sounds are normal. There is no tenderness.  Musculoskeletal:       Right ankle: He exhibits no swelling.       Left ankle: He exhibits no swelling.   Lymphadenopathy:    He has no cervical adenopathy.  Neurological: He is alert. No cranial nerve deficit.  Skin: Skin is warm. No rash noted. Nails show no clubbing.  Psychiatric: He has a normal mood and affect.    Data Reviewed: Basic Metabolic Panel:  Recent Labs Lab 06/26/15 1328 06/27/15 0558  NA 136 138  K 4.6 4.4  CL 100* 104  CO2 23 26  GLUCOSE 178* 44*  BUN 32* 37*  CREATININE 1.69* 1.68*  CALCIUM 9.3 8.6*   Liver Function Tests:  Recent Labs Lab 06/26/15 1328  AST 26  ALT 12*  ALKPHOS 86  BILITOT 0.7  PROT 7.7  ALBUMIN 3.9   CBC:  Recent Labs Lab 06/26/15 1328 06/27/15 0558 06/29/15 0139  WBC 14.7* 11.5* 9.3  HGB 10.8* 9.2* 10.0*  HCT 33.8* 27.9* 30.3*  MCV 96.9 96.9 97.5  PLT 204 150 145*   Cardiac Enzymes:  Recent Labs Lab 06/28/15 1442 06/28/15 2023 06/29/15 0139  TROPONINI 0.33* 2.03* 2.50*    CBG:  Recent Labs Lab 06/28/15 1444 06/28/15 1808 06/29/15 0006 06/29/15 0543 06/29/15 1206  GLUCAP 277* 265* 164* 82 227*    Recent Results (from the past 240 hour(s))  Urine culture     Status: None   Collection Time: 06/26/15  4:53 PM  Result Value Ref Range Status   Specimen Description URINE, RANDOM  Final   Special Requests Normal  Final   Culture >=100,000  COLONIES/mL CANDIDA ALBICANS  Final   Report Status 06/29/2015 FINAL  Final     Studies: Dg Chest Port 1 View  06/28/2015   CLINICAL DATA:  Shortness of breath.  Nausea and vomiting.  EXAM: PORTABLE CHEST - 1 VIEW  COMPARISON:  05/12/2015 and 03/02/2013  FINDINGS: The patient has hazy bilateral diffuse pulmonary infiltrates with Kerley B-lines the bases consistent with pulmonary edema. Heart size and pulmonary vascularity are normal. Calcification in the arch of the aorta. Pacemaker in place. No acute osseous abnormality.  No appreciable pleural effusions. Pleural calcifications bilaterally.  IMPRESSION: Bilateral pulmonary edema. Pleural calcifications suggest previous  asbestos exposure.   Electronically Signed   By: Francene Boyers M.D.   On: 06/28/2015 13:39    Scheduled Meds: . aspirin EC  81 mg Oral Daily  . ciprofloxacin  400 mg Intravenous Q24H  . citalopram  10 mg Oral Daily  . clopidogrel  75 mg Oral Daily  . feeding supplement (ENSURE ENLIVE)  237 mL Oral BID BM  . finasteride  5 mg Oral Daily  . heparin subcutaneous  5,000 Units Subcutaneous 3 times per day  . insulin aspart  0-9 Units Subcutaneous Q6H  . metoprolol tartrate  12.5 mg Oral BID  . senna-docusate  1 tablet Oral BID  . sodium chloride  3 mL Intravenous Q12H    Assessment/Plan:  1. Acute respiratory failure with hypoxia- patient currently breathing comfortably on room air. 2. Acute diastolic congestive heart failure- likely fluid overload from hospitalization. Patient received a dose of Lasix and now breathing comfortably. 3. Acute myocardial infarction- likely demand ischemia from acute respiratory failure. Spoke with cardiology likely conservative management. Continue aspirin, Plavix, low-dose metoprolol if blood pressure will tolerate. Consider statin upon discharge. 4. Fecal impaction- disimpacted by gastroenterology. 5. BPH on finasteride 6. We'll get PT evaluation  Code Status:     Code Status Orders        Start     Ordered   06/27/15 0130  Full code   Continuous     06/27/15 0129     Family Communication: Son at bedside Disposition Plan: Potentially home tomorrow  Consultants:  Cardiology  Gastroenterology  Time spent: 25 minutes  Alford Highland  [facility]

## 2015-06-30 DIAGNOSIS — I248 Other forms of acute ischemic heart disease: Secondary | ICD-10-CM | POA: Insufficient documentation

## 2015-06-30 LAB — BASIC METABOLIC PANEL
Anion gap: 6 (ref 5–15)
BUN: 20 mg/dL (ref 6–20)
CO2: 30 mmol/L (ref 22–32)
CREATININE: 1.42 mg/dL — AB (ref 0.61–1.24)
Calcium: 8.9 mg/dL (ref 8.9–10.3)
Chloride: 107 mmol/L (ref 101–111)
GFR calc Af Amer: 50 mL/min — ABNORMAL LOW (ref >60)
GFR, EST NON AFRICAN AMERICAN: 43 mL/min — AB (ref >60)
Glucose, Bld: 108 mg/dL — ABNORMAL HIGH (ref 65–99)
POTASSIUM: 4.4 mmol/L (ref 3.5–5.1)
SODIUM: 143 mmol/L (ref 135–145)

## 2015-06-30 LAB — GLUCOSE, CAPILLARY
Glucose-Capillary: 100 mg/dL — ABNORMAL HIGH (ref 65–99)
Glucose-Capillary: 133 mg/dL — ABNORMAL HIGH (ref 65–99)
Glucose-Capillary: 141 mg/dL — ABNORMAL HIGH (ref 65–99)

## 2015-06-30 LAB — CBC
HCT: 30.6 % — ABNORMAL LOW (ref 40.0–52.0)
Hemoglobin: 9.9 g/dL — ABNORMAL LOW (ref 13.0–18.0)
MCH: 31.4 pg (ref 26.0–34.0)
MCHC: 32.4 g/dL (ref 32.0–36.0)
MCV: 97 fL (ref 80.0–100.0)
PLATELETS: 138 10*3/uL — AB (ref 150–440)
RBC: 3.15 MIL/uL — AB (ref 4.40–5.90)
RDW: 15 % — ABNORMAL HIGH (ref 11.5–14.5)
WBC: 8.3 10*3/uL (ref 3.8–10.6)

## 2015-06-30 LAB — TROPONIN I: TROPONIN I: 1 ng/mL — AB (ref <0.031)

## 2015-06-30 MED ORDER — METOPROLOL TARTRATE 25 MG PO TABS: 12.5000 mg | ORAL_TABLET | Freq: Two times a day (BID) | ORAL | Status: DC

## 2015-06-30 MED ORDER — SENNOSIDES-DOCUSATE SODIUM 8.6-50 MG PO TABS: 1.0000 | ORAL_TABLET | Freq: Two times a day (BID) | ORAL | Status: AC

## 2015-06-30 NOTE — Progress Notes (Signed)
Resume home health services with [facility]. V.O. Dr Purcell Nails, RN, BSN, CM on 06/30/15 @ 10:30am.

## 2015-06-30 NOTE — Progress Notes (Addendum)
Patient's son, "[name]" Shiflet, called for discharge today. No answer, RN left voice message with call back number.   Chronic foley has been changed prior to discharge, see Flowsheets.   Update: 12:26 PM Patient's son, Kedarius Aloisi called back and said he will arrive to pick up his father after 6pm tonight.

## 2015-06-30 NOTE — Evaluation (Signed)
Physical Therapy Evaluation Patient Details Name: Andres [name] MRN: [id number] DOB: Aug 09, 1926 Today's Date: 06/30/2015   History of Present Illness  Andres [name] is a 81 y.o. male who presents with nausea and vomiting for the last 2 days as well as no bowel movement for the past week. Patient is very hard of hearing, and is unable to give much history. Collateral history is taken from ED physician who spoke with family. Patient was found to have fecal impaction the ED, and attempt was made to disimpact and it was unsuccessful. Administration of an enema also did not resolve the patient's impaction. Patient also has a chronic indwelling Foley catheter for BPH with urinary retention. He was noted to have a UTI on labs in the ED here today. Hospitalists were called for admission for fecal impaction as well as UTI. PT evaluation using whiteboard for communication needs during eval due to [facility].   Clinical Impression  Pt is received semirecumbent in bed upon entry, awake, alert, and willing to participate. No acute distress noted. Pt is very HOH but responds to written questions for history. Pt is alert and pleasant.  Pt strength as screened by  functional mobility assessment presenting c moderate global weakness, which patient attributes to be near baseline, with moderately weak grip strength. Pt falls risk is high as evidenced by slow gait speed, poor forward reach, and altered posturing during transfers and mobility. Pt is very fearful of falls as he nears EOB for sitting. As it seems that patient is near baseline and is already receiving assistance with ADL at home, pt is appropriate for DC to home. Patient presenting with impairment of strength, balance, and activity tolerance, limiting ability to perform ADL and mobility tasks at  baseline level of function. Patient will benefit from skilled intervention to address the above impairments and limitations, in order to restore to prior level of function,  improve patient safety upon discharge, and to decrease falls risk.       Follow Up Recommendations Home health PT    Equipment Recommendations  None recommended by PT    Recommendations for Other Services       Precautions / Restrictions Precautions Precautions: Fall Restrictions Weight Bearing Restrictions: No      Mobility  Bed Mobility Overal bed mobility: Needs Assistance Bed Mobility: Supine to Sit;Sit to Supine     Supine to sit: Mod assist Sit to supine: Mod assist   General bed mobility comments: Pt able to assist in sccot toward [facility] c cueing. demos limited trunk strength.   Transfers Overall transfer level: Needs assistance Equipment used: Rolling walker (2 wheeled) Transfers: Sit to/from Stand Sit to Stand: Min assist         General transfer comment: three attempts, requiring min lift assist for last 20 degrees of knee extension.   Ambulation/Gait Ambulation/Gait assistance: Min guard Ambulation Distance (Feet): 25 Feet Assistive device: Rolling walker (2 wheeled)     Gait velocity interpretation: <1.8 ft/sec, indicative of risk for recurrent falls General Gait Details: once up, ambulates well c shot, choppy steps, demos safe RW use, reports that he feels good to be up and walking.   Stairs            Wheelchair Mobility    Modified Rankin (Stroke Patients Only)       Balance Overall balance assessment: No apparent balance deficits (not formally assessed) Sitting-balance support: Feet supported;No upper extremity supported Sitting balance-Leahy Scale: Poor   Postural control: Posterior lean  Standing balance support: Bilateral upper extremity supported Standing balance-Leahy Scale: Poor                               Pertinent Vitals/Pain Pain Assessment: No/denies pain    Home Living Family/patient expects to be discharged to:: Private residence Living Arrangements: Children (Son ) Available Help at Discharge:  Family;Personal care attendant Type of Home: House Home Access: Stairs to enter Entrance Stairs-Rails: Right;Left;Can reach both Entrance Stairs-Number of Steps: Pt unable to say; 3 listed at last admission.   Home Layout: Two level;Able to live on main level with bedroom/bathroom Home Equipment: Dan Humphreys - 2 wheels;Shower seat      Prior Function Level of Independence: Needs assistance   Gait / Transfers Assistance Needed: RW for household distance mobility.  ADL's / Homemaking Assistance Needed: gets help with bathing, showering, meals.         Hand Dominance        Extremity/Trunk Assessment   Upper Extremity Assessment: Generalized weakness           Lower Extremity Assessment: Generalized weakness      Cervical / Trunk Assessment: Kyphotic  Communication   Communication: HOH;Other (comment) (used white baord for com,munication. )  Cognition Arousal/Alertness: Awake/alert Behavior During Therapy: WFL for tasks assessed/performed;Flat affect Overall Cognitive Status: Within Functional Limits for tasks assessed       Memory: Decreased short-term memory              General Comments      Exercises        Assessment/Plan    PT Assessment Patient needs continued PT services  PT Diagnosis Generalized weakness   PT Problem List Decreased strength;Decreased range of motion;Decreased activity tolerance;Decreased balance;Decreased mobility;Decreased coordination  PT Treatment Interventions DME instruction;Gait training;Functional mobility training;Therapeutic activities;Stair training;Therapeutic exercise;Balance training;Patient/family education   PT Goals (Current goals can be found in the Care Plan section) Acute Rehab PT Goals Patient Stated Goal: none stated PT Goal Formulation: Patient unable to participate in goal setting    Frequency Min 2X/week   Barriers to discharge Inaccessible home environment 3 steps    Co-evaluation                End of Session Equipment Utilized During Treatment: Gait belt Activity Tolerance: Patient tolerated treatment well Patient left: in bed;with call bell/phone within reach;with bed alarm set Nurse Communication: Mobility status         Time: 1191-4782 PT Time Calculation (min) (ACUTE ONLY): 21 min   Charges:   PT Evaluation $Initial PT Evaluation Tier I: 1 Procedure     PT G Codes:        Stokes Rattigan C 08-Jul-2015, 10:43 AM  10:46 AM  Rosamaria Lints, PT, DPT [place] License # [id number]

## 2015-06-30 NOTE — Discharge Summary (Signed)
[facility] -  at [facility]   PATIENT NAME: Andres [name]    MR#:  [id number]  DATE OF BIRTH:  20-Jul-1926  DATE OF ADMISSION:  06/26/2015 ADMITTING PHYSICIAN: Oralia Manis, MD  DATE OF DISCHARGE: 06/30/2015  PRIMARY CARE PHYSICIAN: Tillman Abide, MD    ADMISSION DIAGNOSIS:  Fecal impaction [K56.41] UTI (urinary tract infection) due to urinary indwelling Foley catheter [T83.51XA, N39.0]  DISCHARGE DIAGNOSIS:  Principal Problem:   Fecal impaction Active Problems:   Type 2 diabetes mellitus with renal manifestations, controlled   Essential hypertension   Coronary atherosclerosis of native coronary artery   GERD   BPH with urinary obstruction   UTI (lower urinary tract infection)   Sacral ulcer   Pulmonary edema   SECONDARY DIAGNOSIS:   Past Medical History  Diagnosis Date  . CAD (coronary artery disease)     a. NSTEMI 09/19/2012 s/p PCI ostial LCx,   . Diverticulitis   . GERD (gastroesophageal reflux disease)   . Hyperlipidemia   . Hypertension   . BPH (benign prostatic hypertrophy)   . Diabetes mellitus   . DJD (degenerative joint disease)     lumbar spine  . IBS (irritable bowel syndrome)   . Stroke   . Depression 2012  . Acute MI 09/2012  . Chronic kidney disease     ACUTE RENAL FAILURE  . Foley catheter in place 05/09/15    for 1 year per family  . Complete heart block     a. s/p PPM     HOSPITAL COURSE:   1. Acute respiratory failure with hypoxia. This has improved. He is now breathing comfortably on room air. This was fluid overload during the hospital course. 2. Acute diastolic congestive heart failure. Likely this is more from fluid overload during the hospital course. Patient improved with 1 dose of Lasix. Continue low-dose metoprolol as outpatient. 3. Acute myocardial infarction. This was likely secondary to acute respiratory failure with fluid overload. Patient was seen in consultation by cardiology and  recommended conservative management. Patient is on aspirin and Plavix metoprolol and statin. 4. Constipation and impaction. Patient was disimpacted by gastroenterology. Placed on senna Colace combination. 5. Hyperlipidemia unspecified continue atorvastatin. 6. Hypertension essential continue usual medications 7. Diabetes mellitus can restart glipizide as outpatient hold off on metformin. 8. Chronic Foley catheter- we will change Foley catheter today. Urine culture grew out Candida which is a contamination. The patient does not have a urinary infection. 9. Weakness- physical therapy recommended home with home health.  DISCHARGE CONDITIONS:   Fair  CONSULTS OBTAINED:  Treatment Team:  Scot Jun, MD Iran Ouch, MD Phil Dopp, MD  DRUG ALLERGIES:  No Known Allergies  DISCHARGE MEDICATIONS:   Current Discharge Medication List    START taking these medications   Details  bisacodyl (BISACODYL) 5 MG EC tablet Take 1 tablet (5 mg total) by mouth daily as needed for moderate constipation. Qty: 14 tablet, Refills: 0    senna-docusate (SENOKOT-S) 8.6-50 MG per tablet Take 1 tablet by mouth 2 (two) times daily. Qty: 60 tablet, Refills: 0      CONTINUE these medications which have CHANGED   Details  metoprolol tartrate (LOPRESSOR) 25 MG tablet Take 0.5 tablets (12.5 mg total) by mouth 2 (two) times daily. Qty: 60 tablet, Refills: 0      CONTINUE these medications which have NOT CHANGED   Details  acetaminophen (TYLENOL) 325 MG tablet Take 2 tablets (650 mg total) by mouth every  6 (six) hours as needed for mild pain (or Fever >/= 101).    aspirin EC 81 MG tablet Take 81 mg by mouth daily.    atorvastatin (LIPITOR) 10 MG tablet Take 10 mg by mouth daily.     citalopram (CELEXA) 10 MG tablet Take 10 mg by mouth daily.    clopidogrel (PLAVIX) 75 MG tablet Take 75 mg by mouth daily.    feeding supplement, ENSURE ENLIVE, (ENSURE ENLIVE) LIQD Take 237 mLs by mouth 3  (three) times daily with meals. Qty: 237 mL, Refills: 12    finasteride (PROSCAR) 5 MG tablet Take 5 mg by mouth daily.    fluticasone (FLONASE) 50 MCG/ACT nasal spray Place 2 sprays into the nose daily. Qty: 16 g, Refills: 1    glipiZIDE (GLUCOTROL XL) 5 MG 24 hr tablet Take 5 mg by mouth daily.    Multiple Vitamin (MULTIVITAMIN WITH MINERALS) TABS tablet Take 1 tablet by mouth daily.    nitroGLYCERIN (NITROSTAT) 0.4 MG SL tablet Place 0.4 mg under the tongue every 5 (five) minutes as needed for chest pain.     triamcinolone cream (KENALOG) 0.1 % Apply 1 application topically 2 (two) times daily as needed. For itchy areas Qty: 30 g, Refills: 2    albuterol (PROVENTIL) (2.5 MG/3ML) 0.083% nebulizer solution Take 3 mLs (2.5 mg total) by nebulization every 2 (two) hours as needed for wheezing. Qty: 75 mL, Refills: 12      STOP taking these medications     docusate sodium (COLACE) 100 MG capsule      metFORMIN (GLUCOPHAGE) 1000 MG tablet      saccharomyces boulardii (FLORASTOR) 250 MG capsule      cephALEXin (KEFLEX) 500 MG capsule          DISCHARGE INSTRUCTIONS:   Follow-up with Dr. LetvaAlphonsus Sias week.  If you experience worsening of your admission symptoms, develop shortness of breath, life threatening emergency, suicidal or homicidal thoughts you must seek medical attention immediately by calling 911 or calling your MD immediately  if symptoms less severe.  You Must read complete instructions/literature along with all the possible adverse reactions/side effects for all the Medicines you take and that have been prescribed to you. Take any new Medicines after you have completely understood and accept all the possible adverse reactions/side effects.   Please note  You were cared for by a hospitalist during your hospital stay. If you have any questions about your discharge medications or the care you received while you were in the hospital after you are discharged, you can call  the unit and asked to speak with the hospitalist on call if the hospitalist that took care of you is not available. Once you are discharged, your primary care physician will handle any further medical issues. Please note that NO REFILLS for any discharge medications will be authorized once you are discharged, as it is imperative that you return to your primary care physician (or establish a relationship with a primary care physician if you do not have one) for your aftercare needs so that they can reassess your need for medications and monitor your lab values.    Today   CHIEF COMPLAINT:   Chief Complaint  Patient presents with  . Emesis    HISTORY OF PRESENT ILLNESS:  Andres [name]  is a 82 y.o. male with a known history of multiple medical issues presented with constipation and difficulty with bowel movements   VITAL SIGNS:  Blood pressure 110/53, pulse 62,  temperature 97.5 F (36.4 C), temperature source Oral, resp. rate 18, height 5\' 8"  (1.727 m), weight 52.98 kg (116 lb 12.8 oz), SpO2 96 %.    PHYSICAL EXAMINATION:  GENERAL:  81 y.o.-year-old patient lying in the bed with no acute distress.  EYES: Pupils equal, round, reactive to light and accommodation. No scleral icterus. Extraocular muscles intact.  HEENT: Head atraumatic, normocephalic. Oropharynx and nasopharynx clear.  NECK:  Supple, no jugular venous distention. No thyroid enlargement, no tenderness.  LUNGS: Normal breath sounds bilaterally, no wheezing, rales,rhonchi or crepitation. No use of accessory muscles of respiration.  CARDIOVASCULAR: S1, S2 normal. 2/6 systolic murmur, no rubs, or gallops.  ABDOMEN: Soft, non-tender, non-distended. Bowel sounds present. No organomegaly or mass.  EXTREMITIES: No pedal edema, cyanosis, or clubbing.  NEUROLOGIC: Muscle strength 5/5 in all extremities. Sensation intact. Patient walks with walker with physical therapy today PSYCHIATRIC: The patient is alert and oriented x 3.  SKIN:  No obvious rash, lesion, or ulcer.   DATA REVIEW:   CBC  Recent Labs Lab 06/30/15 0534  WBC 8.3  HGB 9.9*  HCT 30.6*  PLT 138*    Chemistries   Recent Labs Lab 06/26/15 1328  06/30/15 0534  NA 136  < > 143  K 4.6  < > 4.4  CL 100*  < > 107  CO2 23  < > 30  GLUCOSE 178*  < > 108*  BUN 32*  < > 20  CREATININE 1.69*  < > 1.42*  CALCIUM 9.3  < > 8.9  AST 26  --   --   ALT 12*  --   --   ALKPHOS 86  --   --   BILITOT 0.7  --   --   < > = values in this interval not displayed.  Cardiac Enzymes  Recent Labs Lab 06/30/15 0534  TROPONINI 1.00*      RADIOLOGY:  Dg Chest Port 1 View  06/28/2015   CLINICAL DATA:  Shortness of breath.  Nausea and vomiting.  EXAM: PORTABLE CHEST - 1 VIEW  COMPARISON:  05/12/2015 and 03/02/2013  FINDINGS: The patient has hazy bilateral diffuse pulmonary infiltrates with Kerley B-lines the bases consistent with pulmonary edema. Heart size and pulmonary vascularity are normal. Calcification in the arch of the aorta. Pacemaker in place. No acute osseous abnormality.  No appreciable pleural effusions. Pleural calcifications bilaterally.  IMPRESSION: Bilateral pulmonary edema. Pleural calcifications suggest previous asbestos exposure.   Electronically Signed   By: Francene Boyers M.D.   On: 06/28/2015 13:39    Management plans discussed with the patient, family and they are in agreement.  CODE STATUS:     Code Status Orders        Start     Ordered   06/27/15 0130  Full code   Continuous     06/27/15 0129      TOTAL TIME TAKING CARE OF THIS PATIENT: 35 minutes. Greater than 50% of the time spent in coordination of care in speaking with nursing staff and son on the phone.   Alford Highland M.D on 06/30/2015 at 9:14 AM  Between 7am to 6pm - Pager - [phone]  After 6pm go to [email or web address] - password [email or web address] [facility]  [facility]  Office  [phone]  CC: Primary care physician; Tillman Abide, MD

## 2015-06-30 NOTE — Progress Notes (Signed)
Pt. Discharged to home with hh via wc. Discharge instructions and medication regimen reviewed at bedside with patient's son, who is primary caregiver. Pt. son verbalizes understanding of instructions and medication regimen. Prescriptions included with discharge papers. Patient assessment unchanged from this morning. TELE and IV discontinued per policy.

## 2015-06-30 NOTE — Plan of Care (Signed)
Problem: Acute Rehab PT Goals(only PT should resolve) Goal: Patient Will Transfer Sit To/From Stand Pt will transfer sit to/from-stand with RW at [employer] without loss-of-balance to demonstrate good safety awareness for independent mobility in home.     Goal: Pt Will Ambulate Pt will ambulate with RW at Supervision using a step-through pattern and equal step length for a distances greater than 180ft to demonstrate the ability to perform safe household distance ambulation at discharge.

## 2015-06-30 NOTE — Care Management Note (Signed)
Case Management Note  Patient Details  Name: Andres [name] MRN: [id number] Date of Birth: 1926/07/18  Subjective/Objective:    Referral faxed and called to on-call nurse at [facility] for home health PT, RN, Aid. Resumption of care.                Action/Plan:   Expected Discharge Date:                  Expected Discharge Plan:     In-House Referral:     Discharge planning Services     Post Acute Care Choice:    Choice offered to:     DME Arranged:    DME Agency:     HH Arranged:    HH Agency:     Status of Service:     Medicare Important Message Given:    Date Medicare IM Given:    Medicare IM give by:    Date Additional Medicare IM Given:    Additional Medicare Important Message give by:     If discussed at Long Length of Stay Meetings, dates discussed:    Additional Comments:  Koriana Stepien A, RN 06/30/2015, 11:27 AM

## 2015-06-30 NOTE — Discharge Instructions (Addendum)
Low sodium and ADA diet. Activity as tolerated. Fall precaution. Please contact your physician for any further signs of increasing constipation.   Constipation Constipation is when a person has fewer than three bowel movements a week, has difficulty having a bowel movement, or has stools that are dry, hard, or larger than normal. As people grow older, constipation is more common. If you try to fix constipation with medicines that make you have a bowel movement (laxatives), the problem may get worse. Long-term laxative use may cause the muscles of the colon to become weak. A low-fiber diet, not taking in enough fluids, and taking certain medicines may make constipation worse.  CAUSES  1. Certain medicines, such as antidepressants, pain medicine, iron supplements, antacids, and water pills.  2. Certain diseases, such as diabetes, irritable bowel syndrome (IBS), thyroid disease, or depression.  3. Not drinking enough water.  4. Not eating enough fiber-rich foods.  5. Stress or travel.  6. Lack of physical activity or exercise.  7. Ignoring the urge to have a bowel movement.  8. Using laxatives too much.  SIGNS AND SYMPTOMS  1. Having fewer than three bowel movements a week.  2. Straining to have a bowel movement.  3. Having stools that are hard, dry, or larger than normal.  4. Feeling full or bloated.  5. Pain in the lower abdomen.  6. Not feeling relief after having a bowel movement.  DIAGNOSIS  Your health care provider will take a medical history and perform a physical exam. Further testing may be done for severe constipation. Some tests may include: 1. A barium enema X-ray to examine your rectum, colon, and, sometimes, your small intestine.  2. A sigmoidoscopy to examine your lower colon.  3. A colonoscopy to examine your entire colon. TREATMENT  Treatment will depend on the severity of your constipation and what is causing it. Some dietary treatments include drinking  more fluids and eating more fiber-rich foods. Lifestyle treatments may include regular exercise. If these diet and lifestyle recommendations do not help, your health care provider may recommend taking over-the-counter laxative medicines to help you have bowel movements. Prescription medicines may be prescribed if over-the-counter medicines do not work.  HOME CARE INSTRUCTIONS  1. Eat foods that have a lot of fiber, such as fruits, vegetables, whole grains, and beans. 2. Limit foods high in fat and processed sugars, such as french fries, hamburgers, cookies, candies, and soda.  3. A fiber supplement may be added to your diet if you cannot get enough fiber from foods.  4. Drink enough fluids to keep your urine clear or pale yellow.  5. Exercise regularly or as directed by your health care provider.  6. Go to the restroom when you have the urge to go. Do not hold it.  7. Only take over-the-counter or prescription medicines as directed by your health care provider. Do not take other medicines for constipation without talking to your health care provider first.  SEEK IMMEDIATE MEDICAL CARE IF:   You have bright red blood in your stool.   Your constipation lasts for more than 4 days or gets worse.   You have abdominal or rectal pain.   You have thin, pencil-like stools.   You have unexplained weight loss. MAKE SURE YOU:   Understand these instructions.  Will watch your condition.  Will get help right away if you are not doing well or get worse. Document Released: 07/24/2004 Document Revised: 10/31/2013 Document Reviewed: 08/07/2013 [facility] Patient Information 2015 [employer]. This information is not intended to replace advice given to you by your health care provider. Make sure you discuss any questions you have with your health care provider. Foley Catheter Care A Foley catheter is a soft, flexible tube. This tube is placed into your bladder to drain pee (urine). If you go  home with this catheter in place, follow the instructions below. TAKING CARE OF THE CATHETER 9. Wash your hands with soap and water. 10. Put soap and water on a clean washcloth.  Clean the skin where the tube goes into your body.  Clean away from the tube site.  Never wipe toward the tube.  Clean the area using a circular motion.  Remove all the soap. Pat the area dry with a clean towel. For males, reposition the skin that covers the end of the penis (foreskin). 11. Attach the tube to your leg with tape or a leg strap. Do not stretch the tube tight. If you are using tape, remove any stickiness left behind by past tape you used. 12. Keep the drainage bag below your hips. Keep it off the floor. 13. Check your tube during the day. Make sure it is working and draining. Make sure the tube does not curl, twist, or bend. 14. Do not pull on the tube or try to take it out. TAKING CARE OF THE DRAINAGE BAGS You will have a large overnight drainage bag and a small leg bag. You may wear the overnight bag any time. Never wear the small bag at night. Follow the directions below. Emptying the Drainage Bag Empty your drainage bag when it is  - full or at least 2-3 times a day. 7. Wash your hands with soap and water. 8. Keep the drainage bag below your hips. 9. Hold the dirty bag over the toilet or clean container. 10. Open the pour spout at the bottom of the bag. Empty the pee into the toilet or container. Do not let the pour spout touch anything. 11. Clean the pour spout with a gauze pad or cotton ball that has rubbing alcohol on it. 12. Close the pour spout. 13. Attach the bag to your leg with tape or a leg strap. 14. Wash your hands well. Changing the Drainage Bag Change your bag once a month or sooner if it starts to smell or look dirty.  4. Wash your hands with soap and water. 5. Pinch the rubber tube so that pee does not spill out. 6. Disconnect the catheter tube from the drainage tube at the  connection valve. Do not let the tubes touch anything. 7. Clean the end of the catheter tube with an alcohol wipe. Clean the end of a the drainage tube with a different alcohol wipe. 8. Connect the catheter tube to the drainage tube of the clean drainage bag. 9. Attach the new bag to the leg with tape or a leg strap. Avoid attaching the new bag too tightly. 10. Wash your hands well. Cleaning the Drainage Bag 8. Wash your hands with soap and water. 9. Wash the bag in warm, soapy water. 10. Rinse the bag with warm water. 11. Fill the bag with a mixture of white vinegar and water (1 cup vinegar to 1 quart warm water [.2 liter vinegar to 1 liter warm water]). Close the bag and soak it for 30 minutes in the solution. 12. Rinse the bag with warm water. 13. Hang the bag to dry with the pour spout open and hanging downward. 14.  Store the clean bag (once it is dry) in a clean plastic bag. 15. Wash your hands well. PREVENT INFECTION  Wash your hands before and after touching your tube.  Take showers every day. Wash the skin where the tube enters your body. Do not take baths. Replace wet leg straps with dry ones, if this applies.  Do not use powders, sprays, or lotions on the genital area. Only use creams, lotions, or ointments as told by your doctor.  For females, wipe from front to back after going to the bathroom.  Drink enough fluids to keep your pee clear or pale yellow unless you are told not to have too much fluid (fluid restriction).  Do not let the drainage bag or tubing touch or lie on the floor.  Wear cotton underwear to keep the area dry. GET HELP IF:  Your pee is cloudy or smells unusually bad.  Your tube becomes clogged.  You are not draining pee into the bag or your bladder feels full.  Your tube starts to leak. GET HELP RIGHT AWAY IF:  You have pain, puffiness (swelling), redness, or yellowish-white fluid (pus) where the tube enters the body.  You have pain in the belly  (abdomen), legs, lower back, or bladder.  You have a fever.  You see blood fill the tube, or your pee is pink or red.  You feel sick to your stomach (nauseous), throw up (vomit), or have chills.  Your tube gets pulled out. MAKE SURE YOU:   Understand these instructions.  Will watch your condition.  Will get help right away if you are not doing well or get worse. Document Released: 02/20/2013 Document Revised: 03/12/2014 Document Reviewed: 02/20/2013 [facility] Patient Information 2015 [place], [place]. This information is not intended to replace advice given to you by your health care provider. Make sure you discuss any questions you have with your health care provider.

## 2015-06-30 NOTE — Plan of Care (Signed)
Problem: Problem: Skin/Wound Progression Goal: HEALING PRESSURE ULCER Outcome: Progressing Stage 1 pressure ulcer to sacral area covered by foam dressing. Skin remains clean and dry upon assessment.  Problem: Consults Goal: Chest Pain Patient Education (See Patient Education module for education specifics.)  Outcome: Completed/Met Date Met:  06/30/15 Diagnosis of MI ruled out.  Problem: Phase II Progression Outcomes Goal: If positive for MI, change to MI Path Outcome: Completed/Met Date Met:  06/30/15 Diagnosis of MI ruled out.

## 2015-07-02 DIAGNOSIS — M6281 Muscle weakness (generalized): Secondary | ICD-10-CM | POA: Diagnosis not present

## 2015-07-02 DIAGNOSIS — F329 Major depressive disorder, single episode, unspecified: Secondary | ICD-10-CM | POA: Diagnosis not present

## 2015-07-02 DIAGNOSIS — N183 Chronic kidney disease, stage 3 (moderate): Secondary | ICD-10-CM | POA: Diagnosis not present

## 2015-07-02 DIAGNOSIS — I129 Hypertensive chronic kidney disease with stage 1 through stage 4 chronic kidney disease, or unspecified chronic kidney disease: Secondary | ICD-10-CM | POA: Diagnosis not present

## 2015-07-02 DIAGNOSIS — Z9181 History of falling: Secondary | ICD-10-CM | POA: Diagnosis not present

## 2015-07-02 DIAGNOSIS — N401 Enlarged prostate with lower urinary tract symptoms: Secondary | ICD-10-CM | POA: Diagnosis not present

## 2015-07-02 DIAGNOSIS — I252 Old myocardial infarction: Secondary | ICD-10-CM | POA: Diagnosis not present

## 2015-07-02 DIAGNOSIS — Z466 Encounter for fitting and adjustment of urinary device: Secondary | ICD-10-CM | POA: Diagnosis not present

## 2015-07-02 DIAGNOSIS — E1122 Type 2 diabetes mellitus with diabetic chronic kidney disease: Secondary | ICD-10-CM | POA: Diagnosis not present

## 2015-07-02 DIAGNOSIS — L89150 Pressure ulcer of sacral region, unstageable: Secondary | ICD-10-CM | POA: Diagnosis not present

## 2015-07-02 DIAGNOSIS — F0390 Unspecified dementia without behavioral disturbance: Secondary | ICD-10-CM | POA: Diagnosis not present

## 2015-07-02 DIAGNOSIS — R338 Other retention of urine: Secondary | ICD-10-CM | POA: Diagnosis not present

## 2015-07-02 DIAGNOSIS — R2681 Unsteadiness on feet: Secondary | ICD-10-CM | POA: Diagnosis not present

## 2015-07-02 DIAGNOSIS — Z7902 Long term (current) use of antithrombotics/antiplatelets: Secondary | ICD-10-CM | POA: Diagnosis not present

## 2015-07-02 DIAGNOSIS — I5042 Chronic combined systolic (congestive) and diastolic (congestive) heart failure: Secondary | ICD-10-CM | POA: Diagnosis not present

## 2015-07-02 DIAGNOSIS — E43 Unspecified severe protein-calorie malnutrition: Secondary | ICD-10-CM | POA: Diagnosis not present

## 2015-07-02 DIAGNOSIS — Z7982 Long term (current) use of aspirin: Secondary | ICD-10-CM | POA: Diagnosis not present

## 2015-07-02 DIAGNOSIS — I2510 Atherosclerotic heart disease of native coronary artery without angina pectoris: Secondary | ICD-10-CM | POA: Diagnosis not present

## 2015-07-03 LAB — GLUCOSE, CAPILLARY
GLUCOSE-CAPILLARY: 162 mg/dL — AB (ref 65–99)
Glucose-Capillary: 225 mg/dL — ABNORMAL HIGH (ref 65–99)

## 2015-07-04 ENCOUNTER — Ambulatory Visit: Payer: Medicare Other | Admitting: Internal Medicine

## 2015-07-04 DIAGNOSIS — I2510 Atherosclerotic heart disease of native coronary artery without angina pectoris: Secondary | ICD-10-CM

## 2015-07-04 DIAGNOSIS — K219 Gastro-esophageal reflux disease without esophagitis: Secondary | ICD-10-CM

## 2015-07-04 DIAGNOSIS — F0390 Unspecified dementia without behavioral disturbance: Secondary | ICD-10-CM | POA: Diagnosis not present

## 2015-07-04 DIAGNOSIS — Z7982 Long term (current) use of aspirin: Secondary | ICD-10-CM

## 2015-07-04 DIAGNOSIS — N401 Enlarged prostate with lower urinary tract symptoms: Secondary | ICD-10-CM | POA: Diagnosis not present

## 2015-07-04 DIAGNOSIS — Z7902 Long term (current) use of antithrombotics/antiplatelets: Secondary | ICD-10-CM

## 2015-07-04 DIAGNOSIS — E785 Hyperlipidemia, unspecified: Secondary | ICD-10-CM

## 2015-07-04 DIAGNOSIS — M6281 Muscle weakness (generalized): Secondary | ICD-10-CM | POA: Diagnosis not present

## 2015-07-04 DIAGNOSIS — I252 Old myocardial infarction: Secondary | ICD-10-CM

## 2015-07-04 DIAGNOSIS — Z466 Encounter for fitting and adjustment of urinary device: Secondary | ICD-10-CM

## 2015-07-04 DIAGNOSIS — Z87440 Personal history of urinary (tract) infections: Secondary | ICD-10-CM

## 2015-07-04 DIAGNOSIS — E1122 Type 2 diabetes mellitus with diabetic chronic kidney disease: Secondary | ICD-10-CM

## 2015-07-04 DIAGNOSIS — Z9181 History of falling: Secondary | ICD-10-CM

## 2015-07-04 DIAGNOSIS — M47816 Spondylosis without myelopathy or radiculopathy, lumbar region: Secondary | ICD-10-CM

## 2015-07-04 DIAGNOSIS — N183 Chronic kidney disease, stage 3 (moderate): Secondary | ICD-10-CM

## 2015-07-04 DIAGNOSIS — F329 Major depressive disorder, single episode, unspecified: Secondary | ICD-10-CM

## 2015-07-04 DIAGNOSIS — R2681 Unsteadiness on feet: Secondary | ICD-10-CM | POA: Diagnosis not present

## 2015-07-04 DIAGNOSIS — L89150 Pressure ulcer of sacral region, unstageable: Secondary | ICD-10-CM

## 2015-07-04 DIAGNOSIS — Z87891 Personal history of nicotine dependence: Secondary | ICD-10-CM

## 2015-07-04 DIAGNOSIS — M6282 Rhabdomyolysis: Secondary | ICD-10-CM | POA: Diagnosis not present

## 2015-07-04 DIAGNOSIS — K589 Irritable bowel syndrome without diarrhea: Secondary | ICD-10-CM

## 2015-07-04 DIAGNOSIS — E43 Unspecified severe protein-calorie malnutrition: Secondary | ICD-10-CM

## 2015-07-04 DIAGNOSIS — I129 Hypertensive chronic kidney disease with stage 1 through stage 4 chronic kidney disease, or unspecified chronic kidney disease: Secondary | ICD-10-CM

## 2015-07-04 DIAGNOSIS — R338 Other retention of urine: Secondary | ICD-10-CM | POA: Diagnosis not present

## 2015-07-04 DIAGNOSIS — Z8673 Personal history of transient ischemic attack (TIA), and cerebral infarction without residual deficits: Secondary | ICD-10-CM

## 2015-07-04 DIAGNOSIS — I5042 Chronic combined systolic (congestive) and diastolic (congestive) heart failure: Secondary | ICD-10-CM

## 2015-07-05 ENCOUNTER — Telehealth: Payer: Self-pay

## 2015-07-05 NOTE — Telephone Encounter (Signed)
Darl Pikes with [facility] left v/m; pt was discharged from hospital recently but no wound care orders given;requesting orders for wound care to sacrum; spoke with Darl Pikes  And pt has pressure ulcer on sacral area; Darl Pikes has not looked at due to not having other dressings to put back on; pt presently has allevyn dressing on area. Darl Pikes request cb.

## 2015-07-05 NOTE — Telephone Encounter (Signed)
Verbal order for home health to eval and treat. I can not give specific orders without knowing what the wound looks like.

## 2015-07-08 NOTE — Telephone Encounter (Signed)
Left message on machine with verbal order, advised to call back if any questions 

## 2015-07-10 ENCOUNTER — Encounter: Payer: Self-pay | Admitting: Internal Medicine

## 2015-07-10 ENCOUNTER — Ambulatory Visit: Payer: Medicare Other | Admitting: Internal Medicine

## 2015-07-10 VITALS — BP 102/60 | HR 64 | Resp 14

## 2015-07-10 DIAGNOSIS — I5032 Chronic diastolic (congestive) heart failure: Secondary | ICD-10-CM

## 2015-07-10 DIAGNOSIS — Z23 Encounter for immunization: Secondary | ICD-10-CM | POA: Diagnosis not present

## 2015-07-10 DIAGNOSIS — E1129 Type 2 diabetes mellitus with other diabetic kidney complication: Secondary | ICD-10-CM | POA: Diagnosis not present

## 2015-07-10 DIAGNOSIS — I5042 Chronic combined systolic (congestive) and diastolic (congestive) heart failure: Secondary | ICD-10-CM | POA: Diagnosis not present

## 2015-07-10 DIAGNOSIS — L89150 Pressure ulcer of sacral region, unstageable: Secondary | ICD-10-CM | POA: Diagnosis not present

## 2015-07-10 DIAGNOSIS — L89151 Pressure ulcer of sacral region, stage 1: Secondary | ICD-10-CM

## 2015-07-10 DIAGNOSIS — M6281 Muscle weakness (generalized): Secondary | ICD-10-CM | POA: Diagnosis not present

## 2015-07-10 DIAGNOSIS — N401 Enlarged prostate with lower urinary tract symptoms: Secondary | ICD-10-CM | POA: Diagnosis not present

## 2015-07-10 DIAGNOSIS — N183 Chronic kidney disease, stage 3 unspecified: Secondary | ICD-10-CM

## 2015-07-10 DIAGNOSIS — Z7902 Long term (current) use of antithrombotics/antiplatelets: Secondary | ICD-10-CM | POA: Diagnosis not present

## 2015-07-10 DIAGNOSIS — K5641 Fecal impaction: Secondary | ICD-10-CM

## 2015-07-10 DIAGNOSIS — E1122 Type 2 diabetes mellitus with diabetic chronic kidney disease: Secondary | ICD-10-CM | POA: Diagnosis not present

## 2015-07-10 DIAGNOSIS — Z7982 Long term (current) use of aspirin: Secondary | ICD-10-CM | POA: Diagnosis not present

## 2015-07-10 DIAGNOSIS — F0390 Unspecified dementia without behavioral disturbance: Secondary | ICD-10-CM | POA: Diagnosis not present

## 2015-07-10 DIAGNOSIS — E43 Unspecified severe protein-calorie malnutrition: Secondary | ICD-10-CM | POA: Diagnosis not present

## 2015-07-10 DIAGNOSIS — F39 Unspecified mood [affective] disorder: Secondary | ICD-10-CM

## 2015-07-10 DIAGNOSIS — I6930 Unspecified sequelae of cerebral infarction: Secondary | ICD-10-CM

## 2015-07-10 DIAGNOSIS — R338 Other retention of urine: Secondary | ICD-10-CM | POA: Diagnosis not present

## 2015-07-10 DIAGNOSIS — I2510 Atherosclerotic heart disease of native coronary artery without angina pectoris: Secondary | ICD-10-CM | POA: Diagnosis not present

## 2015-07-10 DIAGNOSIS — R339 Retention of urine, unspecified: Secondary | ICD-10-CM

## 2015-07-10 DIAGNOSIS — I129 Hypertensive chronic kidney disease with stage 1 through stage 4 chronic kidney disease, or unspecified chronic kidney disease: Secondary | ICD-10-CM | POA: Diagnosis not present

## 2015-07-10 DIAGNOSIS — Z9181 History of falling: Secondary | ICD-10-CM | POA: Diagnosis not present

## 2015-07-10 DIAGNOSIS — R2681 Unsteadiness on feet: Secondary | ICD-10-CM | POA: Diagnosis not present

## 2015-07-10 DIAGNOSIS — F0150 Vascular dementia without behavioral disturbance: Secondary | ICD-10-CM

## 2015-07-10 DIAGNOSIS — I6990 Unspecified sequelae of unspecified cerebrovascular disease: Secondary | ICD-10-CM

## 2015-07-10 DIAGNOSIS — E440 Moderate protein-calorie malnutrition: Secondary | ICD-10-CM

## 2015-07-10 DIAGNOSIS — F329 Major depressive disorder, single episode, unspecified: Secondary | ICD-10-CM | POA: Diagnosis not present

## 2015-07-10 DIAGNOSIS — Z466 Encounter for fitting and adjustment of urinary device: Secondary | ICD-10-CM | POA: Diagnosis not present

## 2015-07-10 DIAGNOSIS — N058 Unspecified nephritic syndrome with other morphologic changes: Secondary | ICD-10-CM

## 2015-07-10 DIAGNOSIS — I252 Old myocardial infarction: Secondary | ICD-10-CM | POA: Diagnosis not present

## 2015-07-10 MED ORDER — METOPROLOL SUCCINATE ER 25 MG PO TB24: 25.0000 mg | ORAL_TABLET | Freq: Every day | ORAL | Status: DC

## 2015-07-10 NOTE — Assessment & Plan Note (Signed)
Lab Results  Component Value Date   HGBA1C 6.4* 05/09/2015   Good control No hypoglycemia May need to try reducing the glipizide if any symptoms

## 2015-07-10 NOTE — Assessment & Plan Note (Signed)
Just not eating well Discussed boost with ice cream and encouraging food

## 2015-07-10 NOTE — Assessment & Plan Note (Signed)
Has Foley May want to consider changing every 2 weeks if any problems with drainage

## 2015-07-10 NOTE — Assessment & Plan Note (Signed)
Mild worsening since apparent recent stroke On aspirin

## 2015-07-10 NOTE — Addendum Note (Signed)
Addended by: Tillman Abide I on: 07/10/2015 04:05 PM   Modules accepted: Orders, Medications

## 2015-07-10 NOTE — Assessment & Plan Note (Signed)
Better now  Came on with fluid overload in hospital Okay now Will decrease the metoprolol and change to succinate due to hypotension

## 2015-07-10 NOTE — Assessment & Plan Note (Signed)
Now with speech problems as well Getting home care and tenous at son's home but probably better off there than at [facility] Care management may help with services to ease the caregiving burden

## 2015-07-10 NOTE — Assessment & Plan Note (Signed)
Renal function stabilized on recent hospital stay

## 2015-07-10 NOTE — Assessment & Plan Note (Signed)
Seems okay now back at son's house

## 2015-07-10 NOTE — Assessment & Plan Note (Signed)
Currently only stage 1 fortunately

## 2015-07-10 NOTE — Progress Notes (Signed)
Subjective:    Patient ID: Andres [name], male    DOB: January 10, 1926, 81 y.o.   MRN: [id number]  HPI Resuming home visits 2 recent hospitalizations Son and aide are here Reviewed meds--- meds all the same other than metoprolol being decreased to 12.5 bid Son only giving stool softener--asked him to change to senna s 2 daily Son or 1 aide give the meds He is alone at night at times --if son out of town Aides daily 12-5PM  Had UTI for first time Then delirium related to obstipation. Had diastolic CHF then related to fluid overload.  Bowels have been moving better since being home  Still bothered by the catheter Keeps full bag attached to his walker Still getting changed monthly by home health Uncomfortable at penis  Walking around the house with rolling walker Needs help with bathroom, showers and dressing   Breathing is okay No chest pain No palpitations Some dizziness Larey Seat a week ago--- after getting up on his own and trying to get to bathroom on his own  Speech problems persist Appeared suddenly about 6 weeks ago Memory is fairly good--- mild memory issues and functional decline  Current Outpatient Prescriptions on File Prior to Visit  Medication Sig Dispense Refill  . acetaminophen (TYLENOL) 325 MG tablet Take 2 tablets (650 mg total) by mouth every 6 (six) hours as needed for mild pain (or Fever >/= 101).    Marland Kitchen albuterol (PROVENTIL) (2.5 MG/3ML) 0.083% nebulizer solution Take 3 mLs (2.5 mg total) by nebulization every 2 (two) hours as needed for wheezing. (Patient not taking: Reported on 06/11/2015) 75 mL 12  . aspirin EC 81 MG tablet Take 81 mg by mouth daily.    Marland Kitchen atorvastatin (LIPITOR) 10 MG tablet Take 10 mg by mouth daily.     . bisacodyl (BISACODYL) 5 MG EC tablet Take 1 tablet (5 mg total) by mouth daily as needed for moderate constipation. 14 tablet 0  . citalopram (CELEXA) 10 MG tablet Take 10 mg by mouth daily.    . clopidogrel (PLAVIX) 75 MG tablet Take 75 mg  by mouth daily.    . feeding supplement, ENSURE ENLIVE, (ENSURE ENLIVE) LIQD Take 237 mLs by mouth 3 (three) times daily with meals. 237 mL 12  . finasteride (PROSCAR) 5 MG tablet Take 5 mg by mouth daily.    . fluticasone (FLONASE) 50 MCG/ACT nasal spray Place 2 sprays into the nose daily. (Patient taking differently: Place 2 sprays into the nose daily as needed for rhinitis. ) 16 g 1  . glipiZIDE (GLUCOTROL XL) 5 MG 24 hr tablet Take 5 mg by mouth daily.    . metoprolol tartrate (LOPRESSOR) 25 MG tablet Take 0.5 tablets (12.5 mg total) by mouth 2 (two) times daily. 60 tablet 0  . Multiple Vitamin (MULTIVITAMIN WITH MINERALS) TABS tablet Take 1 tablet by mouth daily.    . nitroGLYCERIN (NITROSTAT) 0.4 MG SL tablet Place 0.4 mg under the tongue every 5 (five) minutes as needed for chest pain.     Marland Kitchen senna-docusate (SENOKOT-S) 8.6-50 MG per tablet Take 1 tablet by mouth 2 (two) times daily. 60 tablet 0  . triamcinolone cream (KENALOG) 0.1 % Apply 1 application topically 2 (two) times daily as needed. For itchy areas (Patient taking differently: Apply 1 application topically 2 (two) times daily as needed (for dry skin). ) 30 g 2   No current facility-administered medications on file prior to visit.    No Known Allergies  Past  Medical History  Diagnosis Date  . CAD (coronary artery disease)     a. NSTEMI 09/19/2012 s/p PCI ostial LCx,   . Diverticulitis   . GERD (gastroesophageal reflux disease)   . Hyperlipidemia   . Hypertension   . BPH (benign prostatic hypertrophy)   . Diabetes mellitus   . DJD (degenerative joint disease)     lumbar spine  . IBS (irritable bowel syndrome)   . Stroke   . Depression 2012  . Acute MI 09/2012  . Chronic kidney disease     ACUTE RENAL FAILURE  . Foley catheter in place 05/09/15    for 1 year per family  . Complete heart block     a. s/p PPM     Past Surgical History  Procedure Laterality Date  . Angioplasty  1995  . Doppler echocardiography   11/1999    syncope  . Pacemaker insertion      Pacer/cath  (ef 65%)  1/01 -  guidant Discovery II DR pulse generator  . Prostate surgery  07/2003  . Esophagogastroduodenoscopy  07/2005    neg  . Cardiac catheterization  09/2012    NSTEMI/STENT PLACED  . Insert / replace / remove pacemaker      [id number]    Family History  Problem Relation Age of Onset  . Diabetes Sister     Social History   Social History  . Marital Status: Widowed    Spouse Name: N/A  . Number of Children: 5  . Years of Education: N/A   Occupational History  . 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 Smoking status: Former Smoker    Types: Cigarettes  . Smokeless tobacco: Never Used  . Alcohol Use: No     Comment: heavy in the past  . Drug Use: No  . Sexual Activity: No   Other Topics Concern  . Not on file   Social History Narrative   Lives with son and DIL   Son is health care POA   He would accept attempts at resuscitation   Not sure about tube feeds---but probably wouldn't want this   Review of Systems Eating okay--but has lost weight with the hospitalizations Sleep is fairly good--does nap in day Not moving much--has sacral sore area    Objective:   Physical Exam  Constitutional: No distress.  Clearly wasted  Neck: Neck supple. No thyromegaly present.  Cardiovascular: Normal rate, regular rhythm and normal heart sounds.  Exam reveals no gallop.   No murmur heard. Pulmonary/Chest: Effort normal. No respiratory distress. He has no wheezes. He has no rales.  Abdominal: Soft. There is no tenderness.  Musculoskeletal: He exhibits no edema.  Lymphadenopathy:    He has no cervical adenopathy.  Neurological: No cranial nerve deficit.  Speech is garbled Marked weakness in leg extension-- quads clearly wasted No focal weakness though (no hemiparesis)  Psychiatric:  Somewhat passive but not clearly depressed          Assessment & Plan:

## 2015-07-10 NOTE — Assessment & Plan Note (Signed)
Asked son to change to senna s instead of just stool softener

## 2015-07-11 NOTE — Patient Outreach (Signed)
[facility]) Care Management  07/11/2015  Andres [name] Mar 29, 1926 [id number]   Referral from MD via EPIC system, assigned [name], RN to outreach.  Thanks, [name]. [name] [facility] Care Management [facility] CM Assistant Phone: [phone] Fax: [phone]

## 2015-07-12 ENCOUNTER — Other Ambulatory Visit: Payer: Self-pay

## 2015-07-12 ENCOUNTER — Telehealth: Payer: Self-pay | Admitting: *Deleted

## 2015-07-12 DIAGNOSIS — Z9181 History of falling: Secondary | ICD-10-CM | POA: Diagnosis not present

## 2015-07-12 DIAGNOSIS — I5042 Chronic combined systolic (congestive) and diastolic (congestive) heart failure: Secondary | ICD-10-CM | POA: Diagnosis not present

## 2015-07-12 DIAGNOSIS — R338 Other retention of urine: Secondary | ICD-10-CM | POA: Diagnosis not present

## 2015-07-12 DIAGNOSIS — Z7982 Long term (current) use of aspirin: Secondary | ICD-10-CM | POA: Diagnosis not present

## 2015-07-12 DIAGNOSIS — Z7902 Long term (current) use of antithrombotics/antiplatelets: Secondary | ICD-10-CM | POA: Diagnosis not present

## 2015-07-12 DIAGNOSIS — N401 Enlarged prostate with lower urinary tract symptoms: Secondary | ICD-10-CM | POA: Diagnosis not present

## 2015-07-12 DIAGNOSIS — R2681 Unsteadiness on feet: Secondary | ICD-10-CM | POA: Diagnosis not present

## 2015-07-12 DIAGNOSIS — Z599 Problem related to housing and economic circumstances, unspecified: Secondary | ICD-10-CM

## 2015-07-12 DIAGNOSIS — E1122 Type 2 diabetes mellitus with diabetic chronic kidney disease: Secondary | ICD-10-CM | POA: Diagnosis not present

## 2015-07-12 DIAGNOSIS — I129 Hypertensive chronic kidney disease with stage 1 through stage 4 chronic kidney disease, or unspecified chronic kidney disease: Secondary | ICD-10-CM | POA: Diagnosis not present

## 2015-07-12 DIAGNOSIS — F0390 Unspecified dementia without behavioral disturbance: Secondary | ICD-10-CM | POA: Diagnosis not present

## 2015-07-12 DIAGNOSIS — I639 Cerebral infarction, unspecified: Secondary | ICD-10-CM

## 2015-07-12 DIAGNOSIS — N183 Chronic kidney disease, stage 3 (moderate): Secondary | ICD-10-CM | POA: Diagnosis not present

## 2015-07-12 DIAGNOSIS — E43 Unspecified severe protein-calorie malnutrition: Secondary | ICD-10-CM | POA: Diagnosis not present

## 2015-07-12 DIAGNOSIS — Z466 Encounter for fitting and adjustment of urinary device: Secondary | ICD-10-CM | POA: Diagnosis not present

## 2015-07-12 DIAGNOSIS — F329 Major depressive disorder, single episode, unspecified: Secondary | ICD-10-CM | POA: Diagnosis not present

## 2015-07-12 DIAGNOSIS — I2510 Atherosclerotic heart disease of native coronary artery without angina pectoris: Secondary | ICD-10-CM | POA: Diagnosis not present

## 2015-07-12 DIAGNOSIS — L89150 Pressure ulcer of sacral region, unstageable: Secondary | ICD-10-CM | POA: Diagnosis not present

## 2015-07-12 DIAGNOSIS — I252 Old myocardial infarction: Secondary | ICD-10-CM | POA: Diagnosis not present

## 2015-07-12 DIAGNOSIS — M6281 Muscle weakness (generalized): Secondary | ICD-10-CM | POA: Diagnosis not present

## 2015-07-12 NOTE — Patient Outreach (Signed)
[facility]) Care Management  07/12/2015  DASANI CREAR [birth year] [id number]   Referral for MD office: 81 year old with recent admission to hospital. History of UTI,HTN,diabetes, myocardial infarction. Also has a chronic foley catheter that member has had over a year. Home health nurses comes to change once a month. Sacral pressure ulcer.  Member lives with his son and daughter in Social worker. Son, Emoni Yang., is health care power of attorney. Son manages member medications and does not have any questions or issue surrounding medication stating that primary care went over member's medications with him at last office visit on 8/31.   Recently hospitalized 8/17-8/21 with fecal impaction, respiratory failure, with hypoxia, acute heart failure due to volume overload, acute myocardial infarction. Per primary care member recent stoke: ischemic/TIA with speech problems.  Fall risk-History of fall-ambulates with walker. Son reports member is weak from being sick, but states he is "bouncing back a little". Discussed importance of mobility. Currently has home health physical therapy.   Community Resources needs: Currently member has Scientist, research (life sciences) through [employer] with [employer] 2 hours/day Monday through Friday. Son reports member could benefit from more hours of personal care service.  Agreed to [facility] services.   Agreed to [facility] RNCM services and [facility] social work referral.  RN CM encouraged patient to contact RN CM [phone] for any questions or concerns..   RN CM provided contact name and #, 24-hour nurse line # [phone].   RN CM confirmed patient is aware of 911 services for urgent emergency needs. RN CM advised in next follow-call within 10 days from [facility] RN CM.   Plan: Referral to community care coordinator to complete initial evaluation for nursing care coordination needs, also recent discharge from the hospital.   Social work  consult for additional community resources, more personal care service hours. Member is also receiving some Veteran's administration benefits.   [name], RN, MSN, [facility] Community Care Coordinator Cell: [phone]

## 2015-07-12 NOTE — Addendum Note (Signed)
Addended by: Sueanne Margarita on: 07/12/2015 09:08 AM   Modules accepted: Orders

## 2015-07-12 NOTE — Telephone Encounter (Addendum)
Darl Pikes with [facility] called back she just wanted Dr. Alphonsus Sias to know that they took the dressing off of wound and it wasn't bad at all, his skin was only a little pink and that was it. Darl Pikes doesn't think he needs any medications on it they are just going to pt protective barrier cream on it but if it worsens or changes they will let you know she just wanted me to send this to you as an [place]

## 2015-07-15 NOTE — Telephone Encounter (Signed)
Yes--I saw it last week and it was only red with no open areas

## 2015-07-16 NOTE — Telephone Encounter (Signed)
error 

## 2015-07-16 NOTE — Patient Outreach (Signed)
[facility]) Care Management  07/16/2015  LEWIN PELLOW [birth year] [id number]   Request from [name], RN to assigned [employer] and SW, assigned [place] Minor, RN and [employer], [place].  Thanks, [name]. [name] [facility] Care Management [facility] CM Assistant Phone: [phone] Fax: [phone]

## 2015-07-17 DIAGNOSIS — Z9181 History of falling: Secondary | ICD-10-CM | POA: Diagnosis not present

## 2015-07-17 DIAGNOSIS — F329 Major depressive disorder, single episode, unspecified: Secondary | ICD-10-CM | POA: Diagnosis not present

## 2015-07-17 DIAGNOSIS — N183 Chronic kidney disease, stage 3 (moderate): Secondary | ICD-10-CM | POA: Diagnosis not present

## 2015-07-17 DIAGNOSIS — E1122 Type 2 diabetes mellitus with diabetic chronic kidney disease: Secondary | ICD-10-CM | POA: Diagnosis not present

## 2015-07-17 DIAGNOSIS — M6281 Muscle weakness (generalized): Secondary | ICD-10-CM | POA: Diagnosis not present

## 2015-07-17 DIAGNOSIS — Z7982 Long term (current) use of aspirin: Secondary | ICD-10-CM | POA: Diagnosis not present

## 2015-07-17 DIAGNOSIS — I129 Hypertensive chronic kidney disease with stage 1 through stage 4 chronic kidney disease, or unspecified chronic kidney disease: Secondary | ICD-10-CM | POA: Diagnosis not present

## 2015-07-17 DIAGNOSIS — R338 Other retention of urine: Secondary | ICD-10-CM | POA: Diagnosis not present

## 2015-07-17 DIAGNOSIS — I2510 Atherosclerotic heart disease of native coronary artery without angina pectoris: Secondary | ICD-10-CM | POA: Diagnosis not present

## 2015-07-17 DIAGNOSIS — I5042 Chronic combined systolic (congestive) and diastolic (congestive) heart failure: Secondary | ICD-10-CM | POA: Diagnosis not present

## 2015-07-17 DIAGNOSIS — E43 Unspecified severe protein-calorie malnutrition: Secondary | ICD-10-CM | POA: Diagnosis not present

## 2015-07-17 DIAGNOSIS — Z466 Encounter for fitting and adjustment of urinary device: Secondary | ICD-10-CM | POA: Diagnosis not present

## 2015-07-17 DIAGNOSIS — F0390 Unspecified dementia without behavioral disturbance: Secondary | ICD-10-CM | POA: Diagnosis not present

## 2015-07-17 DIAGNOSIS — N401 Enlarged prostate with lower urinary tract symptoms: Secondary | ICD-10-CM | POA: Diagnosis not present

## 2015-07-17 DIAGNOSIS — Z7902 Long term (current) use of antithrombotics/antiplatelets: Secondary | ICD-10-CM | POA: Diagnosis not present

## 2015-07-17 DIAGNOSIS — L89150 Pressure ulcer of sacral region, unstageable: Secondary | ICD-10-CM | POA: Diagnosis not present

## 2015-07-17 DIAGNOSIS — I252 Old myocardial infarction: Secondary | ICD-10-CM | POA: Diagnosis not present

## 2015-07-17 DIAGNOSIS — R2681 Unsteadiness on feet: Secondary | ICD-10-CM | POA: Diagnosis not present

## 2015-07-18 ENCOUNTER — Other Ambulatory Visit: Payer: Self-pay | Admitting: *Deleted

## 2015-07-18 NOTE — Patient Outreach (Signed)
[facility] Care Management  07/18/2015  Andres [name] 1926/04/10 [id number]  Phone call, along with RNCM [name] to patient's son to schedule initial home visit.  Voicemail message left for a return call.    Adriana Reams [facility] Care Management [phone]

## 2015-07-19 ENCOUNTER — Other Ambulatory Visit: Payer: Self-pay | Admitting: *Deleted

## 2015-07-19 DIAGNOSIS — N183 Chronic kidney disease, stage 3 (moderate): Secondary | ICD-10-CM | POA: Diagnosis not present

## 2015-07-19 DIAGNOSIS — Z466 Encounter for fitting and adjustment of urinary device: Secondary | ICD-10-CM | POA: Diagnosis not present

## 2015-07-19 DIAGNOSIS — R338 Other retention of urine: Secondary | ICD-10-CM | POA: Diagnosis not present

## 2015-07-19 DIAGNOSIS — I2510 Atherosclerotic heart disease of native coronary artery without angina pectoris: Secondary | ICD-10-CM | POA: Diagnosis not present

## 2015-07-19 DIAGNOSIS — N401 Enlarged prostate with lower urinary tract symptoms: Secondary | ICD-10-CM | POA: Diagnosis not present

## 2015-07-19 DIAGNOSIS — Z9181 History of falling: Secondary | ICD-10-CM | POA: Diagnosis not present

## 2015-07-19 DIAGNOSIS — Z7902 Long term (current) use of antithrombotics/antiplatelets: Secondary | ICD-10-CM | POA: Diagnosis not present

## 2015-07-19 DIAGNOSIS — I5042 Chronic combined systolic (congestive) and diastolic (congestive) heart failure: Secondary | ICD-10-CM | POA: Diagnosis not present

## 2015-07-19 DIAGNOSIS — F0390 Unspecified dementia without behavioral disturbance: Secondary | ICD-10-CM | POA: Diagnosis not present

## 2015-07-19 DIAGNOSIS — E43 Unspecified severe protein-calorie malnutrition: Secondary | ICD-10-CM | POA: Diagnosis not present

## 2015-07-19 DIAGNOSIS — F329 Major depressive disorder, single episode, unspecified: Secondary | ICD-10-CM | POA: Diagnosis not present

## 2015-07-19 DIAGNOSIS — R2681 Unsteadiness on feet: Secondary | ICD-10-CM | POA: Diagnosis not present

## 2015-07-19 DIAGNOSIS — I252 Old myocardial infarction: Secondary | ICD-10-CM | POA: Diagnosis not present

## 2015-07-19 DIAGNOSIS — E1122 Type 2 diabetes mellitus with diabetic chronic kidney disease: Secondary | ICD-10-CM | POA: Diagnosis not present

## 2015-07-19 DIAGNOSIS — L89150 Pressure ulcer of sacral region, unstageable: Secondary | ICD-10-CM | POA: Diagnosis not present

## 2015-07-19 DIAGNOSIS — I129 Hypertensive chronic kidney disease with stage 1 through stage 4 chronic kidney disease, or unspecified chronic kidney disease: Secondary | ICD-10-CM | POA: Diagnosis not present

## 2015-07-19 DIAGNOSIS — M6281 Muscle weakness (generalized): Secondary | ICD-10-CM | POA: Diagnosis not present

## 2015-07-19 DIAGNOSIS — Z7982 Long term (current) use of aspirin: Secondary | ICD-10-CM | POA: Diagnosis not present

## 2015-07-19 NOTE — Patient Outreach (Signed)
[facility]) Care Management  07/19/2015  DEONTA BOMBERGER 06/30/1926 [id number]   Return call from patient's son stating that he would like to look into obtaining more personal care hours through veteran affairs for patient as well as continued physical therapy.  Home visit scheduled for 08/01/15 at 3:00pm tentatively.  This Child psychotherapist will confirm date and time with RNCM.    Adriana Reams [facility] Care Management [phone]

## 2015-07-22 DIAGNOSIS — Z7982 Long term (current) use of aspirin: Secondary | ICD-10-CM | POA: Diagnosis not present

## 2015-07-22 DIAGNOSIS — I5042 Chronic combined systolic (congestive) and diastolic (congestive) heart failure: Secondary | ICD-10-CM | POA: Diagnosis not present

## 2015-07-22 DIAGNOSIS — N183 Chronic kidney disease, stage 3 (moderate): Secondary | ICD-10-CM | POA: Diagnosis not present

## 2015-07-22 DIAGNOSIS — F329 Major depressive disorder, single episode, unspecified: Secondary | ICD-10-CM | POA: Diagnosis not present

## 2015-07-22 DIAGNOSIS — F0390 Unspecified dementia without behavioral disturbance: Secondary | ICD-10-CM | POA: Diagnosis not present

## 2015-07-22 DIAGNOSIS — M6281 Muscle weakness (generalized): Secondary | ICD-10-CM | POA: Diagnosis not present

## 2015-07-22 DIAGNOSIS — E43 Unspecified severe protein-calorie malnutrition: Secondary | ICD-10-CM | POA: Diagnosis not present

## 2015-07-22 DIAGNOSIS — Z7902 Long term (current) use of antithrombotics/antiplatelets: Secondary | ICD-10-CM | POA: Diagnosis not present

## 2015-07-22 DIAGNOSIS — I2510 Atherosclerotic heart disease of native coronary artery without angina pectoris: Secondary | ICD-10-CM | POA: Diagnosis not present

## 2015-07-22 DIAGNOSIS — E1122 Type 2 diabetes mellitus with diabetic chronic kidney disease: Secondary | ICD-10-CM | POA: Diagnosis not present

## 2015-07-22 DIAGNOSIS — N401 Enlarged prostate with lower urinary tract symptoms: Secondary | ICD-10-CM | POA: Diagnosis not present

## 2015-07-22 DIAGNOSIS — R2681 Unsteadiness on feet: Secondary | ICD-10-CM | POA: Diagnosis not present

## 2015-07-22 DIAGNOSIS — I129 Hypertensive chronic kidney disease with stage 1 through stage 4 chronic kidney disease, or unspecified chronic kidney disease: Secondary | ICD-10-CM | POA: Diagnosis not present

## 2015-07-22 DIAGNOSIS — Z9181 History of falling: Secondary | ICD-10-CM | POA: Diagnosis not present

## 2015-07-22 DIAGNOSIS — L89150 Pressure ulcer of sacral region, unstageable: Secondary | ICD-10-CM | POA: Diagnosis not present

## 2015-07-22 DIAGNOSIS — R338 Other retention of urine: Secondary | ICD-10-CM | POA: Diagnosis not present

## 2015-07-22 DIAGNOSIS — Z466 Encounter for fitting and adjustment of urinary device: Secondary | ICD-10-CM | POA: Diagnosis not present

## 2015-07-22 DIAGNOSIS — I252 Old myocardial infarction: Secondary | ICD-10-CM | POA: Diagnosis not present

## 2015-07-24 DIAGNOSIS — I129 Hypertensive chronic kidney disease with stage 1 through stage 4 chronic kidney disease, or unspecified chronic kidney disease: Secondary | ICD-10-CM | POA: Diagnosis not present

## 2015-07-24 DIAGNOSIS — Z7982 Long term (current) use of aspirin: Secondary | ICD-10-CM | POA: Diagnosis not present

## 2015-07-24 DIAGNOSIS — F0390 Unspecified dementia without behavioral disturbance: Secondary | ICD-10-CM | POA: Diagnosis not present

## 2015-07-24 DIAGNOSIS — N401 Enlarged prostate with lower urinary tract symptoms: Secondary | ICD-10-CM | POA: Diagnosis not present

## 2015-07-24 DIAGNOSIS — N183 Chronic kidney disease, stage 3 (moderate): Secondary | ICD-10-CM | POA: Diagnosis not present

## 2015-07-24 DIAGNOSIS — I2510 Atherosclerotic heart disease of native coronary artery without angina pectoris: Secondary | ICD-10-CM | POA: Diagnosis not present

## 2015-07-24 DIAGNOSIS — R338 Other retention of urine: Secondary | ICD-10-CM | POA: Diagnosis not present

## 2015-07-24 DIAGNOSIS — F329 Major depressive disorder, single episode, unspecified: Secondary | ICD-10-CM | POA: Diagnosis not present

## 2015-07-24 DIAGNOSIS — E43 Unspecified severe protein-calorie malnutrition: Secondary | ICD-10-CM | POA: Diagnosis not present

## 2015-07-24 DIAGNOSIS — I5042 Chronic combined systolic (congestive) and diastolic (congestive) heart failure: Secondary | ICD-10-CM | POA: Diagnosis not present

## 2015-07-24 DIAGNOSIS — R2681 Unsteadiness on feet: Secondary | ICD-10-CM | POA: Diagnosis not present

## 2015-07-24 DIAGNOSIS — I252 Old myocardial infarction: Secondary | ICD-10-CM | POA: Diagnosis not present

## 2015-07-24 DIAGNOSIS — L89150 Pressure ulcer of sacral region, unstageable: Secondary | ICD-10-CM | POA: Diagnosis not present

## 2015-07-24 DIAGNOSIS — E1122 Type 2 diabetes mellitus with diabetic chronic kidney disease: Secondary | ICD-10-CM | POA: Diagnosis not present

## 2015-07-24 DIAGNOSIS — Z9181 History of falling: Secondary | ICD-10-CM | POA: Diagnosis not present

## 2015-07-24 DIAGNOSIS — Z466 Encounter for fitting and adjustment of urinary device: Secondary | ICD-10-CM | POA: Diagnosis not present

## 2015-07-24 DIAGNOSIS — Z7902 Long term (current) use of antithrombotics/antiplatelets: Secondary | ICD-10-CM | POA: Diagnosis not present

## 2015-07-24 DIAGNOSIS — M6281 Muscle weakness (generalized): Secondary | ICD-10-CM | POA: Diagnosis not present

## 2015-07-24 NOTE — Progress Notes (Signed)
This encounter was created in error - please disregard.

## 2015-07-25 ENCOUNTER — Other Ambulatory Visit: Payer: Self-pay | Admitting: *Deleted

## 2015-07-25 NOTE — Patient Outreach (Signed)
[facility]) Care Management  07/25/2015  Andres [name] 1926/09/29 [id number]   Phone call to [facility] to found out what would be needed to increase patient's Aid and Attendance.  Per Carollee Herter, patient's caregiver would have to submit a affidavit showing that patient's expenses had increased.  The VA would then make  the determination to increase his benefit.    The affidavit will be emailed to this social worker to be given to patient's son to complete during initial home visit on 08/01/15.   Adriana Reams [facility] Care Management [phone]

## 2015-07-29 ENCOUNTER — Other Ambulatory Visit: Payer: Self-pay | Admitting: Internal Medicine

## 2015-07-30 ENCOUNTER — Encounter: Payer: Medicare Other | Admitting: Internal Medicine

## 2015-07-30 DIAGNOSIS — Z466 Encounter for fitting and adjustment of urinary device: Secondary | ICD-10-CM | POA: Diagnosis not present

## 2015-07-30 DIAGNOSIS — I129 Hypertensive chronic kidney disease with stage 1 through stage 4 chronic kidney disease, or unspecified chronic kidney disease: Secondary | ICD-10-CM | POA: Diagnosis not present

## 2015-07-30 DIAGNOSIS — F0390 Unspecified dementia without behavioral disturbance: Secondary | ICD-10-CM | POA: Diagnosis not present

## 2015-07-30 DIAGNOSIS — F329 Major depressive disorder, single episode, unspecified: Secondary | ICD-10-CM | POA: Diagnosis not present

## 2015-07-30 DIAGNOSIS — I2510 Atherosclerotic heart disease of native coronary artery without angina pectoris: Secondary | ICD-10-CM | POA: Diagnosis not present

## 2015-07-30 DIAGNOSIS — N401 Enlarged prostate with lower urinary tract symptoms: Secondary | ICD-10-CM | POA: Diagnosis not present

## 2015-07-30 DIAGNOSIS — I5042 Chronic combined systolic (congestive) and diastolic (congestive) heart failure: Secondary | ICD-10-CM | POA: Diagnosis not present

## 2015-07-30 DIAGNOSIS — L89150 Pressure ulcer of sacral region, unstageable: Secondary | ICD-10-CM | POA: Diagnosis not present

## 2015-07-30 DIAGNOSIS — N183 Chronic kidney disease, stage 3 (moderate): Secondary | ICD-10-CM | POA: Diagnosis not present

## 2015-07-30 DIAGNOSIS — I252 Old myocardial infarction: Secondary | ICD-10-CM | POA: Diagnosis not present

## 2015-07-30 DIAGNOSIS — Z9181 History of falling: Secondary | ICD-10-CM | POA: Diagnosis not present

## 2015-07-30 DIAGNOSIS — R338 Other retention of urine: Secondary | ICD-10-CM | POA: Diagnosis not present

## 2015-07-30 DIAGNOSIS — Z7982 Long term (current) use of aspirin: Secondary | ICD-10-CM | POA: Diagnosis not present

## 2015-07-30 DIAGNOSIS — M6281 Muscle weakness (generalized): Secondary | ICD-10-CM | POA: Diagnosis not present

## 2015-07-30 DIAGNOSIS — E43 Unspecified severe protein-calorie malnutrition: Secondary | ICD-10-CM | POA: Diagnosis not present

## 2015-07-30 DIAGNOSIS — R2681 Unsteadiness on feet: Secondary | ICD-10-CM | POA: Diagnosis not present

## 2015-07-30 DIAGNOSIS — E1122 Type 2 diabetes mellitus with diabetic chronic kidney disease: Secondary | ICD-10-CM | POA: Diagnosis not present

## 2015-07-30 DIAGNOSIS — Z7902 Long term (current) use of antithrombotics/antiplatelets: Secondary | ICD-10-CM | POA: Diagnosis not present

## 2015-08-01 ENCOUNTER — Other Ambulatory Visit: Payer: Self-pay | Admitting: *Deleted

## 2015-08-01 VITALS — BP 100/50 | HR 60 | Resp 16 | Ht 68.0 in | Wt 119.0 lb

## 2015-08-01 DIAGNOSIS — Z7982 Long term (current) use of aspirin: Secondary | ICD-10-CM | POA: Diagnosis not present

## 2015-08-01 DIAGNOSIS — F0390 Unspecified dementia without behavioral disturbance: Secondary | ICD-10-CM | POA: Diagnosis not present

## 2015-08-01 DIAGNOSIS — I252 Old myocardial infarction: Secondary | ICD-10-CM | POA: Diagnosis not present

## 2015-08-01 DIAGNOSIS — I5042 Chronic combined systolic (congestive) and diastolic (congestive) heart failure: Secondary | ICD-10-CM | POA: Diagnosis not present

## 2015-08-01 DIAGNOSIS — N183 Chronic kidney disease, stage 3 (moderate): Secondary | ICD-10-CM | POA: Diagnosis not present

## 2015-08-01 DIAGNOSIS — M6281 Muscle weakness (generalized): Secondary | ICD-10-CM | POA: Diagnosis not present

## 2015-08-01 DIAGNOSIS — I129 Hypertensive chronic kidney disease with stage 1 through stage 4 chronic kidney disease, or unspecified chronic kidney disease: Secondary | ICD-10-CM | POA: Diagnosis not present

## 2015-08-01 DIAGNOSIS — F329 Major depressive disorder, single episode, unspecified: Secondary | ICD-10-CM | POA: Diagnosis not present

## 2015-08-01 DIAGNOSIS — R338 Other retention of urine: Secondary | ICD-10-CM | POA: Diagnosis not present

## 2015-08-01 DIAGNOSIS — N401 Enlarged prostate with lower urinary tract symptoms: Secondary | ICD-10-CM | POA: Diagnosis not present

## 2015-08-01 DIAGNOSIS — Z7902 Long term (current) use of antithrombotics/antiplatelets: Secondary | ICD-10-CM | POA: Diagnosis not present

## 2015-08-01 DIAGNOSIS — Z79899 Other long term (current) drug therapy: Secondary | ICD-10-CM

## 2015-08-01 DIAGNOSIS — E43 Unspecified severe protein-calorie malnutrition: Secondary | ICD-10-CM | POA: Diagnosis not present

## 2015-08-01 DIAGNOSIS — L89150 Pressure ulcer of sacral region, unstageable: Secondary | ICD-10-CM | POA: Diagnosis not present

## 2015-08-01 DIAGNOSIS — Z466 Encounter for fitting and adjustment of urinary device: Secondary | ICD-10-CM | POA: Diagnosis not present

## 2015-08-01 DIAGNOSIS — R2681 Unsteadiness on feet: Secondary | ICD-10-CM | POA: Diagnosis not present

## 2015-08-01 DIAGNOSIS — Z9181 History of falling: Secondary | ICD-10-CM | POA: Diagnosis not present

## 2015-08-01 DIAGNOSIS — I2510 Atherosclerotic heart disease of native coronary artery without angina pectoris: Secondary | ICD-10-CM | POA: Diagnosis not present

## 2015-08-01 DIAGNOSIS — E1122 Type 2 diabetes mellitus with diabetic chronic kidney disease: Secondary | ICD-10-CM | POA: Diagnosis not present

## 2015-08-01 NOTE — Patient Outreach (Signed)
[facility]) Care Management  08/01/2015  TEMESGEN WEIGHTMAN 1926-04-22 [id number]   Request from [name], RN to assign Pharmacy, assigned Moises Blood, PharmD.  Thanks, [name]. [name] [facility] Care Management [facility] CM Assistant Phone: [phone] Fax: [phone]

## 2015-08-02 NOTE — Patient Outreach (Signed)
[facility]) Care Management   08/02/2015  Andres [name] September 25, 1926 [id number]  Andres [name] is an 81 y.o. male  Subjective: "I am afraid to get up because I am afraid I will fall." Son states, "I need more help, there are times I have to leave Dad alone and this makes me nervous."    Objective: Blood pressure 100/50, pulse 60, resp. rate 16, weight 119 lb (53.978 kg), SpO2 92 %. BMI: 18  Review of Systems  Constitutional: Positive for weight loss and malaise/fatigue.  HENT: Positive for hearing loss.   Gastrointestinal: Positive for constipation.  Genitourinary:       Indwelling foley in place    Physical Exam  Constitutional: He is oriented to person, place, and time. He appears lethargic. He appears cachectic. He is cooperative. He is easily aroused. He has a sickly appearance.  Cardiovascular: Normal rate and regular rhythm.   Pace maker noted   Respiratory: Effort normal and breath sounds normal.  Neurological: He is oriented to person, place, and time and easily aroused. He appears lethargic. He displays atrophy.  Skin: There is pallor.  Psychiatric: His speech is delayed and slurred.    Current Medications:   Current Outpatient Prescriptions  Medication Sig Dispense Refill  . aspirin EC 81 MG tablet Take 81 mg by mouth daily.    Marland Kitchen atorvastatin (LIPITOR) 10 MG tablet Take 10 mg by mouth daily.     . bisacodyl (BISACODYL) 5 MG EC tablet Take 1 tablet (5 mg total) by mouth daily as needed for moderate constipation. 14 tablet 0  . citalopram (CELEXA) 10 MG tablet Take 10 mg by mouth daily.    . clopidogrel (PLAVIX) 75 MG tablet Take 75 mg by mouth daily.    . feeding supplement, ENSURE ENLIVE, (ENSURE ENLIVE) LIQD Take 237 mLs by mouth 3 (three) times daily with meals. 237 mL 12  . finasteride (PROSCAR) 5 MG tablet Take 5 mg by mouth daily.    Marland Kitchen glipiZIDE (GLUCOTROL XL) 5 MG 24 hr tablet Take 5 mg by mouth daily.    Marland Kitchen lisinopril (PRINIVIL,ZESTRIL) 5 MG  tablet Take 5 mg by mouth daily.    . metFORMIN (GLUCOPHAGE) 1000 MG tablet Take one-half tablet by  mouth two times daily with  meals 90 tablet 3  . metoprolol succinate (TOPROL-XL) 25 MG 24 hr tablet Take 1 tablet (25 mg total) by mouth daily. 90 tablet 3  . Multiple Vitamin (MULTIVITAMIN WITH MINERALS) TABS tablet Take 1 tablet by mouth daily.    Marland Kitchen senna-docusate (SENOKOT-S) 8.6-50 MG per tablet Take 1 tablet by mouth 2 (two) times daily. 60 tablet 0  . triamcinolone cream (KENALOG) 0.1 % Apply 1 application topically 2 (two) times daily as needed. For itchy areas (Patient taking differently: Apply 1 application topically 2 (two) times daily as needed (for dry skin). ) 30 g 2  . acetaminophen (TYLENOL) 325 MG tablet Take 2 tablets (650 mg total) by mouth every 6 (six) hours as needed for mild pain (or Fever >/= 101).    Marland Kitchen albuterol (PROVENTIL) (2.5 MG/3ML) 0.083% nebulizer solution Take 3 mLs (2.5 mg total) by nebulization every 2 (two) hours as needed for wheezing. (Patient not taking: Reported on 06/11/2015) 75 mL 12  . fluticasone (FLONASE) 50 MCG/ACT nasal spray Place 2 sprays into the nose daily. (Patient not taking: Reported on 08/01/2015) 16 g 1  . nitroGLYCERIN (NITROSTAT) 0.4 MG SL tablet Place 0.4 mg under the tongue every  5 (five) minutes as needed for chest pain.      No current facility-administered medications for this visit.    Functional Status:   In your present state of health, do you have any difficulty performing the following activities: 07/12/2015 06/28/2015  Hearing? Malvin Johns  Vision? N N  Difficulty concentrating or making decisions? Y N  Walking or climbing stairs? - Y  Dressing or bathing? Y Y  Doing errands, shopping? Malvin Johns  Preparing Food and eating ? N -  Using the Toilet? Y -  In the past six months, have you accidently leaked urine? N -  Do you have problems with loss of bowel control? Y -  Managing your Medications? Y -  Managing your Finances? Y -  Housekeeping or  managing your Housekeeping? Y -    Fall/Depression Screening:    Fall Risk  07/12/2015 12/17/2014 12/17/2014 07/14/2013  Falls in the past year? Yes No No No  Number falls in past yr: 2 or more - - -  Injury with Fall? No - - -  Risk Factor Category  High Fall Risk - - -  Risk for fall due to : History of fall(s);Impaired mobility - - -  Follow up Education provided - - -    Assessment: See Physical assessment as above. Co-visit with Chrystal Land [facility]-SW  General: Cachectic elderly gentleman, hard to understand his speech related to prior CVA. Pt arouses to loud noises related to being hard of hearing, answers questions appropriately. Incontinent of stool and uses an indwelling foley for hx of BPH. Foley bag noted to be full and cloudy hanging on pt walker. Home atmosphere clean and well equipped for pt's care. Pt denies pain. Pt noted to be pale, with poor skin turgor, and bilat feet cool to touch. Pt clean, with clean clothes and bed linens. Wearing diaper for stool incontinence. Pt able to get up with walker and stand by assist, and walk to living room(15 ft).   Medications: Managed by son. Son unsure why he is unable to obtain medications through the [place] and would like assistance in finding out. Son reports pt takes medications without difficulty.  Diabetes: Pt nor caregivers monitor pt blood sugar at this time. Pt has a meter but son was unable to find it during pt visit. Pt's bilateral feet with skin intact.   CAD/Hypertension: Pt's caregiver had written down pt b/p's which noted on average low. MD has recently decreased pts b/p medication. RNCM encouraged son to remind in home aides to monitor b/p daily.   RNCM and SW discussed with son his concerns about not having enough help during the day while he works for his father's care. SW made phone calls to the [place]. RNCM discussed hospice as an option for getting more assistance in. Son acceptable to this and stated he just needed help.   Plan: RNCM  will request MD consider a hospice consult.  RNCM will collaborate with SW to obtain additional in home care. RNCM will place a pharmacy consult related to possible pharmacy benefits through the [place]. RNCM will visit pt in the next 2 weeks while caregivers are there to observe pt in his daily activities.  [name] RN, BSN  [facility] Care Management [phone])  [facility] CM Care Plan Problem One        Most Recent Value   Care Plan Problem One  Adult failure to thrive as evidenced by weight loss, BMI=18 and multiple hospital admits   Care  Plan for Problem One  Active   [facility]N Long Term Goal (31-90 days)  Pt will not be readmitted to the hospital in the next 90 days.   [facility] Long Term Goal Start Date  08/01/15   Interventions for Problem One Long Term Goal  Initial home visit by [facility] to assess pt and caregiver situation. RNCM discussed with son ideas to increase pt strength.     [facility] CM Short Term Goal #1 (0-30 days)  Pt will spend more time out of bed than in bed during the day in the next 30 days.   [facility] CM Short Term Goal #1 Start Date  08/01/15   Interventions for Short Term Goal #1  RNCM talked with Son about the importance of pt staying out of bed related to strength and skin integrity. RNCM discused with pt the reason he does not want to get up.  RNCM plans to  make a home visit with daily care givers present.   [facility] CM Short Term Goal #2 (0-30 days)  Pt will gain 5 pounds in the next 30 days.   [facility] CM Short Term Goal #2 Start Date  08/01/15   Interventions for Short Term Goal #2  Nutrition discussed with son, cupons given for ensure, reinforced MD plan to add icecream to increase calories.

## 2015-08-02 NOTE — Patient Outreach (Addendum)
[name]) Care Management  [name] Social Work  08/02/2015  Andres [name] 09/03/1926 [id number]  Subjective:    Objective:   Current Medications:  Current Outpatient Prescriptions  Medication Sig Dispense Refill  . acetaminophen (TYLENOL) 325 MG tablet Take 2 tablets (650 mg total) by mouth every 6 (six) hours as needed for mild pain (or Fever >/= 101).    Marland Kitchen albuterol (PROVENTIL) (2.5 MG/3ML) 0.083% nebulizer solution Take 3 mLs (2.5 mg total) by nebulization every 2 (two) hours as needed for wheezing. (Patient not taking: Reported on 06/11/2015) 75 mL 12  . aspirin EC 81 MG tablet Take 81 mg by mouth daily.    Marland Kitchen atorvastatin (LIPITOR) 10 MG tablet Take 10 mg by mouth daily.     . bisacodyl (BISACODYL) 5 MG EC tablet Take 1 tablet (5 mg total) by mouth daily as needed for moderate constipation. 14 tablet 0  . citalopram (CELEXA) 10 MG tablet Take 10 mg by mouth daily.    . clopidogrel (PLAVIX) 75 MG tablet Take 75 mg by mouth daily.    . feeding supplement, ENSURE ENLIVE, (ENSURE ENLIVE) LIQD Take 237 mLs by mouth 3 (three) times daily with meals. 237 mL 12  . finasteride (PROSCAR) 5 MG tablet Take 5 mg by mouth daily.    . fluticasone (FLONASE) 50 MCG/ACT nasal spray Place 2 sprays into the nose daily. (Patient not taking: Reported on 08/01/2015) 16 g 1  . glipiZIDE (GLUCOTROL XL) 5 MG 24 hr tablet Take 5 mg by mouth daily.    Marland Kitchen lisinopril (PRINIVIL,ZESTRIL) 5 MG tablet Take 5 mg by mouth daily.    . metFORMIN (GLUCOPHAGE) 1000 MG tablet Take one-half tablet by  mouth two times daily with  meals 90 tablet 3  . metoprolol succinate (TOPROL-XL) 25 MG 24 hr tablet Take 1 tablet (25 mg total) by mouth daily. 90 tablet 3  . Multiple Vitamin (MULTIVITAMIN WITH MINERALS) TABS tablet Take 1 tablet by mouth daily.    . nitroGLYCERIN (NITROSTAT) 0.4 MG SL tablet Place 0.4 mg under the tongue every 5 (five) minutes as needed for chest pain.     Marland Kitchen senna-docusate (SENOKOT-S)  8.6-50 MG per tablet Take 1 tablet by mouth 2 (two) times daily. 60 tablet 0  . triamcinolone cream (KENALOG) 0.1 % Apply 1 application topically 2 (two) times daily as needed. For itchy areas (Patient taking differently: Apply 1 application topically 2 (two) times daily as needed (for dry skin). ) 30 g 2   No current facility-administered medications for this visit.    Functional Status:  In your present state of health, do you have any difficulty performing the following activities: 07/12/2015 06/28/2015  Hearing? Andres [name]  Vision? N N  Difficulty concentrating or making decisions? Y N  Walking or climbing stairs? - Y  Dressing or bathing? Y Y  Doing errands, shopping? [name]  Preparing Food and eating ? N -  Using the Toilet? Y -  In the past six months, have you accidently leaked urine? N -  Do you have problems with loss of bowel control? Y -  Managing your Medications? Y -  Managing your Finances? Y -  Housekeeping or managing your Housekeeping? Y -    Fall/Depression Screening:  PHQ 2/9 Scores 12/17/2014  PHQ - 2 Score 0    Assessment: Co-visit with RNCM Andres [name].  Met with patient's son initially to obtain general information regarding patient's needs.  Per patient's son, patient receives  both VA pension and social security benefits.  Patient receives 2 hours of Patient aligned Care team( PACT) hours (personal care services) through the [place] and patient's son pays privately for an  aid for patient for 3 hours per day.  Per patient's son, he feels that patient needs these hours extended.  Phone call to Andres [name],  Andres [name], who confirmed that patient receives the maximum pension benefit.  Per Andres [name], if patient needs more PACT hours, he would have to go through his primary care doctor, Andres Dubin, MD at the the [name].   Attempt made to call Andres [name] clinic, however they were closed.  This Education officer, museum did speak to [place] worker at  [facility]res [name] clinic [phone] ext [phone] who referred [place] to Eligibility and Registration for in home services to  discuss request and eligibility to extend patient's hours.  This Education officer, museum and RNCM met with patient.  Patient was in bed, appeared extremely frail, speech slowed and difficult to understand.  Patient was able to get out of bed and walk with his walker and RNCM assistance from his bedroom to the living room and back.  Patient allowed his vitals to be taken by Andres [name].  Patient states that he does drink nutritional supplements(Boost). Patient was clean, surroundings clean and orderly as well.  Patient's bathroom equipped with shower chair and raised toilet seat.  Plan: RNCM and social worker discussed with patient's son possibility of patient receiving Hospice services.  Patient's son in agreement, stating that he just wants more help for patient.            RNCM states that she will request a order for a hospice evaluation from patient's primary care doctor            This social worker will coordinate care with patient's son and RNCM.    Andres [name] Care Management [phone]

## 2015-08-05 DIAGNOSIS — L89150 Pressure ulcer of sacral region, unstageable: Secondary | ICD-10-CM | POA: Diagnosis not present

## 2015-08-05 DIAGNOSIS — I129 Hypertensive chronic kidney disease with stage 1 through stage 4 chronic kidney disease, or unspecified chronic kidney disease: Secondary | ICD-10-CM | POA: Diagnosis not present

## 2015-08-05 DIAGNOSIS — Z7982 Long term (current) use of aspirin: Secondary | ICD-10-CM | POA: Diagnosis not present

## 2015-08-05 DIAGNOSIS — I252 Old myocardial infarction: Secondary | ICD-10-CM | POA: Diagnosis not present

## 2015-08-05 DIAGNOSIS — M6281 Muscle weakness (generalized): Secondary | ICD-10-CM | POA: Diagnosis not present

## 2015-08-05 DIAGNOSIS — Z7902 Long term (current) use of antithrombotics/antiplatelets: Secondary | ICD-10-CM | POA: Diagnosis not present

## 2015-08-05 DIAGNOSIS — N401 Enlarged prostate with lower urinary tract symptoms: Secondary | ICD-10-CM | POA: Diagnosis not present

## 2015-08-05 DIAGNOSIS — Z9181 History of falling: Secondary | ICD-10-CM | POA: Diagnosis not present

## 2015-08-05 DIAGNOSIS — E1122 Type 2 diabetes mellitus with diabetic chronic kidney disease: Secondary | ICD-10-CM | POA: Diagnosis not present

## 2015-08-05 DIAGNOSIS — N183 Chronic kidney disease, stage 3 (moderate): Secondary | ICD-10-CM | POA: Diagnosis not present

## 2015-08-05 DIAGNOSIS — I2510 Atherosclerotic heart disease of native coronary artery without angina pectoris: Secondary | ICD-10-CM | POA: Diagnosis not present

## 2015-08-05 DIAGNOSIS — E43 Unspecified severe protein-calorie malnutrition: Secondary | ICD-10-CM | POA: Diagnosis not present

## 2015-08-05 DIAGNOSIS — Z466 Encounter for fitting and adjustment of urinary device: Secondary | ICD-10-CM | POA: Diagnosis not present

## 2015-08-05 DIAGNOSIS — R338 Other retention of urine: Secondary | ICD-10-CM | POA: Diagnosis not present

## 2015-08-05 DIAGNOSIS — F0390 Unspecified dementia without behavioral disturbance: Secondary | ICD-10-CM | POA: Diagnosis not present

## 2015-08-05 DIAGNOSIS — R2681 Unsteadiness on feet: Secondary | ICD-10-CM | POA: Diagnosis not present

## 2015-08-05 DIAGNOSIS — I5042 Chronic combined systolic (congestive) and diastolic (congestive) heart failure: Secondary | ICD-10-CM | POA: Diagnosis not present

## 2015-08-05 DIAGNOSIS — F329 Major depressive disorder, single episode, unspecified: Secondary | ICD-10-CM | POA: Diagnosis not present

## 2015-08-09 ENCOUNTER — Other Ambulatory Visit: Payer: Self-pay | Admitting: *Deleted

## 2015-08-09 ENCOUNTER — Other Ambulatory Visit: Payer: Self-pay | Admitting: Pharmacist

## 2015-08-09 NOTE — Patient Outreach (Signed)
RNCM called pt's son and HPOA, to set up home visit next week to talk with caregivers in the home and complete home assessment with pt. Pt's son agreeable to this. Also let son know [place] MD did not feel pt was appropriate for Hospice at this time. Son verbalized agreement. RNCM also let son know that SW was continuing to work on obtaining more caregiver hours through the [place]. Son verbalized appreciaton for the help.   Plan: RNCM will see pt on 10/4 @ 1:15- 2:15 to be able to meet with in home caregivers who change shifts at 2pm.   Ma Rings Minor RN, BSN  [facility] Care Management [phone])

## 2015-08-09 NOTE — Patient Outreach (Signed)
ZANE PELLECCHIA is a 81 y.o. male referred to pharmacy for medication assistance and help with getting the patient set up with receiving his medications from [facility]. Called to reach out to the patient and reached his son, Megan Hayduk.   Mr. Dault reports that his father sees a primary care doctor at the [facility] clinic on [address] annually. Reports that he has not received any medications from this provider, all medication prescribed by Dr. Alphonsus Sias. Advise Mr. Naval that in order for [facility] to [facility] his medications, they will need to be prescribed by his VA physician. Advise Mr. Zou to schedule an appointment for his father with his VA primary care provider in order to discuss her doing this for the patient in order for him to be able to receive this benefit. Briefly discussed with Mr. Payden Docter formulary. Mr. Eguia states that he will call and follow up with me once this visit has been scheduled.  Also discussed Extra Help application with Mr. Patchin as an additional option.  Will wait for Mr. Eddleman to call me back. If have not heard back in two weeks, will follow up again at that time.  Duanne Moron, PharmD Clinical Pharmacist [facility] Care Management [phone]

## 2015-08-12 DIAGNOSIS — Z23 Encounter for immunization: Secondary | ICD-10-CM | POA: Diagnosis not present

## 2015-08-13 ENCOUNTER — Ambulatory Visit: Payer: Medicare Other | Admitting: *Deleted

## 2015-08-16 ENCOUNTER — Other Ambulatory Visit: Payer: Self-pay | Admitting: Internal Medicine

## 2015-08-22 ENCOUNTER — Other Ambulatory Visit: Payer: Self-pay | Admitting: *Deleted

## 2015-08-22 NOTE — Patient Outreach (Signed)
[facility] Care Management  08/22/2015  Randolm IdolJoseph J Mitter 1926/02/17 [id number]   [facility] to inquire about increasing patient's personal care hours.  Spoke with Fredrik Coveoger in [facility] who stated that a letter would need to be faxed to the doctor to justify need to increase the hours.    This Child psychotherapistsocial worker spoke with patient's son informing him of the process to request additional personal care hours.   Plan:  This Child psychotherapistsocial worker discussed this process with  RNCM who will assist this social worker with request for additional personal care hours.   Adriana ReamsChrystal Land, LCSW [facility] Care Management [phone]

## 2015-08-22 NOTE — Patient Outreach (Signed)
Call made to son to reschedule missed appointment. Message left on son's home and cell number.   Plan: Will await a call back. Will try again in 3 days.  Costella HatcherJanci Courtlynn Holloman RN, BSN  [facility] Care Management [phone])

## 2015-08-23 ENCOUNTER — Other Ambulatory Visit: Payer: Self-pay | Admitting: Pharmacist

## 2015-08-23 NOTE — Patient Outreach (Signed)
Called to follow up with Mr. Andres [name]'s son about getting Mr. Andres [name] [facility] see his provider there. Mr. Andres [name]. Reports that he has called and spoken with the VA and has been calling back and forth with them about getting this set up, but does not have an appointment yet. Reports that he is currently waiting on a call back. Reports that at this appointment he is going to address with the provider both having the patient's medication filled through the [facility] as well as getting the patient hearing aids.   Mr. Andres [name] further reports that he has been working to get his father more in-home care hours paid for through the [place]. Reports that he has been informed that in order to request this, he needs to submit a letter to the [facility] explaining his father's need. Mr. Andres [name] reports that he knows what needs to be in the letter, is working on it and has the fax number to send this letter to.  Encouraged Mr. Andres [name] to continue to follow up with the VA about the provider visit, not just wait for the return call. He stated that he will continue to call. Let Mr. Andres [name] know that I will follow up with him again in another 2 weeks, but to please call me if he is able to get in sooner. Confirmed that he has my contact information.  Duanne MoronElisabeth Kayline Sheer, PharmD Clinical Pharmacist [facility] Care Management [phone]

## 2015-08-30 ENCOUNTER — Other Ambulatory Visit: Payer: Self-pay | Admitting: *Deleted

## 2015-08-30 NOTE — Patient Outreach (Signed)
RNCM was able to make contact with Pt's son to set up f/u appointment to assess pt and talk with his care giver.   Plan: RNCM will visit pt 10/28 at 1:15.  Costella HatcherJanci Mayling Aber RN, BSN  [facility] Care Management [phone])

## 2015-09-06 ENCOUNTER — Other Ambulatory Visit: Payer: Self-pay | Admitting: *Deleted

## 2015-09-06 NOTE — Patient Outreach (Signed)
Subjective Pt stated, " I am scared to get up because I have fallen and I am scared to fall again." "I feel nervous when I am left at home alone, and I am too scared of falling to get up." "I really need a new hearing aide and glucometer." Caregiver stated, "Mr. Veronica just wants to stay in the bed all day, we can't get him to stay up and walk." "We have seen a big decline in him in the last 6 months, he has gotten thinner and just doesn't want to do the things he used to like look at the [place] scores on the computer or go outside."  Objective Blood pressure 100/55, pulse 59, resp. rate 16, SpO2 95 % Blood sugar 157 2 hours post meal. Denies pain.  Assessment Alert and Oriented x3 Overall appearance thin and frail. Extremely hard of hearing. Pt's speech is garbled but can be understood. Answers questions appropriately. Denies any difficulty with swallowing. Lungs clear to auscultation bilat Heart sounds nml, rate bradycardic at 55-59bpm Skin intact Abd flat, bsx4quads, incontinent of stool. Foley cath in place draining clear yellow urine Extremities cool to the touch, pedal pulses +1 bilat Moving all extremities well, ambulated fairly well with walker. Legs noted to be shaky. Able to walk 40 ft to outside deck with walker.  Depression screen [facility] 2/9 09/06/2015 12/17/2014  Decreased Interest 2 0  Down, Depressed, Hopeless 0 0  PHQ - 2 Score 2 0  Altered sleeping 1 -  Tired, decreased energy 1 -  Change in appetite 0 -  Feeling bad or failure about yourself  0 -  Trouble concentrating 1 -  Moving slowly or fidgety/restless 0 -  Suicidal thoughts 0 -  PHQ-9 Score 5 -  Difficult doing work/chores Somewhat difficult -    Fall Risk  09/06/2015 07/12/2015 12/17/2014 12/17/2014 07/14/2013  Falls in the past year? Yes Yes No No No  Number falls in past yr: 2 or more 2 or more - - -  Injury with Fall? No No - - -  Risk Factor Category  High Fall Risk High Fall Risk - - -  Risk for fall due to :  History of fall(s);Impaired balance/gait History of fall(s);Impaired mobility - - -  Follow up Falls prevention discussed Education provided - - -    Pt able to use cell phone lying on his bed if he needed to call someone in an emergency. RNCM discussed with son about obtaining MedAlert and son unwilling to do this.   RNCM came to pt's home to talk with care givers and assess further needs. RNCM met with caregiver Lanny Hurst and Tonia Ghent. Both state they try to encourage pt to get up but he prefers the bed Care givers have been unable to check pt glucose related to no having a meter. At this time b/p is being checked daily by cna. No weights are being monitored, pt is using ensure daily to increase calories. Denies poor appetite. CNA's express concern for Son and DIL related to they work and care for pt during all down time. Pt stated He needed more help in the mornings and on the weekends to give son and DIL a break. Pt requesting a hearing aide if possible. Son requesting assistance with transport to MD office and that way the CNA could go instead of having to take off work.  RNCM educated care givers about checking pt sugar q week, trying to obtain weekly weights, keeping track of pt BM  to ensure he does not have another impaction. Discussed with care givers the use of a gait belt to assist in helping the pt feel more secure with ambulation.   Son maintaining he needed more caregiving hours from the [place]. RNCM discussed assisting him by assisting him compose a letter requesting more caregiver hours. Pt has 4 more children who do not participate in the care of their father.  Plan: RNCM will collaborate with PCP or VA MD related to pt needs: glucometer, hearing aide, and gait belt training for caregivers and pt. RNCM will collaborate with [facility] about pt's increased pcs hours and possible transportation options for this family.  RNCM will develop a sheet caregivers can use to monitor pt stats ie:  vs,wt.i/o,exercise. RNCM plans to see pt in 3 weeks.  Rutherford Limerick RN, BSN  [facility] Care Management [phone])

## 2015-09-09 ENCOUNTER — Encounter: Payer: Self-pay | Admitting: *Deleted

## 2015-09-16 ENCOUNTER — Other Ambulatory Visit: Payer: Self-pay | Admitting: Pharmacist

## 2015-09-16 NOTE — Patient Outreach (Signed)
Called to follow up with Mr. Andres [name]'s son about getting Mr. Andres [name] see his provider there. Mr. Andres [name] reports that he now has the direct number that he needs to call to schedule this appointment with [facility]. [name], but has just not had a chance to do so yet.  Mr. Andres [name] requests the phone number for Nurse Care Manager [name], which I provide. Encouraged Mr. Andres [name]  to follow up with the VA about the provider visit. Mr. Andres [name] reports that he has no further medication questions for me this time and that at this time he and his father do not need any further assistance from me with medication assistance. Confirmed that he has my contact information. Let him know that I will stop following the patient for now, but to please call if they have any questions in the future.  Duanne MoronElisabeth Dhani Dannemiller, PharmD Clinical Pharmacist [facility] Care Management [phone]

## 2015-09-17 ENCOUNTER — Encounter: Payer: Medicare Other | Admitting: Internal Medicine

## 2015-09-20 ENCOUNTER — Other Ambulatory Visit: Payer: Self-pay | Admitting: Internal Medicine

## 2015-09-20 NOTE — Telephone Encounter (Signed)
rx sent to pharmacy by e-script  

## 2015-09-20 NOTE — Telephone Encounter (Signed)
Approved: okay all for a year

## 2015-09-20 NOTE — Telephone Encounter (Signed)
Ok to fill 

## 2015-09-24 ENCOUNTER — Encounter: Payer: Self-pay | Admitting: Emergency Medicine

## 2015-09-24 ENCOUNTER — Telehealth: Payer: Self-pay | Admitting: Internal Medicine

## 2015-09-24 ENCOUNTER — Emergency Department
Admission: EM | Admit: 2015-09-24 | Discharge: 2015-09-24 | Disposition: A | Discharge status: Home / Self Care | Payer: Medicare Other | Attending: Student | Admitting: Student

## 2015-09-24 ENCOUNTER — Telehealth: Payer: Self-pay | Admitting: *Deleted

## 2015-09-24 DIAGNOSIS — T83098A Other mechanical complication of other indwelling urethral catheter, initial encounter: Secondary | ICD-10-CM | POA: Diagnosis not present

## 2015-09-24 DIAGNOSIS — E119 Type 2 diabetes mellitus without complications: Secondary | ICD-10-CM | POA: Insufficient documentation

## 2015-09-24 DIAGNOSIS — Z79899 Other long term (current) drug therapy: Secondary | ICD-10-CM | POA: Diagnosis not present

## 2015-09-24 DIAGNOSIS — E785 Hyperlipidemia, unspecified: Secondary | ICD-10-CM | POA: Diagnosis not present

## 2015-09-24 DIAGNOSIS — R339 Retention of urine, unspecified: Secondary | ICD-10-CM | POA: Diagnosis present

## 2015-09-24 DIAGNOSIS — Z7982 Long term (current) use of aspirin: Secondary | ICD-10-CM | POA: Diagnosis not present

## 2015-09-24 DIAGNOSIS — I129 Hypertensive chronic kidney disease with stage 1 through stage 4 chronic kidney disease, or unspecified chronic kidney disease: Secondary | ICD-10-CM | POA: Diagnosis not present

## 2015-09-24 DIAGNOSIS — Z466 Encounter for fitting and adjustment of urinary device: Secondary | ICD-10-CM | POA: Insufficient documentation

## 2015-09-24 DIAGNOSIS — Z87891 Personal history of nicotine dependence: Secondary | ICD-10-CM | POA: Diagnosis not present

## 2015-09-24 DIAGNOSIS — N183 Chronic kidney disease, stage 3 (moderate): Secondary | ICD-10-CM | POA: Diagnosis not present

## 2015-09-24 NOTE — Telephone Encounter (Signed)
Discussed with son No option now but to bring him to ER  [facility] then [facility] worker should refer him back for home health at the end of respite next time

## 2015-09-24 NOTE — ED Notes (Signed)
Per family he pulled out the foley ..wears a foley cath on a regular basis   it is changed once a month

## 2015-09-24 NOTE — ED Provider Notes (Signed)
[facility] Emergency Department Provider Note  ____________________________________________  Time seen: Approximately 2:44 PM  I have reviewed the triage vital signs and the nursing notes.   HISTORY  Chief Complaint Urinary Retention    HPI Andres [name] is a 81 y.o. male patient here for Foley insertion. Patient wears a Foley was his chin on a regular basis was someone. Patient accidentally pulled Foley out of urethra. Family was unable to get home health come out today to replace the Foley and contact her family doctor who told to report to the emergency room. There is no pain complaint.   Past Medical History  Diagnosis Date  . CAD (coronary artery disease)     a. NSTEMI 09/19/2012 s/p PCI ostial LCx,   . Diverticulitis   . GERD (gastroesophageal reflux disease)   . Hyperlipidemia   . Hypertension   . BPH (benign prostatic hypertrophy)   . Diabetes mellitus   . DJD (degenerative joint disease)     lumbar spine  . IBS (irritable bowel syndrome)   . Stroke (HCC)   . Depression 2012  . Acute MI (HCC) 09/2012  . Chronic kidney disease     ACUTE RENAL FAILURE  . Foley catheter in place 05/09/15    for 1 year per family  . Complete heart block (HCC)     a. s/p PPM     Patient Active Problem List   Diagnosis Date Noted  . Fecal impaction (HCC) 06/26/2015  . Sacral ulcer 06/26/2015  . Pressure ulcer 06/12/2015  . Malnutrition of moderate degree (HCC) 06/12/2015  . UTI (lower urinary tract infection) 06/11/2015  . Chronic diastolic heart failure (HCC) 05/14/2015  . Vascular dementia without behavioral disturbance 05/14/2015  . UTI (urinary tract infection) due to urinary indwelling catheter (HCC) 05/10/2015  . Hyperkalemia 05/10/2015  . Protein-calorie malnutrition, severe (HCC) 05/09/2015  . Preventative health care 12/17/2014  . Advance directive discussed with patient 12/17/2014  . Chronic retention of urine 04/30/2014  . Chronic kidney  disease, stage III (moderate) 10/26/2013  . Pacemaker-Medtronic 06/20/2012  . Episodic mood disorder (HCC)   . CVA 12/01/2010  . SEBORRHEIC DERMATITIS 04/24/2010  . DEGENERATIVE JOINT DISEASE, LUMBAR SPINE 04/18/2009  . AV BLOCK, COMPLETE 04/03/2009  . Late effects of CVA (cerebrovascular accident) 01/14/2009  . ACTINIC KERATOSIS 06/01/2008  . IRRITABLE BOWEL SYNDROME 05/19/2007  . Type 2 diabetes mellitus with renal manifestations, controlled (HCC) 04/04/2007  . HYPERLIPIDEMIA 04/01/2007  . Essential hypertension 04/01/2007  . Coronary atherosclerosis of native coronary artery 04/01/2007  . GERD 04/01/2007  . DIVERTICULOSIS, COLON 04/01/2007  . BPH with urinary obstruction 04/01/2007    Past Surgical History  Procedure Laterality Date  . Angioplasty  1995  . Doppler echocardiography  11/1999    syncope  . Pacemaker insertion      Pacer/cath  (ef 65%)  1/01 -  guidant Discovery II DR pulse generator  . Prostate surgery  07/2003  . Esophagogastroduodenoscopy  07/2005    neg  . Cardiac catheterization  09/2012    NSTEMI/STENT PLACED  . Insert / replace / remove pacemaker      [id number]    Current Outpatient Rx  Name  Route  Sig  Dispense  Refill  . acetaminophen (TYLENOL) 325 MG tablet   Oral   Take 2 tablets (650 mg total) by mouth every 6 (six) hours as needed for mild pain (or Fever >/= 101).         Marland Kitchen. aspirin EC  81 MG tablet   Oral   Take 81 mg by mouth daily.         Marland Kitchen atorvastatin (LIPITOR) 10 MG tablet   Oral   Take 10 mg by mouth daily.          . bisacodyl (BISACODYL) 5 MG EC tablet   Oral   Take 1 tablet (5 mg total) by mouth daily as needed for moderate constipation.   14 tablet   0   . citalopram (CELEXA) 10 MG tablet      Take 1 tablet by mouth  daily   90 tablet   3   . clopidogrel (PLAVIX) 75 MG tablet      Take 1 tablet by mouth  daily   90 tablet   3   . feeding supplement, ENSURE ENLIVE, (ENSURE ENLIVE) LIQD   Oral   Take  237 mLs by mouth 3 (three) times daily with meals.   237 mL   12   . finasteride (PROSCAR) 5 MG tablet      Take 1 tablet by mouth  daily   90 tablet   3   . GLIPIZIDE XL 5 MG 24 hr tablet      Take 1 tablet by mouth  daily   90 tablet   3   . isosorbide mononitrate (IMDUR) 30 MG 24 hr tablet      Take 1 tablet by mouth  daily   90 tablet   3   . lisinopril (PRINIVIL,ZESTRIL) 5 MG tablet      Take 1 tablet by mouth  daily   90 tablet   3   . metFORMIN (GLUCOPHAGE) 1000 MG tablet      Take one-half tablet by  mouth two times daily with  meals   90 tablet   3   . metoprolol succinate (TOPROL-XL) 25 MG 24 hr tablet   Oral   Take 1 tablet (25 mg total) by mouth daily.   90 tablet   3   . Multiple Vitamin (MULTIVITAMIN WITH MINERALS) TABS tablet   Oral   Take 1 tablet by mouth daily.         . nitroGLYCERIN (NITROSTAT) 0.4 MG SL tablet   Sublingual   Place 0.4 mg under the tongue every 5 (five) minutes as needed for chest pain.          Marland Kitchen senna-docusate (SENOKOT-S) 8.6-50 MG per tablet   Oral   Take 1 tablet by mouth 2 (two) times daily.   60 tablet   0   . triamcinolone cream (KENALOG) 0.1 %   Topical   Apply 1 application topically 2 (two) times daily as needed. For itchy areas Patient taking differently: Apply 1 application topically 2 (two) times daily as needed (for dry skin).    30 g   2     Allergies Review of patient's allergies indicates no known allergies.  Family History  Problem Relation Age of Onset  . Diabetes Sister     Social History Social History  Substance Use Topics  . Smoking status: Former Smoker    Types: Cigarettes  . Smokeless tobacco: Never Used  . Alcohol Use: No     Comment: heavy in the past    Review of Systems Constitutional: No fever/chills Eyes: No visual changes. ENT: No sore throat. Cardiovascular: Denies chest pain. Respiratory: Denies shortness of breath. Gastrointestinal: No abdominal pain.  No  nausea, no vomiting.  No diarrhea.  No constipation.  Genitourinary: Negative for dysuria. Musculoskeletal: Negative for back pain. Skin: Negative for rash. Neurological: Negative for headaches, focal weakness or numbness. Endocrine: Hyperlipidemia, diabetes, and hypertension 10-point ROS otherwise negative.  ____________________________________________   PHYSICAL EXAM:  VITAL SIGNS: ED Triage Vitals  Enc Vitals Group     BP 09/24/15 1410 123/58 mmHg     Pulse Rate 09/24/15 1410 59     Resp 09/24/15 1410 20     Temp 09/24/15 1410 98.2 F (36.8 C)     Temp Source 09/24/15 1410 Oral     SpO2 09/24/15 1410 98 %     Weight 09/24/15 1410 119 lb (53.978 kg)     Height --      Head Cir --      Peak Flow --      Pain Score 09/24/15 1411 0     Pain Loc --      Pain Edu? --      Excl. in GC? --     Constitutional: Alert and oriented. Well appearing and in no acute distress. Eyes: Conjunctivae are normal. PERRL. EOMI. Head: Atraumatic. Nose: No congestion/rhinnorhea. Mouth/Throat: Mucous membranes are moist.  Oropharynx non-erythematous. Neck: No stridor. No cervical spine tenderness to palpation. Hematological/Lymphatic/Immunilogical: No cervical lymphadenopathy. Cardiovascular: Normal rate, regular rhythm. Grossly normal heart sounds.  Good peripheral circulation. Respiratory: Normal respiratory effort.  No retractions. Lungs CTAB. Gastrointestinal: Soft and nontender. No distention. No abdominal bruits. No CVA tenderness. Genitourinary: No pain medial lesion no bleeding from the meatus. Musculoskeletal: No lower extremity tenderness nor edema.  No joint effusions. Neurologic:  Normal speech and language. No gross focal neurologic deficits are appreciated. No gait instability. Skin:  Skin is warm, dry and intact. No rash noted. Psychiatric: Mood and affect are normal. Speech and behavior are normal.  ____________________________________________   LABS (all labs ordered are  listed, but only abnormal results are displayed)  Labs Reviewed - No data to display ____________________________________________  EKG   ____________________________________________  RADIOLOGY   ____________________________________________   PROCEDURES  Procedure(s) performed: None  Critical Care performed: No  ____________________________________________   INITIAL IMPRESSION / ASSESSMENT AND PLAN / ED COURSE  Pertinent labs & imaging results that were available during my care of the patient were reviewed by me and considered in my medical decision making (see chart for details).  Foley catheter replacement. No Foley was inserted with clear return of approximately 100 mL of urine. Patient is charged with instructions for Foley catheter care and advised to follow-up with her treating doctor. ____________________________________________   FINAL CLINICAL IMPRESSION(S) / ED DIAGNOSES  Final diagnoses:  Encounter for Foley catheter replacement      Joni Reining, PA-C 09/24/15 1502  Gayla Doss, MD 09/24/15 1708

## 2015-09-24 NOTE — Telephone Encounter (Signed)
Andres [name] @ [facility] called wanting you to call her back ASAP  Regarding Andres [name] pulling out his foley catheter. [name]

## 2015-09-24 NOTE — Telephone Encounter (Signed)
[facility]oke with [facility] and all they need is a new order sent in, and per the Operations manager that could not get out there today, they possibly can go next week. She spoke with son and he is wanting someone out there TODAY.

## 2015-09-24 NOTE — Telephone Encounter (Signed)
Spoke with son again and I can not find a urologist in pt's chart, per son pt is going to make a mess all over everything if someone doesn't come out soon. Son doesn't know who pt's urologist is either. Please advise

## 2015-09-24 NOTE — Telephone Encounter (Signed)
Son is calling because his father's catheter came out last night and he need it reinserted. [facility] and [facility] pt was discharged on 08/05/2015 because he was in respite, per [facility] they have to have all new new orders and he would be like a new pt. Son is upset and doesn't understand.

## 2015-09-24 NOTE — Telephone Encounter (Signed)
Verbally spoke with Dr. Alphonsus SiasLetvak and he suggested that the son call pt's urologist on emergency and tell them the catheter came out and they need to have it replaced.

## 2015-09-24 NOTE — ED Notes (Addendum)
Pt to ed with c/o urinary retention.  Pt care giver states pt pulled his catheter out during the night.   Reports he has catheter that stays in all the time at home. Attempted to get home health out today for replacement catheter but they were unable to do so.

## 2015-09-24 NOTE — Telephone Encounter (Signed)
Spoke with Florentina AddisonKatie and advised results

## 2015-09-25 ENCOUNTER — Ambulatory Visit: Payer: Medicare Other | Admitting: Internal Medicine

## 2015-09-25 ENCOUNTER — Encounter: Payer: Self-pay | Admitting: Internal Medicine

## 2015-09-25 VITALS — BP 104/78 | HR 60 | Resp 16

## 2015-09-25 DIAGNOSIS — I5032 Chronic diastolic (congestive) heart failure: Secondary | ICD-10-CM

## 2015-09-25 DIAGNOSIS — E441 Mild protein-calorie malnutrition: Secondary | ICD-10-CM

## 2015-09-25 DIAGNOSIS — N183 Chronic kidney disease, stage 3 unspecified: Secondary | ICD-10-CM

## 2015-09-25 DIAGNOSIS — I25119 Atherosclerotic heart disease of native coronary artery with unspecified angina pectoris: Secondary | ICD-10-CM

## 2015-09-25 DIAGNOSIS — E1122 Type 2 diabetes mellitus with diabetic chronic kidney disease: Secondary | ICD-10-CM | POA: Diagnosis not present

## 2015-09-25 DIAGNOSIS — F39 Unspecified mood [affective] disorder: Secondary | ICD-10-CM

## 2015-09-25 DIAGNOSIS — T83021S Displacement of indwelling urethral catheter, sequela: Secondary | ICD-10-CM | POA: Diagnosis not present

## 2015-09-25 DIAGNOSIS — F0150 Vascular dementia without behavioral disturbance: Secondary | ICD-10-CM

## 2015-09-25 MED ORDER — METOPROLOL SUCCINATE ER 25 MG PO TB24: 25.0000 mg | ORAL_TABLET | Freq: Every day | ORAL | Status: AC

## 2015-09-25 NOTE — Assessment & Plan Note (Signed)
Lab Results  Component Value Date   HGBA1C 6.4* 05/09/2015   Good control without hypoglycemia Will stop glipizide if low sugars

## 2015-09-25 NOTE — Assessment & Plan Note (Signed)
No angina recently on isosorbide Hasn't needed the nitro Will change back to succinate---by mistake on metop. tartrate

## 2015-09-25 NOTE — Assessment & Plan Note (Signed)
Lab Results  Component Value Date   CREATININE 1.42* 06/30/2015   stable

## 2015-09-25 NOTE — Assessment & Plan Note (Signed)
Eating well but doesn't gain weight Not checking weights

## 2015-09-25 NOTE — Assessment & Plan Note (Signed)
Replaced at ER yesterday Will get back on home health Son now knows that he needs referral back to home health after respite stay in SNF

## 2015-09-25 NOTE — Assessment & Plan Note (Signed)
Variable functional status Has daily aides Doing okay living with son for now

## 2015-09-25 NOTE — Assessment & Plan Note (Signed)
In bed again now without clear reason Wouldn't increase the citalopram though--not clearly due to increased depression

## 2015-09-25 NOTE — Assessment & Plan Note (Signed)
No fluid issues now Not exacerbated

## 2015-09-25 NOTE — Progress Notes (Signed)
Subjective:    Patient ID: Andres [name], male    DOB: Jan 07, 1926, 81 y.o.   MRN: [id number]  HPI Home visit for follow up of chronic medical conditions Son and caregiver are here  Had spell of a good few weeks Up on his own Walking around house Now back in bed mostly for past week  Caregivers ~6 hours per day in 2 shifts Needs help with dressing and bathing Help for toilet--but often will just be incontinent in bed No trouble with constipation  BP checked regularly by aides 95/46, 114/51 No chest pain, SOB, dizziness  Catheter fell out yesterday AM Home nurses stopped---he had brief SNF stay for respite a couple weeks ago and home care not restarted Had to go to ER yesterday--catheter back in  Not clearly more depressed Just seems more tired lately  Not checking sugars No apparent hypoglycemic spells  Current Outpatient Prescriptions on File Prior to Visit  Medication Sig Dispense Refill  . acetaminophen (TYLENOL) 325 MG tablet Take 2 tablets (650 mg total) by mouth every 6 (six) hours as needed for mild pain (or Fever >/= 101).    Marland Kitchen. aspirin EC 81 MG tablet Take 81 mg by mouth daily.    Marland Kitchen. atorvastatin (LIPITOR) 10 MG tablet Take 10 mg by mouth daily.     . bisacodyl (BISACODYL) 5 MG EC tablet Take 1 tablet (5 mg total) by mouth daily as needed for moderate constipation. 14 tablet 0  . citalopram (CELEXA) 10 MG tablet Take 1 tablet by mouth  daily 90 tablet 3  . clopidogrel (PLAVIX) 75 MG tablet Take 1 tablet by mouth  daily 90 tablet 3  . feeding supplement, ENSURE ENLIVE, (ENSURE ENLIVE) LIQD Take 237 mLs by mouth 3 (three) times daily with meals. 237 mL 12  . finasteride (PROSCAR) 5 MG tablet Take 1 tablet by mouth  daily 90 tablet 3  . GLIPIZIDE XL 5 MG 24 hr tablet Take 1 tablet by mouth  daily 90 tablet 3  . isosorbide mononitrate (IMDUR) 30 MG 24 hr tablet Take 1 tablet by mouth  daily 90 tablet 3  . lisinopril (PRINIVIL,ZESTRIL) 5 MG tablet Take 1 tablet by  mouth  daily 90 tablet 3  . metFORMIN (GLUCOPHAGE) 1000 MG tablet Take one-half tablet by  mouth two times daily with  meals 90 tablet 3  . metoprolol succinate (TOPROL-XL) 25 MG 24 hr tablet Take 1 tablet (25 mg total) by mouth daily. 90 tablet 3  . Multiple Vitamin (MULTIVITAMIN WITH MINERALS) TABS tablet Take 1 tablet by mouth daily.    . nitroGLYCERIN (NITROSTAT) 0.4 MG SL tablet Place 0.4 mg under the tongue every 5 (five) minutes as needed for chest pain.     Marland Kitchen. senna-docusate (SENOKOT-S) 8.6-50 MG per tablet Take 1 tablet by mouth 2 (two) times daily. 60 tablet 0  . triamcinolone cream (KENALOG) 0.1 % Apply 1 application topically 2 (two) times daily as needed. For itchy areas (Patient taking differently: Apply 1 application topically 2 (two) times daily as needed (for dry skin). ) 30 g 2   No current facility-administered medications on file prior to visit.    No Known Allergies  Past Medical History  Diagnosis Date  . CAD (coronary artery disease)     a. NSTEMI 09/19/2012 s/p PCI ostial LCx,   . Diverticulitis   . GERD (gastroesophageal reflux disease)   . Hyperlipidemia   . Hypertension   . BPH (benign prostatic hypertrophy)   .  Diabetes mellitus   . DJD (degenerative joint disease)     lumbar spine  . IBS (irritable bowel syndrome)   . Stroke (HCC)   . Depression 2012  . Acute MI (HCC) 09/2012  . Chronic kidney disease     ACUTE RENAL FAILURE  . Foley catheter in place 05/09/15    for 1 year per family  . Complete heart block (HCC)     a. s/p PPM     Past Surgical History  Procedure Laterality Date  . Angioplasty  1995  . Doppler echocardiography  11/1999    syncope  . Pacemaker insertion      Pacer/cath  (ef 65%)  1/01 -  guidant Discovery II DR pulse generator  . Prostate surgery  07/2003  . Esophagogastroduodenoscopy  07/2005    neg  . Cardiac catheterization  09/2012    NSTEMI/STENT PLACED  . Insert / replace / remove pacemaker      [id number]     Family History  Problem Relation Age of Onset  . Diabetes Sister     Social History   Social History  . Marital Status: Widowed    Spouse Name: N/A  . Number of Children: 5  . Years of Education: N/A   Occupational History  . 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 Smoking status: Former Smoker    Types: Cigarettes  . Smokeless tobacco: Never Used  . Alcohol Use: No     Comment: heavy in the past  . Drug Use: No  . Sexual Activity: No   Other Topics Concern  . Not on file   Social History Narrative   Lives with son and DIL   Son is health care POA   He would accept attempts at resuscitation   Not sure about tube feeds---but probably wouldn't want this   Review of Systems Hearing aides not working well--trying to get new ones through [place] Eating well--no recent weight Sleeps okay No recent pain issues    Objective:   Physical Exam  Constitutional: No distress.  In bed Can't hear much but answers shouted questions  Neck: Normal range of motion. Neck supple. No thyromegaly present.  Cardiovascular: Normal rate, regular rhythm and normal heart sounds.  Exam reveals no gallop.   No murmur heard. Pulmonary/Chest: Effort normal and breath sounds normal. No respiratory distress. He has no wheezes. He has no rales.  Abdominal: Soft.  Slightly sensitive but no clear tenderness  Musculoskeletal: He exhibits no edema or tenderness.  Lymphadenopathy:    He has no cervical adenopathy.  Psychiatric:  Mild psychomotor retardation          Assessment & Plan:

## 2015-09-26 ENCOUNTER — Other Ambulatory Visit: Payer: Self-pay | Admitting: *Deleted

## 2015-09-26 NOTE — Patient Outreach (Signed)
[facility] Care Management  09/26/2015  Andres [name] 04-14-1926 [id number]   Collaboration [facility]  Andres [name] who agrees to assist patient's son with a letter to [facility] to request an extension of personal care hours provided [facility].   Adriana ReamsChrystal Francyne Arreaga, LCSW [facility] Care [facility] [phone]

## 2015-09-30 ENCOUNTER — Ambulatory Visit: Payer: Medicare Other | Admitting: *Deleted

## 2015-10-07 ENCOUNTER — Other Ambulatory Visit: Payer: Self-pay | Admitting: *Deleted

## 2015-10-07 NOTE — Patient Outreach (Signed)
RNCM contacted Jorene Guestiane Stinson [facility] Delford FieldWright to request guidance on having Mr. Lynelle DoctorWright's in home care increased. Ms. Adrian BlackwaterStinson made RNCM aware of the difference between the aide and attendance benefit and the personal care benefit that Mr. Delford FieldWright is receiving. Ms Adrian BlackwaterStinson directed me to call the head of homecare/homemaker services and gave RNCM the direct #.  RNCM placed a call to Shalamar Humes [phone]. Ms. Jobe GibbonHumes explained that the pt is receiving the maximum benefit of 2 hours per day for 5 days a week. Ms Jobe GibbonHumes stated the only reason the pt would be eligible for increased hours would be if it was taking longer than 2hours per day to give pt a bath and complete ADLS. The aide caring for the pt each day would have to make his supervisor aware that it was taking longer than 2 hours and this supervisor would need to contact the [place]. RNCM talked with Ms. Humes about pt's situation and she suggested a [facility] adult daycare program to assist with the pt's care. Ms Jobe GibbonHumes was going to provide RNCM a number to a program close to pt's area. RNCM is to call back in 30 minutes for this information.   Plan: RNCM will call back for additional resources in 30 minutes.  Costella HatcherJanci Seamus Warehime RN, BSN  [facility] Care [facility] [phone])

## 2015-10-07 NOTE — Patient Outreach (Signed)
[facility]) Care Management   10/07/2015  Andres [name] 10/10/1926 [id number]  Andres [name] is an 81 y.o. male  Subjective: "I am scared to get up because I may fall." "When I am alone I stay in bed because I don't want to fall, I feel safe in bed."  Son, "I would be willing for dad to go to an adult daycare if the [place] provided the service and it was a nice place."  Objective: Blood pressure 100/48, pulse 67, resp. rate 16, SpO2 90 % on room air.  Review of Systems  HENT: Positive for hearing loss.     Physical Exam  Constitutional: He appears cachectic.  Cardiovascular: Normal rate and regular rhythm.   Pulses:      Dorsalis pedis pulses are 1+ on the right side, and 1+ on the left side.  Respiratory: Breath sounds normal.  GI:  Pt noted to be very thin.  Genitourinary:  Foley catheter in place  Neurological: He is alert.  Skin: Skin is warm, dry and intact.     Pt with skin intact to bilateral feet. Feet cool to touch.   Psychiatric: He has a normal mood and affect. His behavior is normal. Thought content normal. His speech is slurred. He exhibits abnormal recent memory.      Assessment: RNCM arrived and son was and caregiver were present. Son and RNCM discussed benefits provided trough the [place] noting the pt was obtaining the maximum amount of pcs and aide and attendance pension allowed. RNCM discussed with son the next option available could be an adult daycare who provides services for seniors while their caregivers worked. The son stated he was open to this. Also talked with son about obtaining med alert for pt since pt was left alone several hours a week alone according to son and caregivers. Son stated he was afraid his dad would not be able to understand only to push the button in an emergency. Med alert brochure left with son to think about. Son made [facility] aware he had taken the pt to the [place] to receive his free hearing aides. Son states there have been  no medication changes since RNCM's last visit.   RNCM assessed pt. Pt had a headset that he was using to amplify sounds and was able to hear RNCM and answered questions appropriately. Pt continues to look frail and cachectic. There is no scale present in the home to weigh pt according to son and caregiver. Recent weight at ED on 09/24/15 is recorded 119lbs. This weight is not clear whether it is stated or actual. Caregiver Ida reports pt's appetite good. Pt denies loss of appetite. RNCM asked pt if he felt scared when left in the home alone and he stated he just stays in the bed and doesn't get up so he won't fall. Pt does have a cell phone at bedside to use for emergencies and was able to operate phone during RNCM's last visit.    RNCM provided pt with glucometer, gait belt and log book to record b/p's and blood sugars. RNCM instructed caregiver on how to check pt's blood sugar. RNCM showed caregiver where in the logbook to record blood sugar, blood pressure and also to keep up with pt's bm's to prevent pt from becoming impacted. RNCM also suggested to caregiver to circle in the calendar when pt's foley had been changed. RNCM educated caregiver on the importance of pt getting up for meals and not be in a  reclining position related to previous stroke. Caregiver Ida verbalized understanding. RNCM gave caregiver her business card in case she had further questions.    Plan: Plan RNCM will collaborate with SW about the possibility of VA funded adult day programs in our area.   Aundray Cartlidge RN, BSN  [facility] Care Management [phone])  THN CM Care Plan Problem One        Most Recent Value   Care Plan Problem One  Adult failure to thrive as evidenced by weight loss, BMI=18 and multiple hospital admits   Role Documenting the Problem One  Care Management [place] for Problem One  Active   THN Long Term Goal (31-90 days)  Pt will not be readmitted to the hospital in the next 90 days.   THN Long  Term Goal Start Date  08/01/15   Interventions for Problem One Long Term Goal  Initial home visit by [facility] to assess pt and caregiver situation. RNCM discussed with son ideas to increase pt strength.     THN CM Short Term Goal #1 (0-30 days)  Pt will spend more time out of bed than in bed during the day in the next 30 days.   THN CM Short Term Goal #1 Start Date  08/01/15   [facility] CM Short Term Goal #1 Met Date  09/06/15   Interventions for Short Term Goal #1  RNCM talked with Son about the importance of pt staying out of bed related to strength and skin integrity. RNCM discused with pt the reason he does not want to get up.  RNCM plans to  make a home visit with daily care givers present.   THN CM Short Term Goal #2 (0-30 days)  Pt will gain 5 pounds in the next 30 days.   THN CM Short Term Goal #2 Start Date  09/06/15 [goal unmet restarted]   THN CM Short Term Goal #2 Met Date  -- [Unable to monitor goal related to no scale present ]   Interventions for Short Term Goal #2  RNCM requested Son to obtain a scale to monitor pt's weight. Nutrition discussed with son, cupons given for ensure, reinforced MD plan to add icecream to increase calories.    THN CM Short Term Goal #3 (0-30 days)  Caregiver will begin to monitor pt's blood sugar weekly and record in log book for the next 30days.   THN CM Short Term Goal #3 Start Date  10/07/15   Interventions for Short Tern Goal #3  New glucometer given to pt. RNCM educated caregiver on using glucometer. RNCM demonstrated proper technique.    THN CM Short Term Goal #4 (0-30 days)  Pt will ambulate with caregiver twice a day and caregiver wil record exercise in logbook for the next 30 days.   THN CM Short Term Goal #4 Start Date  10/07/15   Interventions for Short Term Goal #4  Educated pt on the importance of exercise to maintain strength. Provided pt  with a gait belt to assist pt in feeling more secure with ambulation. RNCM instructed caregiver in the use of gait  belt to prevent falls.

## 2015-10-09 ENCOUNTER — Other Ambulatory Visit: Payer: Self-pay | Admitting: *Deleted

## 2015-10-09 MED ORDER — GLUCOSE BLOOD VI STRP: ORAL_STRIP | Status: AC

## 2015-10-29 ENCOUNTER — Other Ambulatory Visit: Payer: Self-pay | Admitting: *Deleted

## 2015-10-29 NOTE — Patient Outreach (Signed)
[facility]) Care Management  10/29/2015  Andres IdolJoseph J [name] 1926/01/18 [id number]    Collaboration phone call with RNCM [name] to discuss patient's current social work needs. It has been confirmed that patient receives the maximum amount [facility] and further hours would have to be requested by his current aid( [facility] who would then request the increase through his supervisor.  Adult Day Care options have been discussed with patient's son [name], who will re-visit this topic during her next visit on 11/06/15. RNCM has also been informed that patient's [facility].   Plan: This Child psychotherapistsocial worker will plan to close patient's case to social work at this time as it has been confirmed that patient is receiving the maximum amount of [facility] care hours [facility].  He has also [facility] as well.  RNCM  [name] has agreed to re-visit Adult Day Care options with patient's son  during next home visit on 11/06/15.   Adriana ReamsChrystal Land, LCSW [facility] Care [facility] [phone]    Adriana ReamsChrystal Land, LCSW [facility] Care [facility] [phone]

## 2015-11-05 ENCOUNTER — Telehealth: Payer: Self-pay | Admitting: Internal Medicine

## 2015-11-05 NOTE — Telephone Encounter (Signed)
Spoke with son and he states it's been over a month and wanted to know when [facility] would be coming out to [facility]'s [facility] , per [facility] will have someone out to see pt next week.

## 2015-11-05 NOTE — Telephone Encounter (Signed)
Andres [name] with [facility] called  cb number is [phone] Thank you

## 2015-11-06 ENCOUNTER — Other Ambulatory Visit: Payer: Self-pay | Admitting: *Deleted

## 2015-11-06 ENCOUNTER — Encounter: Payer: Self-pay | Admitting: *Deleted

## 2015-11-06 NOTE — Patient Outreach (Signed)
[facility]) Care Management   11/06/2015  Andres [name] July 10, 1926 [id number]  Andres [name] is an 81 y.o. male  Subjective: "I just feel so weak and tired, I don't want to get up."   Caregiver " Mr. Marczak will get up to take his bath but will not stay up after that. He wants to stay in bed all the time now." "His appetite has been good I don't know why he is losing weight." Son: "I can see Dad getting more weak and tired. I can see a change in him."  Objective: Blood pressure 107/51, pulse 60, resp. rate 16, height 1.727 m (_0 ), weight 110 lb (49.896 kg), SpO2 92 %. BMI 16.8. Blood sugar 131, 2 hours post meal. Review of Systems  Constitutional: Positive for weight loss and malaise/fatigue.    Physical Exam  Constitutional: He is oriented to person, place, and time. He appears cachectic.  Cardiovascular: Normal heart sounds.   Pulses:      Radial pulses are 2+ on the right side, and 2+ on the left side.       Dorsalis pedis pulses are 0 on the right side, and 0 on the left side.  Pacer visible under the skin to left chest   Respiratory: Effort normal and breath sounds normal.  GI: Soft. Bowel sounds are normal.  Genitourinary:  Indwelling catheter in place  Musculoskeletal: Normal range of motion.  Neurological: He is alert and oriented to person, place, and time.  Skin: Skin is dry and intact. Bruising noted. There is pallor.  Bilateral feet with skin intact unable to palpate pedal pulses, bilat feet cool to touch even with socks on.   Psychiatric: He has a normal mood and affect. His behavior is normal. His speech is slurred.    Current Medications:   Current Outpatient Prescriptions  Medication Sig Dispense Refill  . acetaminophen (TYLENOL) 325 MG tablet Take 2 tablets (650 mg total) by mouth every 6 (six) hours as needed for mild pain (or Fever >/= 101).    Marland Kitchen aspirin EC 81 MG tablet Take 81 mg by mouth daily.    Marland Kitchen atorvastatin (LIPITOR) 10 MG  tablet Take 10 mg by mouth daily.     . bisacodyl (BISACODYL) 5 MG EC tablet Take 1 tablet (5 mg total) by mouth daily as needed for moderate constipation. 14 tablet 0  . citalopram (CELEXA) 10 MG tablet Take 1 tablet by mouth  daily 90 tablet 3  . clopidogrel (PLAVIX) 75 MG tablet Take 1 tablet by mouth  daily 90 tablet 3  . finasteride (PROSCAR) 5 MG tablet Take 1 tablet by mouth  daily 90 tablet 3  . GLIPIZIDE XL 5 MG 24 hr tablet Take 1 tablet by mouth  daily 90 tablet 3  . glucose blood (ONETOUCH VERIO) test strip Use as instructed to test blood sugar once daily dx: E11.29 100 each 1  . isosorbide mononitrate (IMDUR) 30 MG 24 hr tablet Take 1 tablet by mouth  daily 90 tablet 3  . lisinopril (PRINIVIL,ZESTRIL) 5 MG tablet Take 1 tablet by mouth  daily 90 tablet 3  . metFORMIN (GLUCOPHAGE) 1000 MG tablet Take one-half tablet by  mouth two times daily with  meals 90 tablet 3  . metoprolol succinate (TOPROL-XL) 25 MG 24 hr tablet Take 1 tablet (25 mg total) by mouth daily. 90 tablet 3  . Multiple Vitamin (MULTIVITAMIN WITH MINERALS) TABS tablet Take 1 tablet by mouth daily.    Marland Kitchen  nitroGLYCERIN (NITROSTAT) 0.4 MG SL tablet Place 0.4 mg under the tongue every 5 (five) minutes as needed for chest pain.     Marland Kitchen senna-docusate (SENOKOT-S) 8.6-50 MG per tablet Take 1 tablet by mouth 2 (two) times daily. 60 tablet 0  . triamcinolone cream (KENALOG) 0.1 % Apply 1 application topically 2 (two) times daily as needed. For itchy areas (Patient taking differently: Apply 1 application topically 2 (two) times daily as needed (for dry skin). ) 30 g 2   No current facility-administered medications for this visit.    Functional Status:   In your present state of health, do you have any difficulty performing the following activities: 09/06/2015 07/12/2015  Hearing? [name]  Vision? Y N  Difficulty concentrating or making decisions? [name]  Walking or climbing stairs? Y -  Dressing or bathing? Y Y  Doing errands,  shopping? [name]  Preparing Food and eating ? Y N  Using the Toilet? Y Y  In the past six months, have you accidently leaked urine? Y N  Do you have problems with loss of bowel control? Y Y  Managing your Medications? Y Y  Managing your Finances? [name]  Housekeeping or managing your Housekeeping? [name]    Fall/Depression Screening:    PHQ 2/9 Scores 09/06/2015 12/17/2014  PHQ - 2 Score 2 0  PHQ- 9 Score 5 -  . Fall Risk  09/06/2015 07/12/2015 12/17/2014 12/17/2014 07/14/2013  Falls in the past year? Yes Yes No No No  Number falls in past yr: 2 or more 2 or more - - -  Injury with Fall? No No - - -  Risk Factor Category  High Fall Risk High Fall Risk - - -  Risk for fall due to : History of fall(s);Impaired balance/gait History of fall(s);Impaired mobility - - -  Follow up Falls prevention discussed Education provided - - -    Assessment: When RNCM arrived pt was resting in bed. Both caregivers were at the home, Lanny Hurst from [facility] agency and Tonia Ghent who is privately hired by the family. RNCM inquired about pt's b/p and blood sugars and weights over the last few weeks and both care givers reported they had not been doing the care. RNCM assisted Lanny Hurst CNA get pt to the bathroom scale. Pt tolerated well but immediately wanted to lie down afterwards. RNCM watched Tonia Ghent demonstrate how to obtain pt's blood sugar and observed good technique. Instructed caregivers again to obtain blood pressures daily, sugars and weight weekly. Both caregivers verbalized understanding.  Pt noted to be pale and frail looking. Both caregivers report pt has a good appetite, but according to past records pt continuing to lose weight.  Prior to home visit RNCM spoke with Malachy Mood at [facility] [place] for a recommendation related to pt's home health/palliative care. Her recommendation was to have pt evaluated for Hospice care so that the trained hospice staff could recommend [facility] vs hospice care. RNCM  will talk with MD about this. RNCM did discuss this option with pt's son and son was agreeable to this plan. Son stating he has noticed a decline in his Dad. Son did [employer] to see pt this week to change foley. RNCM took signed inquiry by [facility] Lose office to ask for assistance with VA concerns, per pt and son's permission. This inquiry requested help with gaining more funds or hours related to pt's at home care.   Plan: RNCM will  see pt in 3 weeks to make sure weights, sugars and VS are being done as requested. RNCM will talk with primary care MD about pt's current weight loss and decline in energy. RNCM will also make MD aware of the recommendation of [facility] representative Malachy Mood for a Hospice evaluation.   Regan Mcbryar RN, BSN  [facility] Care Management [phone])      THN CM Care Plan Problem One        Most Recent Value   Care Plan Problem One  Adult failure to thrive as evidenced by weight loss, BMI=18 and multiple hospital admits   Role Documenting the Problem One  Care Management [place] for Problem One  Active   THN Long Term Goal (31-90 days)  Pt will not be readmitted to the hospital in the next 90 days.   THN Long Term Goal Start Date  08/01/15   [facility] Long Term Goal Met Date  11/06/15   Interventions for Problem One Long Term Goal  Initial home visit by [facility] to assess pt and caregiver situation. RNCM discussed with son ideas to increase pt strength.     THN CM Short Term Goal #1 (0-30 days)  Pt will spend more time out of bed than in bed during the day in the next 30 days.   THN CM Short Term Goal #1 Start Date  08/01/15   [facility] CM Short Term Goal #1 Met Date  09/06/15   Interventions for Short Term Goal #1  RNCM talked with Son about the importance of pt staying out of bed related to strength and skin integrity. RNCM discused with pt the reason he does not want to get up.  RNCM plans to  make a home visit with daily care  givers present.   [facility]N CM Short Term Goal #2 (0-30 days)  Pt will gain 5 pounds in the next 30 days.   [facility] CM Short Term Goal #2 Start Date  09/06/15 [goal unmet restarted]   [facility]N CM Short Term Goal #2 Met Date  -- [pt has lost weight]   Interventions for Short Term Goal #2  RNCM requested Son to obtain a scale to monitor pt's weight. Nutrition discussed with son, cupons given for ensure, reinforced MD plan to add icecream to increase calories.    [facility]N CM Short Term Goal #3 (0-30 days)  Caregiver will begin to monitor pt's blood sugar weekly and record in log book for the next 30days.   [facility] CM Short Term Goal #3 Start Date  11/06/15 Barrie Folk restarted related to caregivers did not follow recomend]   Interventions for Short Tern Goal #3  New glucometer given to pt. RNCM educated caregiver on using glucometer. RNCM demonstrated proper technique.    [facility]N CM Short Term Goal #4 (0-30 days)  Pt will ambulate with caregiver twice a day and caregiver wil record exercise in logbook for the next 30 days.   [facility]N CM Short Term Goal #4 Start Date  10/07/15   [facility] CM Short Term Goal #4 Met Date  -- [Unable to complete this goal.]   Interventions for Short Term Goal #4  Educated pt on the importance of exercise to maintain strength. Provided pt  with a gait belt to assist pt in feeling more secure with ambulation. RNCM instructed caregiver in the use of gait belt to prevent falls.     [facility] CM Care Plan Problem Two        Most Recent Value  Care Plan Problem Two  Pt losing weight and has had a decline in energy.    Role Documenting the Problem Two  Care Management [place] for Problem Two  Active   Interventions for Problem Two Long Term Goal   RNCM contacted [facility] for recomemendations concerning pt's care RNCM discussed hospice options with son and son verbalized agreement.    THN Long Term Goal (31-90) days  Pt will be evaluated for hospice care in the next 31 days.   THN Long Term Goal Start Date   11/06/15

## 2015-11-07 ENCOUNTER — Telehealth: Payer: Self-pay | Admitting: Internal Medicine

## 2015-11-07 NOTE — Telephone Encounter (Signed)
[name] called and asked for a call back. Pt is going to require a face to face visit for medicare. Does pt need to be seen urgently or can this wait? The best number to contact her is [phone].

## 2015-11-07 NOTE — Telephone Encounter (Signed)
Spoke with hospice nurse and sent over updated notes and weights for patient and recent ECHO.

## 2015-11-07 NOTE — Telephone Encounter (Signed)
Please call her My last visit he was already failing and wouldn't leave the bed He has worsened and lost ~5% of his body weight since last being seen ([facility] nurse who has been following him)  Shouldn't need a face to face from me with his overall condition and decline Can't they go in and evaluate him and decide--- I am okay with starting [facility] if not hospice eligible but I think he is hospice eligible  This is not an emergency but I was hoping we could get him going by next week

## 2015-11-08 ENCOUNTER — Other Ambulatory Visit: Payer: Self-pay | Admitting: Internal Medicine

## 2015-11-25 ENCOUNTER — Ambulatory Visit: Payer: Self-pay | Admitting: *Deleted

## 2015-11-25 ENCOUNTER — Other Ambulatory Visit: Payer: Self-pay | Admitting: *Deleted

## 2015-11-25 DIAGNOSIS — I2510 Atherosclerotic heart disease of native coronary artery without angina pectoris: Secondary | ICD-10-CM

## 2015-11-25 DIAGNOSIS — I442 Atrioventricular block, complete: Secondary | ICD-10-CM

## 2015-11-25 DIAGNOSIS — I5032 Chronic diastolic (congestive) heart failure: Secondary | ICD-10-CM | POA: Diagnosis not present

## 2015-11-25 DIAGNOSIS — I110 Hypertensive heart disease with heart failure: Secondary | ICD-10-CM

## 2015-11-25 NOTE — Patient Outreach (Signed)
RNCM called pt's son to reschedule home visit and follow up related to Hospice referral. HIPAA compliant voicemail left.  Plan: Will await a return call.   Will attempt a return call tomorow.   Costella HatcherJanci Annjanette Wertenberger RN, BSN  [facility] Care Management [phone])

## 2015-11-27 ENCOUNTER — Telehealth: Payer: Self-pay | Admitting: Internal Medicine

## 2015-11-27 NOTE — Telephone Encounter (Signed)
va needs a letter stating mr madeira is homebound and bed ridden.  With this letter they will increase income.  This will help the family.  Mr Sher's son is staying home to care for his dad right now They want to know why he is homebound and condition

## 2015-11-28 ENCOUNTER — Other Ambulatory Visit: Payer: Self-pay | Admitting: *Deleted

## 2015-11-28 ENCOUNTER — Encounter: Payer: Self-pay | Admitting: Internal Medicine

## 2015-11-28 DIAGNOSIS — Z7689 Persons encountering health services in other specified circumstances: Secondary | ICD-10-CM

## 2015-11-28 NOTE — Patient Outreach (Signed)
[facility]) Care Management  11/28/2015  Andres [name] 08-Oct-1926 [id number]   RNCM received word from hospice that pt had been admitted to their service. RNCM had already scheduled a home visit with pt so stopped by pt's home to say goodbye to pt and family. RNCM talked with son [name] and he was pleased with Hospice transition and stated he was extremely happy that pt was able to qualify for hospice. RNCM let son know that his case was now closed to case management. Son thanked this [facility] assistance. RNCM spoke with pt briefly and he indicated he was having trouble with stomach pains. RNCM instructed son [name] to call Hospice RN to assist with pt's complaints and confirmed son had the number. Son was calling as RNCM left the home.   Plan: RNCM will close this pt's case.   Costella Hatcher RN, BSN  [facility] Care Management [phone])

## 2015-11-28 NOTE — Telephone Encounter (Signed)
Letter done--in my box. $20 charge

## 2015-11-29 NOTE — Telephone Encounter (Signed)
Son aware letter is ready for pick up  Copy for pt Copy for billing

## 2015-12-09 ENCOUNTER — Telehealth: Payer: Self-pay | Admitting: *Deleted

## 2015-12-09 NOTE — Telephone Encounter (Signed)
Please see if action taken on this. If not sick--just send urinalysis and culture If sick, we can try an antibiotic while we await the culture (but his last culture showed Candida so tough to decide what to start him on)

## 2015-12-09 NOTE — Telephone Encounter (Signed)
Left message on machine for Andres [name] to return my call

## 2015-12-09 NOTE — Telephone Encounter (Signed)
[facility] Night - Client TELEPHONE ADVICE RECORD [facility] Medical Call Center Patient Name: Andres [name] Gender: Male DOB: Aug 01, 1926 Age: 82 Y 8 M 17 D Return Phone Number: Address: City/State/Zip: [place] [facility] Night - Client Client Site [facility] - Night Physician [place] [facility] Contact Type Call Call Type Page Only Caller Name Morrie Sheldon w/ [facility] [place] [phone] Relationship To Patient Care Giver Is this call to report lab results? No Return Phone Number Please choose phone number Initial Comment Caller states patient has a UTI, thick cloudy urine, has strong odor, and patient has confusion. Would like an order for u/a with culture. Maybe an antibiotic . [facility] w/ [facility] [phone] Nurse Assessment Guidelines Guideline Title Affirmed Question Affirmed Notes Nurse Date/Time Lamount Cohen Time) Disp. Time Lamount Cohen Time) Disposition Final User 12/08/2015 10:18:32 AM Send to [facility] Paging Queue Marolyn Hammock 12/08/2015 10:27:03 AM Paged On Call to Other Provider Neta Ehlers 12/08/2015 10:27:53 AM Paged On Call to Other Provider Yes Neta Ehlers Reason: Paged On Call to Other Provider After Care Instructions Given Call Event Type User Date / Time Description Paging DoctorName Phone DateTime Result/Outcome Message Type Notes Evelena Peat [phone] 12/08/2015 10:27:03 AM Paged On Call to Other Provider Doctor Paged [facility] Alphonsus Sias patient has a UTI, thick cloudy urine, has strong odor, and patient has confusion. Would like an order for UA with culture. Maybe an antibiotic. CB# For [facility]: [phone] [facility] # for [facility] Answering service: [phone] Evelena Peat 12/08/2015 10:27:12 AM Paged On Call to Another Provider Message Result

## 2015-12-10 MED ORDER — NITROFURANTOIN MACROCRYSTAL 100 MG PO CAPS: 100.0000 mg | ORAL_CAPSULE | Freq: Four times a day (QID) | ORAL | Status: DC

## 2015-12-10 NOTE — Telephone Encounter (Signed)
Spoke with son Not doing well Will be moving to [facility] to live---discussed transferring care to house physician there  Urinalysis shows clumps of WBC in catheterized patient Will start empiric macrodantin (since both bacteria in last culture were at least partially sensitive to this)

## 2015-12-12 ENCOUNTER — Telehealth: Payer: Self-pay | Admitting: Internal Medicine

## 2015-12-12 DIAGNOSIS — M6281 Muscle weakness (generalized): Secondary | ICD-10-CM | POA: Diagnosis not present

## 2015-12-12 MED ORDER — CEFUROXIME AXETIL 250 MG PO TABS: 250.0000 mg | ORAL_TABLET | Freq: Two times a day (BID) | ORAL | Status: AC

## 2015-12-12 NOTE — Telephone Encounter (Signed)
Urine culture is indeterminate for nitrofurantoin (Klebsiella) Please call son and hospice-- we need to change to a different antibiotic and I will send it to [facility] again and ask for delivery

## 2015-12-12 NOTE — Telephone Encounter (Signed)
Spoke with hospice triage and son to let them know about the new medication.  Per son pt will be moving to [place] [employer] today, everything was approved

## 2015-12-12 NOTE — Telephone Encounter (Signed)
Okay.  Thanks for the update

## 2015-12-13 ENCOUNTER — Encounter: Payer: Self-pay | Admitting: Internal Medicine

## 2015-12-17 ENCOUNTER — Encounter: Payer: Medicare Other | Admitting: Internal Medicine

## 2016-01-08 DEATH — deceased

## 2017-02-14 IMAGING — CT CT HEAD W/O CM
1 series · 16 of 30 positions shown, 20 images · non-contrast
Comparison: 03/02/2013

CLINICAL DATA: Altered mental status.  Generalized weakness.

EXAM:
CT HEAD WITHOUT CONTRAST
TECHNIQUE: Contiguous axial images were obtained from the base of the skull
through the vertex without intravenous contrast.

[Series 2: head wo · axial · 0.41mm/px · z∈[-167,-23]mm · 16 of 36 slices shown, 20 images]
[im 2/36  brain]
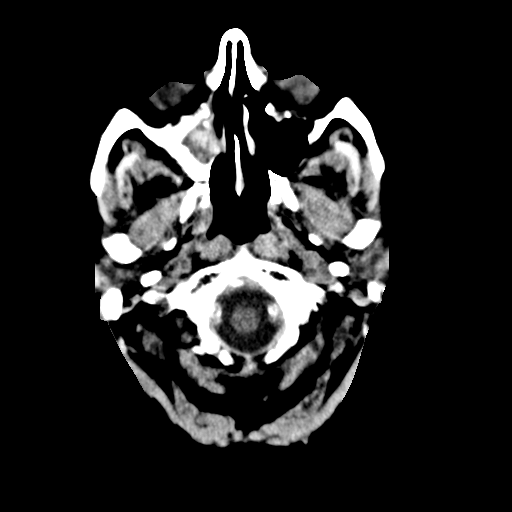
[im 2/36  bone]
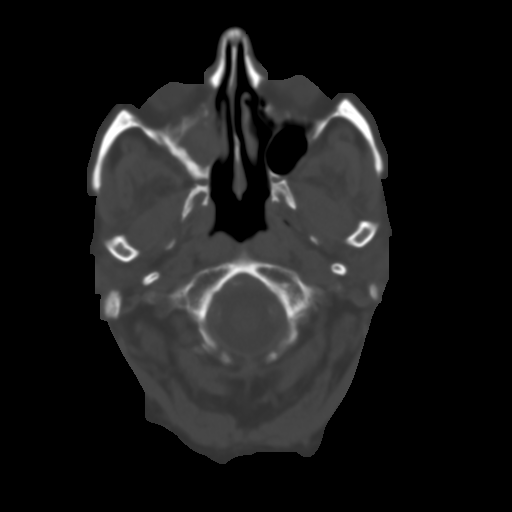
[im 4/36  brain]
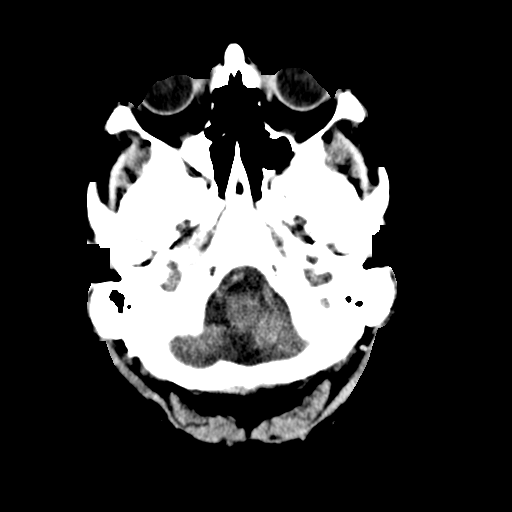
[im 7/36  brain]
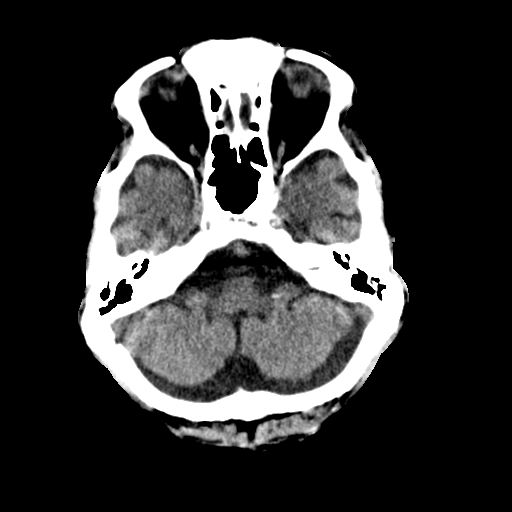
[im 9/36  brain]
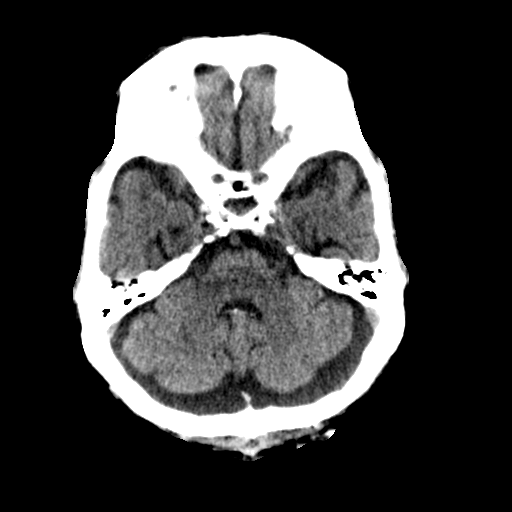
[im 10/36  brain]
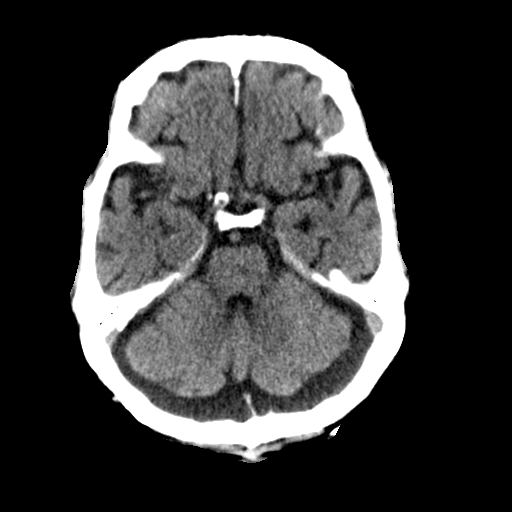
[im 10/36  bone]
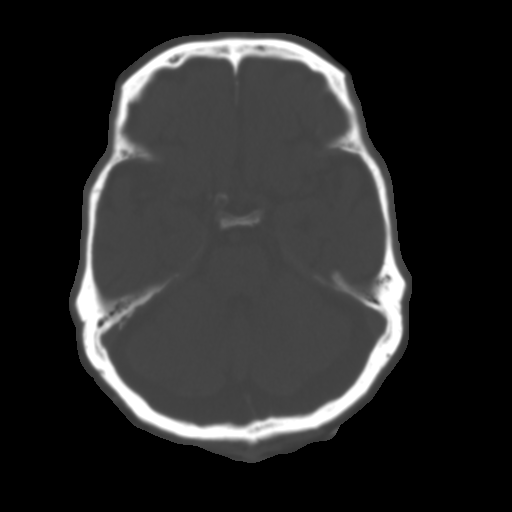
[im 13/36  brain]
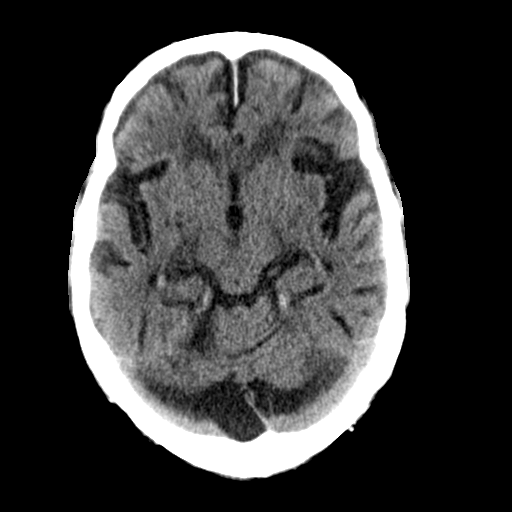
[im 15/36  brain]
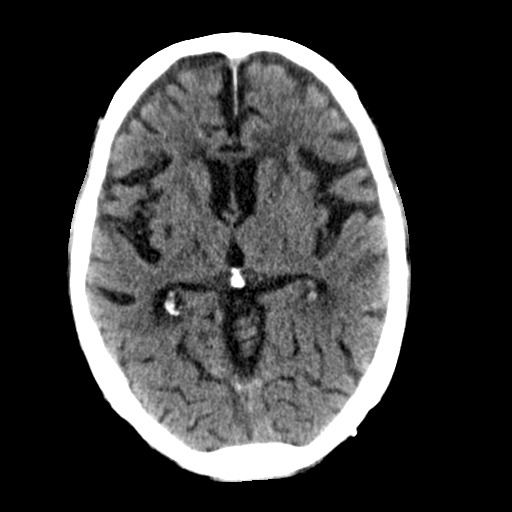
[im 17/36  brain]
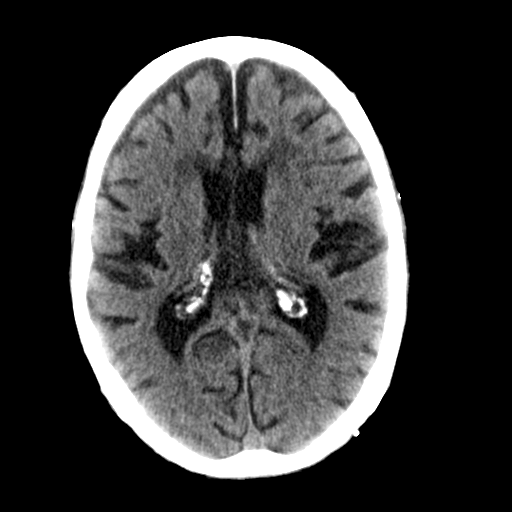
[im 19/36  brain]
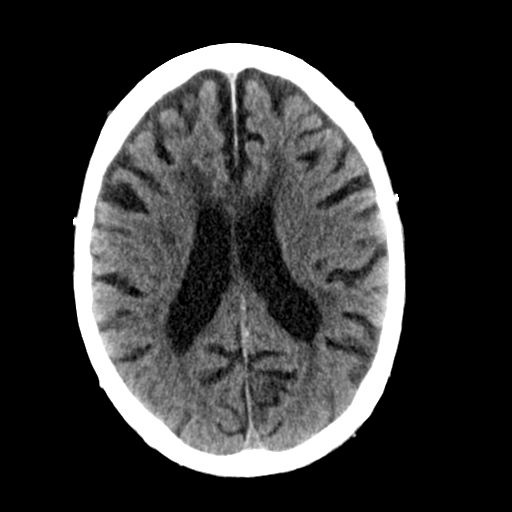
[im 19/36  bone]
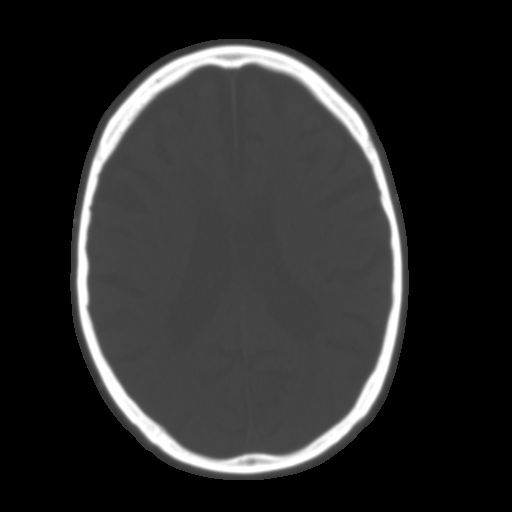
[im 21/36  brain]
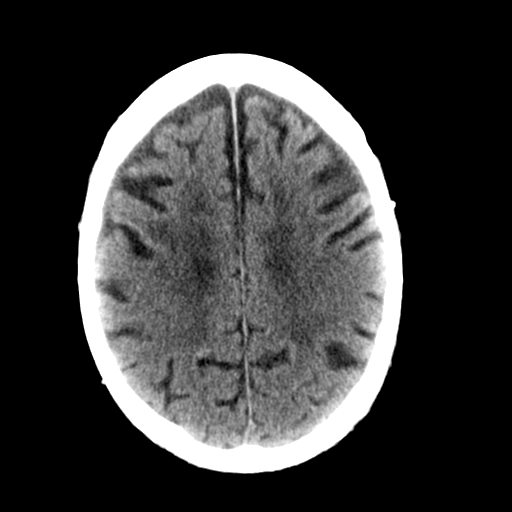
[im 23/36  brain]
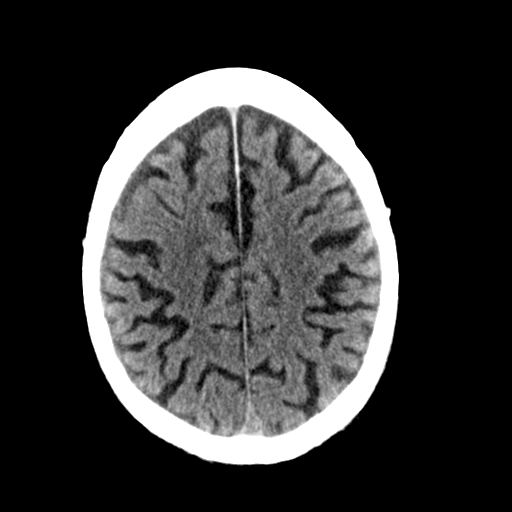
[im 26/36  brain]
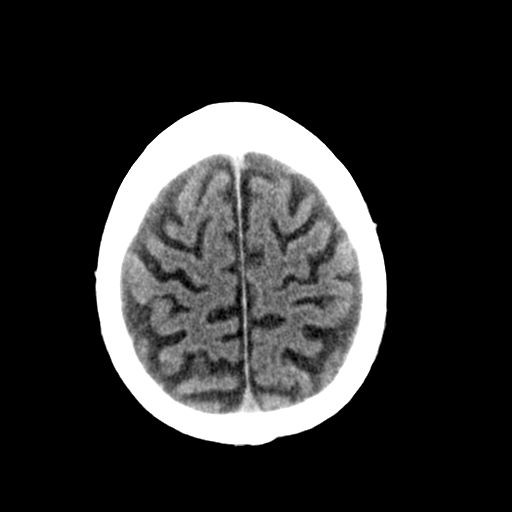
[im 27/36  brain]
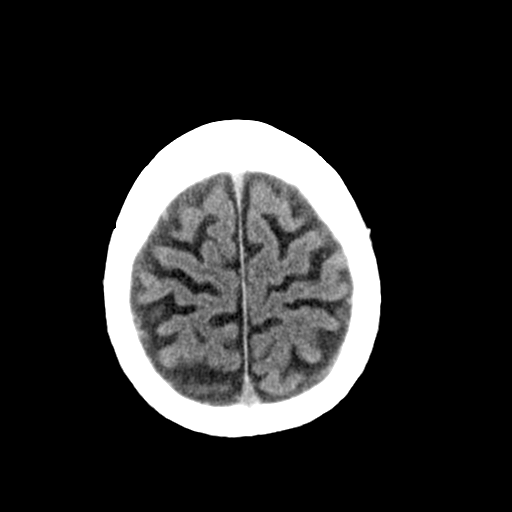
[im 27/36  bone]
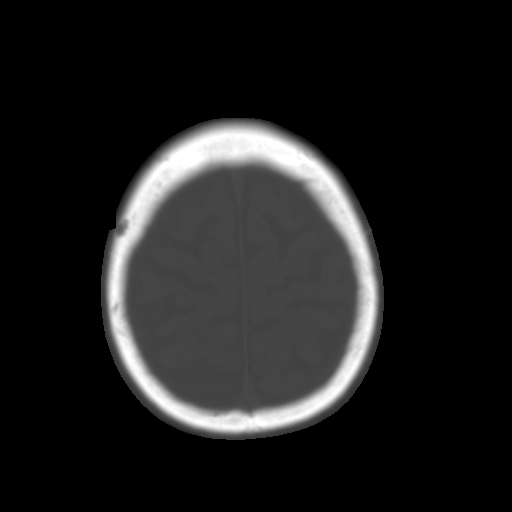
[im 29/36  brain]
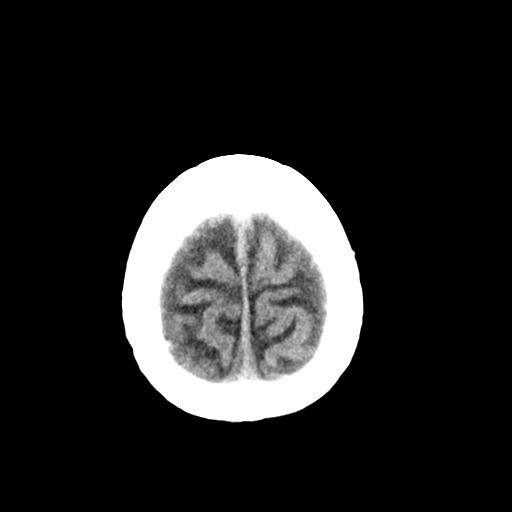
[im 32/36  brain]
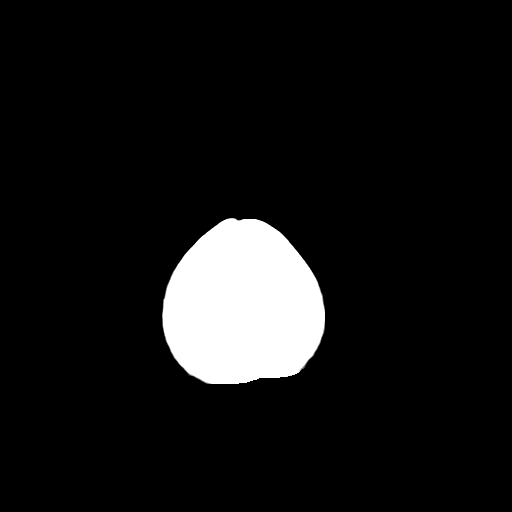
[im 34/36  brain]
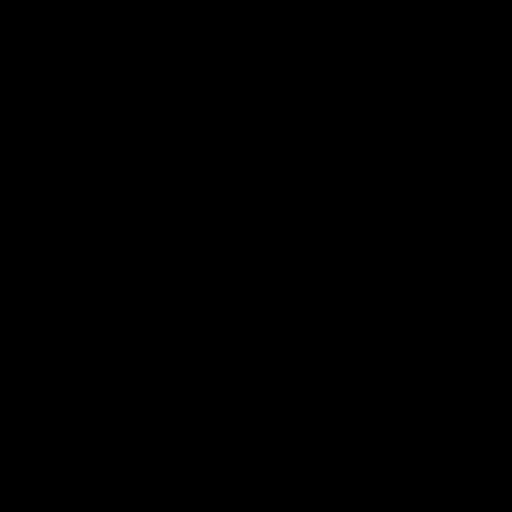

[16 of 30 positions shown; findings below may reference images not displayed]

FINDINGS: There is no intracranial hemorrhage, mass or evidence of acute
infarction. There is no extra-axial fluid collection. There is
moderately severe cerebral and cerebellar atrophy. There is white
matter hypodensity consistent with chronic microvascular disease.
There are chronic lacunar infarctions involving the left caudate,
left anterior lentiform nuclei, right subinsular region. No interval
change is evident.

There is chronic opacification of the right maxillary sinus with
associated bony sclerosis. No bony destruction is evident. This is
also unchanged.
IMPRESSION: Moderately severe generalized atrophy and chronic microvascular
disease. Chronic lacunar infarctions in the basal ganglia. No acute
intracranial findings.

Chronic right maxillary sinus opacification.

## 2017-03-03 IMAGING — CR DG CHEST 1V PORT
1 series · 1 of 1 positions shown · non-contrast
Comparison: 05/12/2015 and 03/02/2013

CLINICAL DATA: Shortness of breath.  Nausea and vomiting.

EXAM:
PORTABLE CHEST - 1 VIEW

[ap]
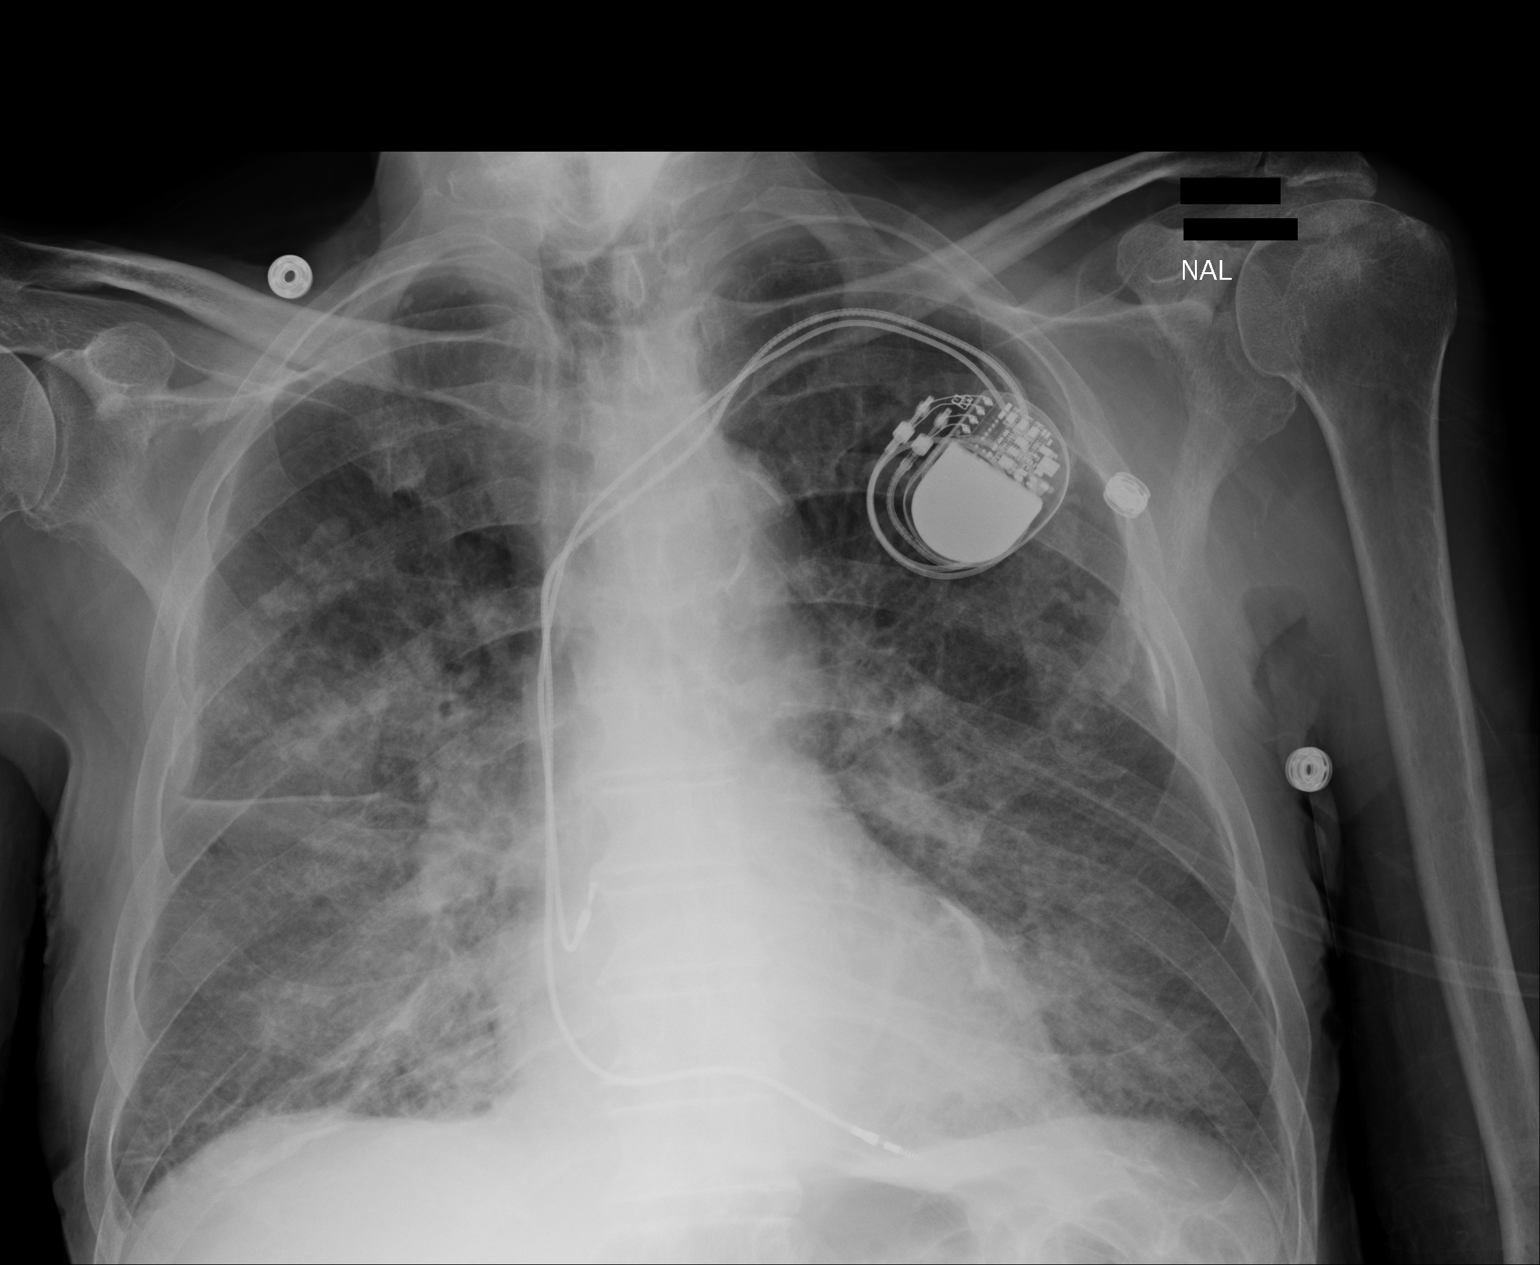

[1 of 1 positions shown; findings below may reference images not displayed]

FINDINGS: The patient has hazy bilateral diffuse pulmonary infiltrates with
Kerley B-lines the bases consistent with pulmonary edema. Heart size
and pulmonary vascularity are normal. Calcification in the arch of
the aorta. Pacemaker in place. No acute osseous abnormality.

No appreciable pleural effusions. Pleural calcifications
bilaterally.
IMPRESSION: Bilateral pulmonary edema. Pleural calcifications suggest previous
asbestos exposure.
